# Patient Record
Sex: Female | Born: 1950 | Race: White | Hispanic: No | Smoking: Never smoker
Health system: Southern US, Community
[De-identification: ages and names within clinical notes are randomized; demographics above are authoritative.]

## PROBLEM LIST (undated history)

## (undated) DIAGNOSIS — K589 Irritable bowel syndrome without diarrhea: Secondary | ICD-10-CM

## (undated) DIAGNOSIS — G4730 Sleep apnea, unspecified: Secondary | ICD-10-CM

## (undated) DIAGNOSIS — E039 Hypothyroidism, unspecified: Secondary | ICD-10-CM

## (undated) DIAGNOSIS — R519 Headache, unspecified: Secondary | ICD-10-CM

## (undated) DIAGNOSIS — R053 Chronic cough: Secondary | ICD-10-CM

## (undated) DIAGNOSIS — F329 Major depressive disorder, single episode, unspecified: Secondary | ICD-10-CM

## (undated) DIAGNOSIS — E119 Type 2 diabetes mellitus without complications: Secondary | ICD-10-CM

## (undated) DIAGNOSIS — K219 Gastro-esophageal reflux disease without esophagitis: Secondary | ICD-10-CM

## (undated) DIAGNOSIS — R05 Cough: Secondary | ICD-10-CM

## (undated) DIAGNOSIS — F32A Depression, unspecified: Secondary | ICD-10-CM

## (undated) DIAGNOSIS — R609 Edema, unspecified: Secondary | ICD-10-CM

## (undated) DIAGNOSIS — H9190 Unspecified hearing loss, unspecified ear: Secondary | ICD-10-CM

## (undated) DIAGNOSIS — I10 Essential (primary) hypertension: Secondary | ICD-10-CM

## (undated) DIAGNOSIS — M1990 Unspecified osteoarthritis, unspecified site: Secondary | ICD-10-CM

## (undated) HISTORY — PX: TONSILLECTOMY: SUR1361

## (undated) HISTORY — PX: TUBAL LIGATION: SHX77

## (undated) HISTORY — PX: HAND SURGERY: SHX662

## (undated) HISTORY — PX: GASTRIC BYPASS: SHX52

## (undated) HISTORY — PX: EYE SURGERY: SHX253

## (undated) HISTORY — PX: SHOULDER ARTHROSCOPY: SHX128

---

## 2005-06-07 ENCOUNTER — Emergency Department: Payer: Self-pay | Admitting: Emergency Medicine

## 2009-12-04 ENCOUNTER — Observation Stay: Payer: Self-pay | Admitting: Internal Medicine

## 2014-03-05 ENCOUNTER — Ambulatory Visit (INDEPENDENT_AMBULATORY_CARE_PROVIDER_SITE_OTHER): Payer: Managed Care, Other (non HMO) | Admitting: Podiatry

## 2014-03-05 ENCOUNTER — Encounter: Payer: Self-pay | Admitting: Podiatry

## 2014-03-05 VITALS — BP 114/80 | HR 99 | Resp 16 | Ht 66.0 in | Wt 260.0 lb

## 2014-03-05 DIAGNOSIS — B351 Tinea unguium: Secondary | ICD-10-CM

## 2014-03-05 NOTE — Patient Instructions (Signed)

## 2014-03-05 NOTE — Progress Notes (Signed)
   Subjective:    Patient ID: Kaitlin Kelley, female    DOB: 11-Mar-1951, 63 y.o.   MRN: [id number]  HPI Comments: Patient presents the office today with complaints of fungal toenails on both of her big toes. She states the nails are loose. Fungus his been present for approximately 2 years it has been progressive. She has been using kerasal on the nails. She previously had the nails removed the proximal 2 years ago by a podiatrist in [place]. Denies any drainage, redness or other signs of infection. No other complaints at this time. Denies any claudication symptoms.    Review of Systems  Constitutional: Positive for fatigue.       Sweating   HENT: Positive for ear pain and sinus pressure.        Ringing in ears   Endocrine: Positive for heat intolerance.       Excessive thirst  Genitourinary: Positive for urgency and frequency.  Musculoskeletal:       Joint pain Back pain  Skin:       Open sores  Hematological:       Slow to heal  Psychiatric/Behavioral: The patient is nervous/anxious.        Objective:   Physical Exam AAO x3, NAD DP/PT pulses palpable b/l. CRT < 3 sec Protective sensation intact with the Semmes Weinstein monofilament, vibratory sensation intact. B/L hallux toenails are hypertrophic, dystrophic, elongated, yellow-brown discoloration. There is lifting of the nail plate and nailbed distally. The proximal aspect of the nail plate is adhered. There is no surrounding erythema, drainage or other clinical signs of infection. No open lesions.       Assessment & Plan:  63 year old female with onychomycosis to b/l hallux nails -Conservative versus surgical intervention was discussed with the patient in detail including alternatives, risks, complications. -At this time the patient wishes to have the loose nail debrided and no invasive procedure. -Loose nail sharply debrided to bilateral hallux without complications. Underlying skin intact. Proximal aspect of the nail  adhered. -Various treatments for nail fungus discussed with the patient in detail. Patient wishes to proceed with kerasal.  -Followup as needed or if there are any change in symptoms.

## 2014-03-12 ENCOUNTER — Encounter: Payer: Self-pay | Admitting: Podiatry

## 2014-06-23 ENCOUNTER — Ambulatory Visit: Payer: Self-pay | Admitting: Ophthalmology

## 2014-06-23 LAB — BASIC METABOLIC PANEL
ANION GAP: 9 (ref 7–16)
BUN: 29 mg/dL — ABNORMAL HIGH (ref 7–18)
CO2: 23 mmol/L (ref 21–32)
CREATININE: 1.41 mg/dL — AB (ref 0.60–1.30)
Calcium, Total: 8.9 mg/dL (ref 8.5–10.1)
Chloride: 102 mmol/L (ref 98–107)
EGFR (African American): 48 — ABNORMAL LOW
EGFR (Non-African Amer.): 40 — ABNORMAL LOW
Glucose: 237 mg/dL — ABNORMAL HIGH (ref 65–99)
Osmolality: 282 (ref 275–301)
Potassium: 4.3 mmol/L (ref 3.5–5.1)
SODIUM: 134 mmol/L — AB (ref 136–145)

## 2014-06-24 ENCOUNTER — Ambulatory Visit: Payer: Self-pay | Admitting: Ophthalmology

## 2014-08-14 ENCOUNTER — Ambulatory Visit: Payer: Self-pay | Admitting: Ophthalmology

## 2014-08-14 LAB — POTASSIUM: POTASSIUM: 4 mmol/L (ref 3.5–5.1)

## 2014-08-20 ENCOUNTER — Ambulatory Visit: Payer: Self-pay | Admitting: Ophthalmology

## 2014-09-10 ENCOUNTER — Ambulatory Visit: Payer: Self-pay | Admitting: Ophthalmology

## 2014-10-31 NOTE — Op Note (Signed)
PATIENT NAME:  Kaitlin Kelley, Kaitlin Kelley MR#:  [id number] DATE OF BIRTH:  1950/12/11  DATE OF PROCEDURE:  06/24/2014   PREPROCEDURE DIAGNOSIS: Rhegmatogenous retinal detachment, left eye.  POSTOPERATIVE DIAGNOSIS:  Rhegmatogenous retinal detachment, left eye.    PROCEDURE:  A 25-gauge pars plana vitrectomy with air-fluid exchange endo-laser and 20 626% SF6.  ANESTHESIA:  MAC with retrobulbar block.   COMPLICATIONS: None.   BLOOD LOSS: Minimal from.   PROCEDURE NOTE:  [name] was evaluated in clinic, the day prior to surgery for a rhegmatogenous retinal detachment in the left eye.  The risks, benefits, alternatives, and complications were discussed with her and her husband and she elected to proceed with pars plana vitrectomy in the left eye. On the day of surgery, the patient was greeted in the preoperative holding area. The left eye was marked. Any questions were answered. The patient was brought into the Operating Room in supine position and MAC was administered. A  retrobulbar block of lidocaine and Marcaine plain with Wydase was injected. The left eye was then prepped and draped in usual sterile fashion. Three 25-gauge trocars were placed in the usual position. The infusion cannula was checked to ensure it was in the vitreous cavity prior to starting the infusion. A core vitrectomy was performed followed by peripheral shortening of the vitreous for 360 degrees.  The detachment was very full so care was taken not to engage the  retina during this process the vitreous also removed from the retinal break of which there was one large and multiple small superior breaks. The breaks were then marked and a 360 degree scleral depressed exam was performed. There were no additional breaks noted. The soft tip cannula was then used to drain the subretinal fluid with a fluid-fluid exchange and then with an air-fluid exchange and the detachment was drained flat and Endo laser probe was then used to laser around  all of the detachments and an area previous laser  just inferiorly to the detachment.  Twenty-six percent SF6 was then infused 4 times the vitreous volume. The two superior wounds were then sutured shut with 7-0 Vicryl and the third trocar was removed with no leak of gas. The pressure was felt to be acceptable by palpation. Subconjunctival block and cefuroxime and dexamethasone were injected.  The patient was then patched and shielded with Neo-Poly-Dex ointment and taken to the recovery area in stable condition.    ____________________________ Princella PellegriniJessica D. Duke Salviaandolph, MD jdr:at D: 06/24/2014 08:44:26 ET T: 06/24/2014 14:24:00 ET JOB#: [id number]  cc: Shanda BumpsJessica D. Duke Salviaandolph, MD, <Dictator> Elisabeth Strom D  MD ELECTRONICALLY SIGNED 07/08/2014 10:10

## 2014-11-08 NOTE — Op Note (Signed)
PATIENT NAME:  Kaitlin Kelley, [name] MR#:  [id number] DATE OF BIRTH:  Mar 18, 1951  DATE OF PROCEDURE:  09/10/2014  DIAGNOSIS CODE: Is H25.11, which is a nuclear sclerotic cataract of the right eye.   PREOPERATIVE DIAGNOSIS:  Nuclear sclerotic cataract, right eye.  POSTOPERATIVE DIAGNOSIS:  Nuclear sclerotic cataract, right eye.  PROCEDURE:  Phacoemulsification with posterior chamber intraocular lens right eye, model SN60WF  SURGEON:  Lia HoppingNisha Brendolyn Stockley, MD  INDICATIONS:  This is a 64 year old female with decreased vision in the right eye.  PROCEDURE:  The risks and benefits of cataract surgery were discussed at length with the patient, including bleeding, infection, retinal detachment, re-operation, diplopia, ptosis, loss of vision, and loss of the eye. Informed consent was obtained. On the day of surgery, several sets of preoperative medication were administered to the operative eye including 0.5% tetracaine,1% cyclopentolate, 10% phenylephrine, 0.5% ketorolac, 0.5% gatifloxacin, and 2% lidocaine .  The patient was taken to the operating room and sedated via IV sedation. Topical tetracaine was placed in the eye. The operative eye was prepped using a diluted 10% Betadine solution and then covered in sterile drapes leaving only the operative eye exposed. A Lieberman lid speculum was placed to provide exposure. Using 0.12 forceps and a sideport blade, a paracentesis was created. Then a mixture of BSS, preservative free lidocaine, and epinephrine was injected into the anterior chamber. Next, a 2.4 mm keratome blade was used to create a two-step full-thickness clear corneal incision temporally. The cystitome and Utrata forceps were used to create a continuous capsulorrhexis in the anterior lens capsule. BSS on a hydrodissection cannula was used to perform gentle hydrodissection. Phacoemulsification was then performed to remove the nucleus. Irrigation and aspiration was performed to remove the remaining cortical  material. Provisc was injected to fill the capsular bag and anterior chamber. A 12.0 diopter SN60WF intraocular lens was injected into the capsular bag. The Connor wand was used to rotate it into proper position in the capsular bag. Irrigation and aspiration was performed to remove the remaining Viscoelastic material from the eye. BSS on a 30-gauge cannula was used to hydrate the wound. The main wound was found to be leaking and so, one 10-0 nylon suture was used to reapproximate the main wound. An intracameral antibiotic was administered. The wounds were checked and found to be watertight. The lid speculum and drapes were carefully removed. Several drops of Vigamox were placed in the operative eye. The eye was covered with protective eyewear. The patient was taken to the recovery area in good condition. There were no complications.   ____________________________ Lia HoppingNisha Field Staniszewski, MD nm:nt D: 09/10/2014 11:28:02 ET T: 09/10/2014 20:25:38 ET JOB#: [id number]  cc: Lia HoppingNisha Lucerito Rosinski, MD, <Dictator> Lia HoppingNISHA Henri Baumler MD ELECTRONICALLY SIGNED 09/17/2014 17:13

## 2014-11-08 NOTE — Op Note (Signed)
PATIENT NAME:  Kaitlin Kelley, Kaitlin Kelley MR#:  [id number] DATE OF BIRTH:  04/04/1951  DATE OF PROCEDURE:  08/20/2014  PREOPERATIVE DIAGNOSES:   1. Nuclear sclerotic cataract, left eye; diagnosis code 825.13. 2. Poor visualization.   POSTOPERATIVE DIAGNOSES:   1. Nuclear sclerotic cataract, left eye; diagnosis code 825.13. 2. Poor visualization.   PROCEDURE:  Phacoemulsification with posterior chamber intraocular lens left eye, model SN60WF  ANESTHESIA: Topical.   SURGEON:  Lia HoppingNisha Vennela Jutte, MD  INDICATIONS:  This is a 64 year old female with decreased vision in the left eye.  PROCEDURE:  The risks and benefits of cataract surgery were discussed at length with the patient, including bleeding, infection, retinal detachment, re-operation, diplopia, ptosis, loss of vision, and loss of the eye. Informed consent was obtained. On the day of surgery, several sets of preoperative medication were administered to the operative eye including 0.5% tetracaine,1% cyclopentolate, 10% phenylephrine, 0.5% ketorolac, 0.5% gatifloxacin, and 2% lidocaine .  The patient was taken to the operating room and sedated via IV sedation. Topical tetracaine was placed in the eye. The operative eye was prepped using a diluted 10% Betadine solution and then covered in sterile drapes leaving only the operative eye exposed. A Lieberman lid speculum was placed to provide exposure. Using 0.12 forceps and a sideport blade, a paracentesis was created. Due to poor visualization due to mature cataract with diffuse posterior subcapsular opacities, VisionBlue was then injected into the anterior chamber after a filtered air bubble was injected to ensure adequate visualization of the anterior capsule. Then a mixture of BSS, preservative free lidocaine, and epinephrine was injected into the anterior chamber. Next, a 2.4 mm keratome blade was used to create a two-step full-thickness clear corneal incision temporally. The cystitome and Utrata forceps  were used to create a continuous capsulorrhexis in the anterior lens capsule. BSS on a hydrodissection cannula was used to perform gentle hydrodissection. Phacoemulsification was then performed to remove the nucleus. Irrigation and aspiration was performed to remove the remaining cortical material. Provisc was injected to fill the capsular bag and anterior chamber. A 11.5-diopter SN60WF intraocular lens was injected into the capsular bag. The Connor wand was used to rotate it into proper position in the capsular bag. Irrigation and aspiration was performed to remove the remaining Viscoelastic material from the eye. BSS on a 30-gauge cannula was used to hydrate the wound. An intracameral antibiotic was administered. The wounds were checked and found to be watertight. The lid speculum and drapes were carefully removed. Several drops of Vigamox were placed in the operative eye. The eye was covered with protective eyewear. The patient was taken to the recovery area in good condition. There were no complications.   ____________________________ Lia HoppingNisha Genesee Nase, MD nm:mw D: 08/20/2014 12:07:46 ET T: 08/20/2014 17:04:51 ET JOB#: [id number]  cc: Lia HoppingNisha Lotus Santillo, MD, <Dictator> Lia HoppingNISHA Jered Heiny MD ELECTRONICALLY SIGNED 08/24/2014 8:55

## 2015-03-11 ENCOUNTER — Encounter: Payer: Self-pay | Admitting: *Deleted

## 2015-03-17 ENCOUNTER — Encounter
Admission: RE | Disposition: A | Discharge status: Home / Self Care | Payer: Self-pay | Source: Ambulatory Visit | Attending: Ophthalmology

## 2015-03-17 ENCOUNTER — Ambulatory Visit: Payer: Managed Care, Other (non HMO) | Admitting: Anesthesiology

## 2015-03-17 ENCOUNTER — Ambulatory Visit
Admission: RE | Admit: 2015-03-17 | Discharge: 2015-03-17 | Disposition: A | Discharge status: Home / Self Care | Payer: Managed Care, Other (non HMO) | Source: Ambulatory Visit | Attending: Ophthalmology | Admitting: Ophthalmology

## 2015-03-17 ENCOUNTER — Encounter: Payer: Self-pay | Admitting: *Deleted

## 2015-03-17 DIAGNOSIS — M1990 Unspecified osteoarthritis, unspecified site: Secondary | ICD-10-CM | POA: Diagnosis not present

## 2015-03-17 DIAGNOSIS — E039 Hypothyroidism, unspecified: Secondary | ICD-10-CM | POA: Insufficient documentation

## 2015-03-17 DIAGNOSIS — K219 Gastro-esophageal reflux disease without esophagitis: Secondary | ICD-10-CM | POA: Insufficient documentation

## 2015-03-17 DIAGNOSIS — H3322 Serous retinal detachment, left eye: Secondary | ICD-10-CM | POA: Diagnosis not present

## 2015-03-17 DIAGNOSIS — G4730 Sleep apnea, unspecified: Secondary | ICD-10-CM | POA: Insufficient documentation

## 2015-03-17 DIAGNOSIS — I10 Essential (primary) hypertension: Secondary | ICD-10-CM | POA: Diagnosis not present

## 2015-03-17 DIAGNOSIS — E119 Type 2 diabetes mellitus without complications: Secondary | ICD-10-CM | POA: Insufficient documentation

## 2015-03-17 DIAGNOSIS — F329 Major depressive disorder, single episode, unspecified: Secondary | ICD-10-CM | POA: Diagnosis not present

## 2015-03-17 DIAGNOSIS — R05 Cough: Secondary | ICD-10-CM | POA: Insufficient documentation

## 2015-03-17 DIAGNOSIS — Z794 Long term (current) use of insulin: Secondary | ICD-10-CM | POA: Insufficient documentation

## 2015-03-17 DIAGNOSIS — R609 Edema, unspecified: Secondary | ICD-10-CM | POA: Insufficient documentation

## 2015-03-17 DIAGNOSIS — K589 Irritable bowel syndrome without diarrhea: Secondary | ICD-10-CM | POA: Insufficient documentation

## 2015-03-17 HISTORY — DX: Chronic cough: R05.3

## 2015-03-17 HISTORY — DX: Type 2 diabetes mellitus without complications: E11.9

## 2015-03-17 HISTORY — DX: Unspecified hearing loss, unspecified ear: H91.90

## 2015-03-17 HISTORY — PX: GAS INSERTION: SHX5336

## 2015-03-17 HISTORY — DX: Sleep apnea, unspecified: G47.30

## 2015-03-17 HISTORY — DX: Depression, unspecified: F32.A

## 2015-03-17 HISTORY — DX: Hypothyroidism, unspecified: E03.9

## 2015-03-17 HISTORY — DX: Unspecified osteoarthritis, unspecified site: M19.90

## 2015-03-17 HISTORY — DX: Cough: R05

## 2015-03-17 HISTORY — DX: Edema, unspecified: R60.9

## 2015-03-17 HISTORY — DX: Irritable bowel syndrome, unspecified: K58.9

## 2015-03-17 HISTORY — DX: Major depressive disorder, single episode, unspecified: F32.9

## 2015-03-17 HISTORY — PX: PARS PLANA VITRECTOMY: SHX2166

## 2015-03-17 HISTORY — DX: Essential (primary) hypertension: I10

## 2015-03-17 HISTORY — DX: Gastro-esophageal reflux disease without esophagitis: K21.9

## 2015-03-17 LAB — GLUCOSE, CAPILLARY
GLUCOSE-CAPILLARY: 184 mg/dL — AB (ref 65–99)
Glucose-Capillary: 214 mg/dL — ABNORMAL HIGH (ref 65–99)

## 2015-03-17 SURGERY — PARS PLANA VITRECTOMY WITH 25 GAUGE
Anesthesia: Monitor Anesthesia Care | Site: Eye | Laterality: Left | Wound class: Clean

## 2015-03-17 MED ORDER — HYALURONIDASE HUMAN 150 UNIT/ML IJ SOLN
INTRAMUSCULAR | Status: AC
  Filled 2015-03-17: qty 1

## 2015-03-17 MED ORDER — PHENYLEPHRINE HCL 10 % OP SOLN
OPHTHALMIC | Status: AC
  Filled 2015-03-17: qty 5

## 2015-03-17 MED ORDER — CYCLOPENTOLATE HCL 2 % OP SOLN
OPHTHALMIC | Status: AC
  Filled 2015-03-17: qty 2

## 2015-03-17 MED ORDER — HYPROMELLOSE 0.3 % OP GEL
OPHTHALMIC | Status: AC
  Filled 2015-03-17: qty 3.5

## 2015-03-17 MED ORDER — MIDAZOLAM HCL 5 MG/5ML IJ SOLN
INTRAMUSCULAR | Status: DC | PRN
  Administered 2015-03-17: 1 mg via INTRAVENOUS

## 2015-03-17 MED ORDER — DEXTROSE 50 % IV SOLN
INTRAVENOUS | Status: AC
  Filled 2015-03-17: qty 50

## 2015-03-17 MED ORDER — BUPIVACAINE HCL (PF) 0.75 % IJ SOLN
INTRAMUSCULAR | Status: AC
  Filled 2015-03-17: qty 10

## 2015-03-17 MED ORDER — ALFENTANIL 500 MCG/ML IJ INJ
INJECTION | INTRAMUSCULAR | Status: DC | PRN
Start: 2015-03-17 — End: 2015-03-17
  Administered 2015-03-17: 500 ug via INTRAVENOUS

## 2015-03-17 MED ORDER — SODIUM CHLORIDE 0.9 % IV SOLN
INTRAVENOUS | Status: DC
  Administered 2015-03-17 (×2): via INTRAVENOUS

## 2015-03-17 MED ORDER — CYCLOPENTOLATE HCL 2 % OP SOLN
1.0000 [drp] | OPHTHALMIC | Status: DC | PRN
  Administered 2015-03-17: 1 [drp] via OPHTHALMIC

## 2015-03-17 MED ORDER — BSS PLUS IO SOLN: Freq: Once | INTRAOCULAR | Status: DC

## 2015-03-17 MED ORDER — NEOMYCIN-POLYMYXIN-DEXAMETH 0.1 % OP OINT
TOPICAL_OINTMENT | OPHTHALMIC | Status: DC | PRN
  Administered 2015-03-17: 1 via OPHTHALMIC

## 2015-03-17 MED ORDER — LIDOCAINE HCL (PF) 4 % IJ SOLN
INTRAMUSCULAR | Status: DC | PRN
  Administered 2015-03-17: 08:00:00 via OPHTHALMIC

## 2015-03-17 MED ORDER — FENTANYL CITRATE (PF) 100 MCG/2ML IJ SOLN: 25.0000 ug | INTRAMUSCULAR | Status: DC | PRN

## 2015-03-17 MED ORDER — CEFUROXIME OPHTHALMIC INJECTION 1 MG/0.1 ML
INJECTION | OPHTHALMIC | Status: DC | PRN
  Administered 2015-03-17: 0.1 mL via INTRACAMERAL

## 2015-03-17 MED ORDER — NEOMYCIN-POLYMYXIN-DEXAMETH 3.5-10000-0.1 OP OINT
TOPICAL_OINTMENT | OPHTHALMIC | Status: AC
  Filled 2015-03-17: qty 3.5

## 2015-03-17 MED ORDER — CEFUROXIME OPHTHALMIC INJECTION 1 MG/0.1 ML
INJECTION | OPHTHALMIC | Status: AC
  Filled 2015-03-17: qty 0.1

## 2015-03-17 MED ORDER — BSS PLUS IO SOLN
INTRAOCULAR | Status: DC | PRN
  Administered 2015-03-17: 1 via INTRAOCULAR

## 2015-03-17 MED ORDER — TETRACAINE HCL 0.5 % OP SOLN
OPHTHALMIC | Status: DC | PRN
  Administered 2015-03-17: 2 [drp] via OPHTHALMIC

## 2015-03-17 MED ORDER — TETRACAINE HCL 0.5 % OP SOLN
OPHTHALMIC | Status: AC
  Filled 2015-03-17: qty 2

## 2015-03-17 MED ORDER — HYPROMELLOSE 0.3 % OP GEL
OPHTHALMIC | Status: DC | PRN
  Administered 2015-03-17: 1 via OPHTHALMIC

## 2015-03-17 MED ORDER — TRIAMCINOLONE ACETONIDE 40 MG/ML IJ SUSP
INTRAMUSCULAR | Status: AC
  Filled 2015-03-17: qty 1

## 2015-03-17 MED ORDER — DEXAMETHASONE SODIUM PHOSPHATE 10 MG/ML IJ SOLN
INTRAMUSCULAR | Status: AC
  Filled 2015-03-17: qty 1

## 2015-03-17 MED ORDER — OXYCODONE HCL 5 MG/5ML PO SOLN
5.0000 mg | Freq: Once | ORAL | Status: DC | PRN
Start: 2015-03-17 — End: 2015-03-17

## 2015-03-17 MED ORDER — ATROPINE SULFATE 1 % OP SOLN
OPHTHALMIC | Status: DC | PRN
  Administered 2015-03-17: 2 [drp] via OPHTHALMIC

## 2015-03-17 MED ORDER — LIDOCAINE HCL (PF) 4 % IJ SOLN
INTRAMUSCULAR | Status: AC
  Filled 2015-03-17: qty 5

## 2015-03-17 MED ORDER — OXYCODONE HCL 5 MG PO TABS: 5.0000 mg | ORAL_TABLET | Freq: Once | ORAL | Status: DC | PRN

## 2015-03-17 MED ORDER — DEXAMETHASONE SODIUM PHOSPHATE 10 MG/ML IJ SOLN
INTRAMUSCULAR | Status: DC | PRN
  Administered 2015-03-17: 10 mg

## 2015-03-17 MED ORDER — ATROPINE SULFATE 1 % OP SOLN
OPHTHALMIC | Status: AC
  Filled 2015-03-17: qty 5

## 2015-03-17 MED ORDER — PHENYLEPHRINE HCL 10 % OP SOLN
1.0000 [drp] | OPHTHALMIC | Status: DC | PRN
  Administered 2015-03-17: 1 [drp] via OPHTHALMIC

## 2015-03-17 SURGICAL SUPPLY — 35 items
APPLICATOR COTTON TIP 6IN STRL (MISCELLANEOUS) ×8 IMPLANT
C3F8 GAS TANK IMPLANT
CANNULA SOFT TIP 25G (CANNULA) ×2 IMPLANT
CORD BIP STRL DISP 12FT (MISCELLANEOUS) ×2 IMPLANT
CUP MEDICINE 2OZ PLAST GRAD ST (MISCELLANEOUS) ×2 IMPLANT
ERASER HMR WETFIELD 25G (MISCELLANEOUS) ×2 IMPLANT
FILTER MILLEX .045 (MISCELLANEOUS) ×2 IMPLANT
FLTR MILLEX .045 (MISCELLANEOUS) ×4
FORCEPS GRIESH GRASP 25G (INSTRUMENTS) IMPLANT
FORCEPS GRIESH ILM PLUS 25G (INSTRUMENTS) ×2 IMPLANT
GLOVE BIO SURGEON STRL SZ8 (GLOVE) ×2 IMPLANT
GLOVE SURG LX 6.5 MICRO (GLOVE) ×1
GLOVE SURG LX STRL 6.5 MICRO (GLOVE) ×1 IMPLANT
GOWN STRL REUS W/ TWL LRG LVL3 (GOWN DISPOSABLE) ×2 IMPLANT
GOWN STRL REUS W/TWL LRG LVL3 (GOWN DISPOSABLE) ×2
IV SET STOPCOCK EXT 40 2IN (SET/KITS/TRAYS/PACK) ×2 IMPLANT
LENS BIOM OPTIC SET 200MM DISP (MISCELLANEOUS) ×2 IMPLANT
LENS VITRECTOMY FLAT DISP (MISCELLANEOUS) IMPLANT
NDL RETROBULBAR .5 NSTRL (NEEDLE) ×2 IMPLANT
NEEDLE FILTER BLUNT 18X 1/2SAF (NEEDLE) ×1
NEEDLE FILTER BLUNT 18X1 1/2 (NEEDLE) ×1 IMPLANT
NEEDLE HYPO 30X.5 LL (NEEDLE) ×2 IMPLANT
PACK EYE AFTER SURG (MISCELLANEOUS) ×2 IMPLANT
PACK VITRECTOMY (MISCELLANEOUS) ×2 IMPLANT
PACK VITRECTOMY CASSETTE 25GA (MISCELLANEOUS) ×2 IMPLANT
PROBE DIRECTIONAL LASER (MISCELLANEOUS) ×2 IMPLANT
PROBE LASER ILLUM FLEX CVD 25G (OPHTHALMIC) IMPLANT
SOL ANTI-FOG 6CC FOG-OUT (MISCELLANEOUS) ×1 IMPLANT
SOL FOG-OUT ANTI-FOG 6CC (MISCELLANEOUS) ×1
SOL PREP PVP 2OZ (MISCELLANEOUS) ×2
SOLUTION PREP PVP 2OZ (MISCELLANEOUS) ×1 IMPLANT
STRAP SAFETY BODY (MISCELLANEOUS) ×2 IMPLANT
SYR 50ML LL SCALE MARK (SYRINGE) ×2 IMPLANT
SYR TB 1ML 27GX1/2 LL (SYRINGE) ×2 IMPLANT
SYRINGE 10CC LL (SYRINGE) ×2 IMPLANT

## 2015-03-17 NOTE — Op Note (Signed)
.  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             POST OPERATIVE DIAGNOSIS: PVR Retinal detachment, left eye.                       OPERATION PERFORMED: 25-gauge pars plana vitrectomy, membrane peeling, air fluid exchange, endolaser and 14% C3F8 left eye.                     ANESTHESIA: MAC with peribulbar block.   COMPLICATIONS: None.     BLOOD LOSS: Minimal.   SPECIMEN: None.   DESCRIPTION OF PROCEDURE: Patient was evaluated in the clinic for a macula nn retinal detachment left eye.  Patient has a history of prior retinal detachment eye in the same eye repaired with pars plana vitrectomy months ago. She presented with a new break and inferior PVR membranes. Risks, benefits, alternatives and complications were discussed, and patient elected to proceed with PPV retinal detachment repair.  On the day of surgery, the patient was greeted in the preoperative holding area.  All questions were answered and the left eye was marked.  The patient was then taken into the operating room in the supine position.    Monitored anesthesia care was administered and Next, 5 ml of a retrobulbar block consisting of 4% xylocaine plain, 0.75% sensorcaine plain and hylenex 100u was injected.  The left eye was then prepped and draped in the usual sterile fashion.  Three 25-gauge trocars were placed in the usual positions inferotemporal, superotemporal and superonasal. The infusion cannula was checked to make sure it was in the vitreous cavity prior to starting the infusion.  The core gel was removed on a prior vitrectomy, so a careful removal of the peripheral vitreous skirt for 360 degrees took place. PVR membranes were noted inferiorly, with a stretch break adjacent to a membrane at the 4 o clock position. ILM forceps were then used to engage and remove the tractional membrane. During this process  other breaks were noted. All breaks were then marked using endocautery.  A fluid-fluid exchange was performed to remove the thickened subretinal fluid.  Then air infusion took place and the remaining subretinal fluid was drained through the break.  Fourteen percent C3F8 was infused 4 times the vitreous volume and the trocars were removed.  All were airtight. The pressure was acceptable by palpation.  Sub conjunctival cefuroxime and dexamethasone were injected and the eye was patched with neo-poly-dex ointment and shielded. The patient was taken to the recovery area in stable condition.

## 2015-03-17 NOTE — Anesthesia Postprocedure Evaluation (Signed)
  Anesthesia Post-op Note  Patient: Kaitlin Kelley  Procedure(s) Performed: Procedure(s) with comments: PARS PLANA VITRECTOMY WITH 25 GAUGE (Left) INSERTION OF GAS (Left) - C54f8  Anesthesia type:MAC  Patient location: PACU  Post pain: Pain level controlled  Post assessment: Post-op Vital signs reviewed, Patient's Cardiovascular Status Stable, Respiratory Function Stable, Patent Airway and No signs of Nausea or vomiting  Post vital signs: Reviewed and stable  Last Vitals:  Filed Vitals:   03/17/15 1014  BP: 155/63  Pulse: 65  Temp: 36.2 C  Resp: 14    Level of consciousness: awake, alert  and patient cooperative  Complications: No apparent anesthesia complications

## 2015-03-17 NOTE — Transfer of Care (Signed)
Immediate Anesthesia Transfer of Care Note  Patient: Kaitlin Kelley  Procedure(s) Performed: Procedure(s) with comments: PARS PLANA VITRECTOMY WITH 25 GAUGE (Left) INSERTION OF GAS (Left) - C42f8  Patient Location: PACU  Anesthesia Type:MAC  Level of Consciousness: awake, alert , oriented and patient cooperative  Airway & Oxygen Therapy: Patient Spontanous Breathing  Post-op Assessment: Report given to RN, Post -op Vital signs reviewed and stable and Patient moving all extremities X 4  Post vital signs: Reviewed and stable  Last Vitals:  Filed Vitals:   03/17/15 0850  BP: 126/51  Pulse: 59  Temp: 36.1 C  Resp: 16    Complications: No apparent anesthesia complications

## 2015-03-17 NOTE — H&P (Signed)
.  Previous H&P scanned in reviewed, patient examined, and no interval changes.  Please see scanned record for complete information.   

## 2015-03-17 NOTE — Anesthesia Preprocedure Evaluation (Signed)
Anesthesia Evaluation  Patient identified by MRN, date of birth, ID band Patient awake    Reviewed: Allergy & Precautions, H&P , NPO status , Patient's Chart, lab work & pertinent test results  History of Anesthesia Complications Negative for: history of anesthetic complications  Airway Mallampati: III  TM Distance: >3 FB Neck ROM: limited    Dental no notable dental hx. (+) Teeth Intact   Pulmonary neg shortness of breath, sleep apnea ,    Pulmonary exam normal breath sounds clear to auscultation       Cardiovascular Exercise Tolerance: Good hypertension, Normal cardiovascular exam Rhythm:regular Rate:Normal     Neuro/Psych PSYCHIATRIC DISORDERS Depression negative neurological ROS  negative psych ROS   GI/Hepatic Neg liver ROS, GERD  Controlled,  Endo/Other  diabetes, Type 2, Insulin DependentHypothyroidism   Renal/GU negative Renal ROS  negative genitourinary   Musculoskeletal  (+) Arthritis ,   Abdominal   Peds  Hematology negative hematology ROS (+)   Anesthesia Other Findings Past Medical History:   Sleep apnea                                                    Comment:CPAP   Hypertension                                                 GERD (gastroesophageal reflux disease)                       IBS (irritable bowel syndrome)                               Arthritis                                                    Diabetes mellitus without complication                       Depression                                                   Hypothyroidism                                               HOH (hard of hearing)                                        Chronic cough  Edema                                                          Comment:LEGS/FEET   Reproductive/Obstetrics negative OB ROS                             Anesthesia  Physical Anesthesia Plan  ASA: III  Anesthesia Plan: MAC   Post-op Pain Management:    Induction:   Airway Management Planned:   Additional Equipment:   Intra-op Plan:   Post-operative Plan:   Informed Consent: I have reviewed the patients History and Physical, chart, labs and discussed the procedure including the risks, benefits and alternatives for the proposed anesthesia with the patient or authorized representative who has indicated his/her understanding and acceptance.   Dental Advisory Given  Plan Discussed with: Anesthesiologist, CRNA and Surgeon  Anesthesia Plan Comments:         Anesthesia Quick Evaluation

## 2015-05-02 ENCOUNTER — Emergency Department
Admission: EM | Admit: 2015-05-02 | Discharge: 2015-05-03 | Disposition: A | Discharge status: Home / Self Care | Payer: Managed Care, Other (non HMO) | Attending: Emergency Medicine | Admitting: Emergency Medicine

## 2015-05-02 DIAGNOSIS — E119 Type 2 diabetes mellitus without complications: Secondary | ICD-10-CM | POA: Insufficient documentation

## 2015-05-02 DIAGNOSIS — I10 Essential (primary) hypertension: Secondary | ICD-10-CM | POA: Diagnosis not present

## 2015-05-02 DIAGNOSIS — R112 Nausea with vomiting, unspecified: Secondary | ICD-10-CM | POA: Diagnosis not present

## 2015-05-02 DIAGNOSIS — Z7982 Long term (current) use of aspirin: Secondary | ICD-10-CM | POA: Diagnosis not present

## 2015-05-02 DIAGNOSIS — Z794 Long term (current) use of insulin: Secondary | ICD-10-CM | POA: Insufficient documentation

## 2015-05-02 DIAGNOSIS — R1030 Lower abdominal pain, unspecified: Secondary | ICD-10-CM

## 2015-05-02 DIAGNOSIS — Z79899 Other long term (current) drug therapy: Secondary | ICD-10-CM | POA: Diagnosis not present

## 2015-05-02 DIAGNOSIS — M6281 Muscle weakness (generalized): Secondary | ICD-10-CM | POA: Diagnosis not present

## 2015-05-02 DIAGNOSIS — R55 Syncope and collapse: Secondary | ICD-10-CM | POA: Diagnosis not present

## 2015-05-02 DIAGNOSIS — Z791 Long term (current) use of non-steroidal anti-inflammatories (NSAID): Secondary | ICD-10-CM | POA: Diagnosis not present

## 2015-05-02 LAB — URINALYSIS COMPLETE WITH MICROSCOPIC (ARMC ONLY)
BILIRUBIN URINE: NEGATIVE
GLUCOSE, UA: NEGATIVE mg/dL
Hgb urine dipstick: NEGATIVE
Ketones, ur: NEGATIVE mg/dL
Nitrite: NEGATIVE
Protein, ur: NEGATIVE mg/dL
RBC / HPF: NONE SEEN RBC/hpf (ref 0–5)
Specific Gravity, Urine: 1.01 (ref 1.005–1.030)
pH: 5 (ref 5.0–8.0)

## 2015-05-02 LAB — COMPREHENSIVE METABOLIC PANEL
ALK PHOS: 99 U/L (ref 38–126)
ALT: 43 U/L (ref 14–54)
ANION GAP: 9 (ref 5–15)
AST: 54 U/L — ABNORMAL HIGH (ref 15–41)
Albumin: 3.9 g/dL (ref 3.5–5.0)
BUN: 30 mg/dL — ABNORMAL HIGH (ref 6–20)
CALCIUM: 8.7 mg/dL — AB (ref 8.9–10.3)
CO2: 23 mmol/L (ref 22–32)
CREATININE: 1.64 mg/dL — AB (ref 0.44–1.00)
Chloride: 105 mmol/L (ref 101–111)
GFR, EST AFRICAN AMERICAN: 37 mL/min — AB (ref >60)
GFR, EST NON AFRICAN AMERICAN: 32 mL/min — AB (ref >60)
Glucose, Bld: 214 mg/dL — ABNORMAL HIGH (ref 65–99)
Potassium: 4.9 mmol/L (ref 3.5–5.1)
Sodium: 137 mmol/L (ref 135–145)
Total Bilirubin: 0.9 mg/dL (ref 0.3–1.2)
Total Protein: 7.2 g/dL (ref 6.5–8.1)

## 2015-05-02 LAB — CBC WITH DIFFERENTIAL/PLATELET
Basophils Absolute: 0.1 10*3/uL (ref 0–0.1)
Basophils Relative: 1 %
EOS PCT: 2 %
Eosinophils Absolute: 0.3 10*3/uL (ref 0–0.7)
HCT: 46.4 % (ref 35.0–47.0)
Hemoglobin: 15.5 g/dL (ref 12.0–16.0)
LYMPHS ABS: 2.6 10*3/uL (ref 1.0–3.6)
LYMPHS PCT: 15 %
MCH: 31.1 pg (ref 26.0–34.0)
MCHC: 33.3 g/dL (ref 32.0–36.0)
MCV: 93.3 fL (ref 80.0–100.0)
MONOS PCT: 3 %
Monocytes Absolute: 0.5 10*3/uL (ref 0.2–0.9)
Neutro Abs: 13.8 10*3/uL — ABNORMAL HIGH (ref 1.4–6.5)
Neutrophils Relative %: 79 %
PLATELETS: 399 10*3/uL (ref 150–440)
RBC: 4.98 MIL/uL (ref 3.80–5.20)
RDW: 13.3 % (ref 11.5–14.5)
WBC: 17.3 10*3/uL — AB (ref 3.6–11.0)

## 2015-05-02 LAB — LIPASE, BLOOD: LIPASE: 33 U/L (ref 11–51)

## 2015-05-02 MED ORDER — OXYCODONE-ACETAMINOPHEN 5-325 MG PO TABS: 1.0000 | ORAL_TABLET | Freq: Four times a day (QID) | ORAL | Status: DC | PRN

## 2015-05-02 MED ORDER — ONDANSETRON HCL 4 MG/2ML IJ SOLN: 4.0000 mg | Freq: Once | INTRAMUSCULAR | Status: DC

## 2015-05-02 MED ORDER — SODIUM CHLORIDE 0.9 % IV BOLUS (SEPSIS)
1000.0000 mL | Freq: Once | INTRAVENOUS | Status: AC
  Administered 2015-05-02: 1000 mL via INTRAVENOUS

## 2015-05-02 MED ORDER — OXYCODONE-ACETAMINOPHEN 5-325 MG PO TABS
1.0000 | ORAL_TABLET | Freq: Once | ORAL | Status: AC
  Administered 2015-05-02: 1 via ORAL
  Filled 2015-05-02: qty 1

## 2015-05-02 MED ORDER — DICYCLOMINE HCL 10 MG PO CAPS
10.0000 mg | ORAL_CAPSULE | Freq: Once | ORAL | Status: AC
  Administered 2015-05-02: 10 mg via ORAL
  Filled 2015-05-02: qty 1

## 2015-05-02 MED ORDER — DICYCLOMINE HCL 20 MG PO TABS: 20.0000 mg | ORAL_TABLET | Freq: Three times a day (TID) | ORAL | Status: DC | PRN

## 2015-05-02 NOTE — ED Notes (Addendum)
Pt arrives to ED via [facility] for c/o abdominal pain, nausea, vomiting, and weakness. Per EMS, pt was ambulating to the BR after not feeling well, felt weak, and lowered herself to the floor to prevent falling. EMS reports hypotension and tachycardia upon their arrival. Pt reports constant nausea, but no vomiting. Pt reports 1 episode of diarrhea (pt reports her stools went from "soft to liquid"). Pt arrives A&O, in NAD, respirations even, regular and unlabored, with s/o at bedside. Pt has received approximately of NS en route via PIV.

## 2015-05-02 NOTE — ED Notes (Signed)
Pt given a cherry popsicle for PO challenge. Pt informed that Zofran has been ordered for nausea/vomiting, if needed (pt denies needing it/declines taking it at this time).

## 2015-05-02 NOTE — ED Notes (Signed)
30 mins post PO intake of popsicle: pt denies any s/s's or s/x's of nausea or vomiting at this time. Pt is currently up the the BR to provide urine specimen for UA.

## 2015-05-02 NOTE — ED Notes (Addendum)
500 mL bolus of NS started by EMS completed.

## 2015-05-02 NOTE — ED Provider Notes (Addendum)
[facility] Emergency Department Provider Note  ____________________________________________  Time seen: 10:10 PM  I have reviewed the triage vital signs and the nursing notes.   HISTORY  Chief Complaint Abdominal Pain    HPI Kaitlin Kelley is a 64 y.o. female who complains of suprapubic abdominal pain nausea vomiting and generalized weakness today. The abdominal pain radiates into her lower back. No aggravating or alleviating factors, achy and moderate in intensity. Waxing and waning course. She does report urinary frequency and some loss of urinary control as well. She was feeling so weak that she couldn't get off the toilet and her husband felt that she had passed out. EMS were called and they felt that she is hypotensive and tachycardic on arrival so they lowered her down to the ground. Patient did have one episode of loose bowel movements as well. During transport to the ED with IV fluids, the patient is feeling much better and vital signs appear to have normalized.  No fever chills chest pain shortness of breath headache numbness tingling or weakness. Patient does report some preceding sore throat and nasal congestion over the past few days.   Past Medical History  Diagnosis Date  . Sleep apnea     CPAP  . Hypertension   . GERD (gastroesophageal reflux disease)   . IBS (irritable bowel syndrome)   . Arthritis   . Diabetes mellitus without complication (HCC)   . Depression   . Hypothyroidism   . HOH (hard of hearing)   . Chronic cough   . Edema     LEGS/FEET     There are no active problems to display for this patient.    Past Surgical History  Procedure Laterality Date  . Eye surgery    . Tonsillectomy    . Tubal ligation    . Shoulder arthroscopy    . Pars plana vitrectomy Left 03/17/2015    Procedure: PARS PLANA VITRECTOMY WITH 25 GAUGE;  Surgeon: Marcelene Butte, MD;  Location: [facility] ORS;  Service: Ophthalmology;  Laterality: Left;  .  Gas insertion Left 03/17/2015    Procedure: INSERTION OF GAS;  Surgeon: Marcelene Butte, MD;  Location: [facility] ORS;  Service: Ophthalmology;  Laterality: Left;  [id number]     Current Outpatient Rx  Name  Route  Sig  Dispense  Refill  . ASPIRIN PO   Oral   Take by mouth.         . BuPROPion HCl (WELLBUTRIN PO)   Oral   Take 100 mg by mouth 2 (two) times daily.          . diclofenac (VOLTAREN) 50 MG EC tablet   Oral   Take 50 mg by mouth 2 (two) times daily.         . DULoxetine HCl (CYMBALTA PO)   Oral   Take 60 mg by mouth daily.          . fluticasone (FLONASE) 50 MCG/ACT nasal spray   Each Nare   Place 1 spray into both nostrils daily. AS NEEDED         . GLYBURIDE PO   Oral   Take 5 mg by mouth 2 (two) times daily.          . Insulin Glargine (LANTUS [place])   Subcutaneous   Inject into the skin at bedtime.          Marland Kitchen levothyroxine (SYNTHROID, LEVOTHROID) 88 MCG tablet   Oral   Take 88 mcg by mouth daily  before breakfast.         . Liraglutide (VICTOZA [place])   Subcutaneous   Inject into the skin every morning.          Marland Kitchen. lisinopril (PRINIVIL,ZESTRIL) 10 MG tablet   Oral   Take 10 mg by mouth daily. HS         . Multiple Vitamins-Minerals (MULTIVITAMIN PO)   Oral   Take by mouth.         Marland Kitchen. SIMVASTATIN PO   Oral   Take 40 mg by mouth at bedtime.             Allergies Review of patient's allergies indicates no known allergies.   No family history on file.  Social History Social History  Substance Use Topics  . Smoking status: Never Smoker   . Smokeless tobacco: None  . Alcohol Use: No    Review of Systems  Constitutional:   No fever or chills. No weight changes Eyes:   No blurry vision or double vision.  ENT:   No sore throat. Cardiovascular:   No chest pain. Respiratory:   No dyspnea or cough. Gastrointestinal:  Positive for abdominal pain and vomiting and loose bowel movements as above. No BRBPR or melena. Genitourinary:    Negative for dysuria, urinary retention, bloody urine, or difficulty urinating. She does have frequency and some loss of control. Denies vaginal bleeding or discharge Musculoskeletal:   Negative for back pain. No joint swelling or pain. Skin:   Negative for rash. Neurological:   Negative for headaches, focal weakness or numbness. Psychiatric:  No anxiety or depression.   Endocrine:  No hot/cold intolerance, changes in energy, or sleep difficulty.  10-point ROS otherwise negative.  ____________________________________________   PHYSICAL EXAM:  VITAL SIGNS: ED Triage Vitals  Enc Vitals Group     BP 05/02/15 2059 119/70 mmHg     Pulse Rate 05/02/15 2059 103     Resp 05/02/15 2059 14     Temp 05/02/15 2059 97.5 F (36.4 C)     Temp Source 05/02/15 2059 Oral     SpO2 05/02/15 2059 97 %     Weight 05/02/15 2059 256 lb (116.121 kg)     Height 05/02/15 2059 5\' 6"  (1.676 m)     Head Cir --      Peak Flow --      Pain Score 05/02/15 2105 2     Pain Loc --      Pain Edu? --      Excl. in GC? --      Constitutional:   Alert and oriented. Well appearing and in no distress. Eyes:   No scleral icterus. No conjunctival pallor. PERRL. EOMI ENT   Head:   Normocephalic and atraumatic.   Nose:   No congestion/rhinnorhea. No septal hematoma   Mouth/Throat:   MMM, no pharyngeal erythema. No peritonsillar mass. No uvula shift.   Neck:   No stridor. No SubQ emphysema. No meningismus. Hematological/Lymphatic/Immunilogical:   No cervical lymphadenopathy. Cardiovascular:   RRR. Normal and symmetric distal pulses are present in all extremities. No murmurs, rubs, or gallops. Respiratory:   Normal respiratory effort without tachypnea nor retractions. Breath sounds are clear and equal bilaterally. No wheezes/rales/rhonchi. Gastrointestinal:   Soft with suprapubic tenderness. No distention. There is no CVA tenderness.  No rebound, rigidity, or guarding. Genitourinary:    deferred Musculoskeletal:   Nontender with normal range of motion in all extremities. No joint effusions.  No lower extremity tenderness.  No edema. Neurologic:   Normal speech and language.  CN 2-10 normal. Motor grossly intact. No pronator drift.  Normal gait. No gross focal neurologic deficits are appreciated.  Skin:    Skin is warm, dry and intact. No rash noted.  No petechiae, purpura, or bullae. Psychiatric:   Mood and affect are normal. Speech and behavior are normal. Patient exhibits appropriate insight and judgment.  ____________________________________________    LABS (pertinent positives/negatives) (all labs ordered are listed, but only abnormal results are displayed) Labs Reviewed  CBC WITH DIFFERENTIAL/PLATELET - Abnormal; Notable for the following:    WBC 17.3 (*)    Neutro Abs 13.8 (*)    All other components within normal limits  COMPREHENSIVE METABOLIC PANEL - Abnormal; Notable for the following:    Glucose, Bld 214 (*)    BUN 30 (*)    Creatinine, Ser 1.64 (*)    Calcium 8.7 (*)    AST 54 (*)    GFR calc non Af Amer 32 (*)    GFR calc Af Amer 37 (*)    All other components within normal limits  LIPASE, BLOOD  URINALYSIS COMPLETEWITH MICROSCOPIC ([facility] ONLY)   ____________________________________________   EKG  Interpreted by me  Date: 05/02/2015  Rate: 90  Rhythm: normal sinus rhythm  QRS Axis: normal  Intervals: normal  ST/T Wave abnormalities: normal  Conduction Disutrbances: none  Narrative Interpretation: unremarkable      ____________________________________________    RADIOLOGY    ____________________________________________   PROCEDURES   ____________________________________________   INITIAL IMPRESSION / ASSESSMENT AND PLAN / ED COURSE  Pertinent labs & imaging results that were available during my care of the patient were reviewed by me and considered in my medical decision making (see chart for details).  Patient  presents with suprapubic abdominal pain and concern for cystitis and urinary tract infection. Additionally she may be having a viral syndrome and the syncope appears be related to a vagal episode. I have low suspicion for hypovolemia, shock, sepsis. Vital signs are normal in the ED on arrival except for very mild tachycardia with a heart rate of 100-105. Continue IV fluids while getting labs and urinalysis.  ----------------------------------------- 11:44 PM on 05/02/2015 -----------------------------------------  Urinalysis is resulted which is unremarkable. Vital signs are stable and normal. Tachycardia has resolved with IV fluids and pulse is now 80. Patient is tolerating oral intake and comfortable. We'll keep her on pain medications and have her follow-up. At this point I think that she is having a viral syndrome which should be a self-limited illness. We'll have her follow up with primary care in 2 days for continued monitoring of her symptoms.   ____________________________________________   FINAL CLINICAL IMPRESSION(S) / ED DIAGNOSES  Final diagnoses:  Suprapubic pain, acute, unspecified laterality  Vaso vagal episode      Sharman Cheek, MD 05/02/15 2313  Sharman Cheek, MD 05/02/15 [id number]

## 2015-05-02 NOTE — Discharge Instructions (Signed)
You were prescribed a medication that is potentially sedating. Do not drink alcohol, drive or participate in any other potentially dangerous activities while taking this medication as it may make you sleepy. Do not take this medication with any other sedating medications, either prescription or over-the-counter. If you were prescribed Percocet or Vicodin, do not take these with acetaminophen (Tylenol) as it is already contained within these medications.   Opioid pain medications (or "narcotics") can be habit forming.  Use it as little as possible to achieve adequate pain control.  Do not use or use it with extreme caution if you have a history of opiate abuse or dependence.  If you are on a pain contract with your primary care doctor or a pain specialist, be sure to let them know you were prescribed this medication today from the [facility] Emergency Department.  This medication is intended for your use only - do not give any to anyone else and keep it in a secure place where nobody else, especially children and pets, have access to it.  It will also cause or worsen constipation, so you may want to consider taking an over-the-counter stool softener while you are taking this medication.  Abdominal Pain, Adult Many things can cause abdominal pain. Usually, abdominal pain is not caused by a disease and will improve without treatment. It can often be observed and treated at home. Your health care provider will do a physical exam and possibly order blood tests and X-rays to help determine the seriousness of your pain. However, in many cases, more time must pass before a clear cause of the pain can be found. Before that point, your health care provider may not know if you need more testing or further treatment. HOME CARE INSTRUCTIONS Monitor your abdominal pain for any changes. The following actions may help to alleviate any discomfort you are experiencing:  Only take over-the-counter or prescription  medicines as directed by your health care provider.  Do not take laxatives unless directed to do so by your health care provider.  Try a clear liquid diet (broth, tea, or water) as directed by your health care provider. Slowly move to a bland diet as tolerated. SEEK MEDICAL CARE IF:  You have unexplained abdominal pain.  You have abdominal pain associated with nausea or diarrhea.  You have pain when you urinate or have a bowel movement.  You experience abdominal pain that wakes you in the night.  You have abdominal pain that is worsened or improved by eating food.  You have abdominal pain that is worsened with eating fatty foods.  You have a fever. SEEK IMMEDIATE MEDICAL CARE IF:  Your pain does not go away within 2 hours.  You keep throwing up (vomiting).  Your pain is felt only in portions of the abdomen, such as the right side or the left lower portion of the abdomen.  You pass bloody or black tarry stools. MAKE SURE YOU:  Understand these instructions.  Will watch your condition.  Will get help right away if you are not doing well or get worse.   This information is not intended to replace advice given to you by your health care provider. Make sure you discuss any questions you have with your health care provider.   Document Released: 04/05/2005 Document Revised: 03/17/2015 Document Reviewed: 03/05/2013 Elsevier Interactive Patient Education 2016 [employer].  Syncope Syncope is a medical term for fainting or passing out. This means you lose consciousness and drop to the ground.  People are generally unconscious for less than 5 minutes. You may have some muscle twitches for up to 15 seconds before waking up and returning to normal. Syncope occurs more often in older adults, but it can happen to anyone. While most causes of syncope are not dangerous, syncope can be a sign of a serious medical problem. It is important to seek medical care.  CAUSES  Syncope is caused by  a sudden drop in blood flow to the brain. The specific cause is often not determined. Factors that can bring on syncope include:  Taking medicines that lower blood pressure.  Sudden changes in posture, such as standing up quickly.  Taking more medicine than prescribed.  Standing in one place for too long.  Seizure disorders.  Dehydration and excessive exposure to heat.  Low blood sugar (hypoglycemia).  Straining to have a bowel movement.  Heart disease, irregular heartbeat, or other circulatory problems.  Fear, emotional distress, seeing blood, or severe pain. SYMPTOMS  Right before fainting, you may:  Feel dizzy or light-headed.  Feel nauseous.  See all white or all black in your field of vision.  Have cold, clammy skin. DIAGNOSIS  Your health care provider will ask about your symptoms, perform a physical exam, and perform an electrocardiogram (ECG) to record the electrical activity of your heart. Your health care provider may also perform other heart or blood tests to determine the cause of your syncope which may include:  Transthoracic echocardiogram (TTE). During echocardiography, sound waves are used to evaluate how blood flows through your heart.  Transesophageal echocardiogram (TEE).  Cardiac monitoring. This allows your health care provider to monitor your heart rate and rhythm in real time.  Holter monitor. This is a portable device that records your heartbeat and can help diagnose heart arrhythmias. It allows your health care provider to track your heart activity for several days, if needed.  Stress tests by exercise or by giving medicine that makes the heart beat faster. TREATMENT  In most cases, no treatment is needed. Depending on the cause of your syncope, your health care provider may recommend changing or stopping some of your medicines. HOME CARE INSTRUCTIONS  Have someone stay with you until you feel stable.  Do not drive, use machinery, or play  sports until your health care provider says it is okay.  Keep all follow-up appointments as directed by your health care provider.  Lie down right away if you start feeling like you might faint. Breathe deeply and steadily. Wait until all the symptoms have passed.  Drink enough fluids to keep your urine clear or pale yellow.  If you are taking blood pressure or heart medicine, get up slowly and take several minutes to sit and then stand. This can reduce dizziness. SEEK IMMEDIATE MEDICAL CARE IF:   You have a severe headache.  You have unusual pain in the chest, abdomen, or back.  You are bleeding from your mouth or rectum, or you have black or tarry stool.  You have an irregular or very fast heartbeat.  You have pain with breathing.  You have repeated fainting or seizure-like jerking during an episode.  You faint when sitting or lying down.  You have confusion.  You have trouble walking.  You have severe weakness.  You have vision problems. If you fainted, call your local emergency services (911 in [place] Do not drive yourself to the hospital.    This information is not intended to replace advice given to you by your  health care provider. Make sure you discuss any questions you have with your health care provider.   Document Released: 06/26/2005 Document Revised: 11/10/2014 Document Reviewed: 08/25/2011 Elsevier Interactive Patient Education 2016 [employer].  Syncope, commonly known as fainting, is a temporary loss of consciousness. It occurs when the blood flow to the brain is reduced. Vasovagal syncope (also called neurocardiogenic syncope) is a fainting spell in which the blood flow to the brain is reduced because of a sudden drop in heart rate and blood pressure. Vasovagal syncope occurs when the brain and the cardiovascular system (blood vessels) do not adequately communicate and respond to each other. This is the most common cause of fainting. It often occurs in  response to fear or some other type of emotional or physical stress. The body has a reaction in which the heart starts beating too slowly or the blood vessels expand, reducing blood pressure. This type of fainting spell is generally considered harmless. However, injuries can occur if a person takes a sudden fall during a fainting spell.  CAUSES  Vasovagal syncope occurs when a person's blood pressure and heart rate decrease suddenly, usually in response to a trigger. Many things and situations can trigger an episode. Some of these include:   Pain.   Fear.   The sight of blood or medical procedures, such as blood being drawn from a vein.   Common activities, such as coughing, swallowing, stretching, or going to the bathroom.   Emotional stress.   Prolonged standing, especially in a warm environment.   Lack of sleep or rest.   Prolonged lack of food.   Prolonged lack of fluids.   Recent illness.  The use of certain drugs that affect blood pressure, such as cocaine, alcohol, marijuana, inhalants, and opiates.  SYMPTOMS  Before the fainting episode, you may:   Feel dizzy or light headed.   Become pale.  Sense that you are going to faint.   Feel like the room is spinning.   Have tunnel vision, only seeing directly in front of you.   Feel sick to your stomach (nauseous).   See spots or slowly lose vision.   Hear ringing in your ears.   Have a headache.   Feel warm and sweaty.   Feel a sensation of pins and needles. During the fainting spell, you will generally be unconscious for no longer than a couple minutes before waking up and returning to normal. If you get up too quickly before your body can recover, you may faint again. Some twitching or jerky movements may occur during the fainting spell.  DIAGNOSIS  Your health care provider will ask about your symptoms, take a medical history, and perform a physical exam. Various tests may be done to rule out  other causes of fainting. These may include blood tests and tests to check the heart, such as electrocardiography, echocardiography, and possibly an electrophysiology study. When other causes have been ruled out, a test may be done to check the body's response to changes in position (tilt table test). TREATMENT  Most cases of vasovagal syncope do not require treatment. Your health care provider may recommend ways to avoid fainting triggers and may provide home strategies for preventing fainting. If you must be exposed to a possible trigger, you can drink additional fluids to help reduce your chances of having an episode of vasovagal syncope. If you have warning signs of an oncoming episode, you can respond by positioning yourself favorably (lying down). If your fainting spells  continue, you may be given medicines to prevent fainting. Some medicines may help make you more resistant to repeated episodes of vasovagal syncope. Special exercises or compression stockings may be recommended. In rare cases, the surgical placement of a pacemaker is considered. HOME CARE INSTRUCTIONS   Learn to identify the warning signs of vasovagal syncope.   Sit or lie down at the first warning sign of a fainting spell. If sitting, put your head down between your legs. If you lie down, swing your legs up in the air to increase blood flow to the brain.   Avoid hot tubs and saunas.  Avoid prolonged standing.  Drink enough fluids to keep your urine clear or pale yellow. Avoid caffeine.  Increase salt in your diet as directed by your health care provider.   If you have to stand for a long time, perform movements such as:   Crossing your legs.   Flexing and stretching your leg muscles.   Squatting.   Moving your legs.   Bending over.   Only take over-the-counter or prescription medicines as directed by your health care provider. Do not suddenly stop any medicines without asking your health care provider  first. SEEK MEDICAL CARE IF:   Your fainting spells continue or happen more frequently in spite of treatment.   You lose consciousness for more than a couple minutes.  You have fainting spells during or after exercising or after being startled.   You have new symptoms that occur with the fainting spells, such as:   Shortness of breath.  Chest pain.   Irregular heartbeat.   You have episodes of twitching or jerky movements that last longer than a few seconds.  You have episodes of twitching or jerky movements without obvious fainting. SEEK IMMEDIATE MEDICAL CARE IF:   You have injuries or bleeding after a fainting spell.   You have episodes of twitching or jerky movements that last longer than 5 minutes.   You have more than one spell of twitching or jerky movements before returning to consciousness after fainting.   This information is not intended to replace advice given to you by your health care provider. Make sure you discuss any questions you have with your health care provider.   Document Released: 06/12/2012 Document Revised: 11/10/2014 Document Reviewed: 06/12/2012 Elsevier Interactive Patient Education [employer]2016 [employer] Inc[employer]

## 2015-10-05 ENCOUNTER — Ambulatory Visit (INDEPENDENT_AMBULATORY_CARE_PROVIDER_SITE_OTHER): Payer: Managed Care, Other (non HMO) | Admitting: Podiatry

## 2015-10-05 ENCOUNTER — Encounter: Payer: Self-pay | Admitting: Podiatry

## 2015-10-05 ENCOUNTER — Ambulatory Visit (INDEPENDENT_AMBULATORY_CARE_PROVIDER_SITE_OTHER): Payer: Managed Care, Other (non HMO)

## 2015-10-05 VITALS — BP 127/75 | HR 102 | Resp 18

## 2015-10-05 DIAGNOSIS — S93402A Sprain of unspecified ligament of left ankle, initial encounter: Secondary | ICD-10-CM

## 2015-10-05 DIAGNOSIS — R52 Pain, unspecified: Secondary | ICD-10-CM | POA: Diagnosis not present

## 2015-10-05 DIAGNOSIS — M7731 Calcaneal spur, right foot: Secondary | ICD-10-CM | POA: Diagnosis not present

## 2015-10-05 DIAGNOSIS — M7730 Calcaneal spur, unspecified foot: Secondary | ICD-10-CM | POA: Insufficient documentation

## 2015-10-05 DIAGNOSIS — M79671 Pain in right foot: Secondary | ICD-10-CM

## 2015-10-05 DIAGNOSIS — B351 Tinea unguium: Secondary | ICD-10-CM

## 2015-10-05 DIAGNOSIS — S93409A Sprain of unspecified ligament of unspecified ankle, initial encounter: Secondary | ICD-10-CM | POA: Insufficient documentation

## 2015-10-05 DIAGNOSIS — M7661 Achilles tendinitis, right leg: Secondary | ICD-10-CM

## 2015-10-05 NOTE — Patient Instructions (Addendum)
Over the counter treatment for toenail fungus is called fungi-nail  -------  Acute Ankle Sprain With Phase I Rehab An acute ankle sprain is a partial or complete tear in one or more of the ligaments of the ankle due to traumatic injury. The severity of the injury depends on both the number of ligaments sprained and the grade of sprain. There are 3 grades of sprains.   A grade 1 sprain is a mild sprain. There is a slight pull without obvious tearing. There is no loss of strength, and the muscle and ligament are the correct length.  A grade 2 sprain is a moderate sprain. There is tearing of fibers within the substance of the ligament where it connects two bones or two cartilages. The length of the ligament is increased, and there is usually decreased strength.  A grade 3 sprain is a complete rupture of the ligament and is uncommon. In addition to the grade of sprain, there are three types of ankle sprains.  Lateral ankle sprains: This is a sprain of one or more of the three ligaments on the outer side (lateral) of the ankle. These are the most common sprains. Medial ankle sprains: There is one large triangular ligament of the inner side (medial) of the ankle that is susceptible to injury. Medial ankle sprains are less common. Syndesmosis, "high ankle," sprains: The syndesmosis is the ligament that connects the two bones of the lower leg. Syndesmosis sprains usually only occur with very severe ankle sprains. SYMPTOMS  Pain, tenderness, and swelling in the ankle, starting at the side of injury that may progress to the whole ankle and foot with time.  "Pop" or tearing sensation at the time of injury.  Bruising that may spread to the heel.  Impaired ability to walk soon after injury. CAUSES   Acute ankle sprains are caused by trauma placed on the ankle that temporarily forces or pries the anklebone (talus) out of its normal socket.  Stretching or tearing of the ligaments that normally hold the  joint in place (usually due to a twisting injury). RISK INCREASES WITH:  Previous ankle sprain.  Sports in which the foot may land awkwardly (i.e., basketball, volleyball, or soccer) or walking or running on uneven or rough surfaces.  Shoes with inadequate support to prevent sideways motion when stress occurs.  Poor strength and flexibility.  Poor balance skills.  Contact sports. PREVENTION   Warm up and stretch properly before activity.  Maintain physical fitness:  Ankle and leg flexibility, muscle strength, and endurance.  Cardiovascular fitness.  Balance training activities.  Use proper technique and have a coach correct improper technique.  Taping, protective strapping, bracing, or high-top tennis shoes may help prevent injury. Initially, tape is best; however, it loses most of its support function within 10 to 15 minutes.  Wear proper-fitted protective shoes (High-top shoes with taping or bracing is more effective than either alone).  Provide the ankle with support during sports and practice activities for 12 months following injury. PROGNOSIS   If treated properly, ankle sprains can be expected to recover completely; however, the length of recovery depends on the degree of injury.  A grade 1 sprain usually heals enough in 5 to 7 days to allow modified activity and requires an average of 6 weeks to heal completely.  A grade 2 sprain requires 6 to 10 weeks to heal completely.  A grade 3 sprain requires 12 to 16 weeks to heal.  A syndesmosis sprain often takes more than  3 months to heal. RELATED COMPLICATIONS   Frequent recurrence of symptoms may result in a chronic problem. Appropriately addressing the problem the first time decreases the frequency of recurrence and optimizes healing time. Severity of the initial sprain does not predict the likelihood of later instability.  Injury to other structures (bone, cartilage, or tendon).  A chronically unstable or  arthritic ankle joint is a possibility with repeated sprains. TREATMENT Treatment initially involves the use of ice, medication, and compression bandages to help reduce pain and inflammation. Ankle sprains are usually immobilized in a walking cast or boot to allow for healing. Crutches may be recommended to reduce pressure on the injury. After immobilization, strengthening and stretching exercises may be necessary to regain strength and a full range of motion. Surgery is rarely needed to treat ankle sprains. MEDICATION   Nonsteroidal anti-inflammatory medications, such as aspirin and ibuprofen (do not take for the first 3 days after injury or within 7 days before surgery), or other minor pain relievers, such as acetaminophen, are often recommended. Take these as directed by your caregiver. Contact your caregiver immediately if any bleeding, stomach upset, or signs of an allergic reaction occur from these medications.  Ointments applied to the skin may be helpful.  Pain relievers may be prescribed as necessary by your caregiver. Do not take prescription pain medication for longer than 4 to 7 days. Use only as directed and only as much as you need. HEAT AND COLD  Cold treatment (icing) is used to relieve pain and reduce inflammation for acute and chronic cases. Cold should be applied for 10 to 15 minutes every 2 to 3 hours for inflammation and pain and immediately after any activity that aggravates your symptoms. Use ice packs or an ice massage.  Heat treatment may be used before performing stretching and strengthening activities prescribed by your caregiver. Use a heat pack or a warm soak. SEEK IMMEDIATE MEDICAL CARE IF:   Pain, swelling, or bruising worsens despite treatment.  You experience pain, numbness, discoloration, or coldness in the foot or toes.  New, unexplained symptoms develop (drugs used in treatment may produce side effects.) EXERCISES  PHASE I EXERCISES RANGE OF MOTION (ROM)  AND STRETCHING EXERCISES - Ankle Sprain, Acute Phase I, Weeks 1 to 2 These exercises may help you when beginning to restore flexibility in your ankle. You will likely work on these exercises for the 1 to 2 weeks after your injury. Once your physician, physical therapist, or athletic trainer sees adequate progress, he or she will advance your exercises. While completing these exercises, remember:   Restoring tissue flexibility helps normal motion to return to the joints. This allows healthier, less painful movement and activity.  An effective stretch should be held for at least 30 seconds.  A stretch should never be painful. You should only feel a gentle lengthening or release in the stretched tissue. RANGE OF MOTION - Dorsi/Plantar Flexion  While sitting with your right / left knee straight, draw the top of your foot upwards by flexing your ankle. Then reverse the motion, pointing your toes downward.  Hold each position for __________ seconds.  After completing your first set of exercises, repeat this exercise with your knee bent. Repeat __________ times. Complete this exercise __________ times per day.  RANGE OF MOTION - Ankle Alphabet  Imagine your right / left big toe is a pen.  Keeping your hip and knee still, write out the entire alphabet with your "pen." Make the letters as large as  you can without increasing any discomfort. Repeat __________ times. Complete this exercise __________ times per day.  STRENGTHENING EXERCISES - Ankle Sprain, Acute -Phase I, Weeks 1 to 2 These exercises may help you when beginning to restore strength in your ankle. You will likely work on these exercises for 1 to 2 weeks after your injury. Once your physician, physical therapist, or athletic trainer sees adequate progress, he or she will advance your exercises. While completing these exercises, remember:   Muscles can gain both the endurance and the strength needed for everyday activities through controlled  exercises.  Complete these exercises as instructed by your physician, physical therapist, or athletic trainer. Progress the resistance and repetitions only as guided.  You may experience muscle soreness or fatigue, but the pain or discomfort you are trying to eliminate should never worsen during these exercises. If this pain does worsen, stop and make certain you are following the directions exactly. If the pain is still present after adjustments, discontinue the exercise until you can discuss the trouble with your clinician. STRENGTH - Dorsiflexors  Secure a rubber exercise band/tubing to a fixed object (i.e., table, pole) and loop the other end around your right / left foot.  Sit on the floor facing the fixed object. The band/tubing should be slightly tense when your foot is relaxed.  Slowly draw your foot back toward you using your ankle and toes.  Hold this position for __________ seconds. Slowly release the tension in the band and return your foot to the starting position. Repeat __________ times. Complete this exercise __________ times per day.  STRENGTH - Plantar-flexors   Sit with your right / left leg extended. Holding onto both ends of a rubber exercise band/tubing, loop it around the ball of your foot. Keep a slight tension in the band.  Slowly push your toes away from you, pointing them downward.  Hold this position for __________ seconds. Return slowly, controlling the tension in the band/tubing. Repeat __________ times. Complete this exercise __________ times per day.  STRENGTH - Ankle Eversion  Secure one end of a rubber exercise band/tubing to a fixed object (table, pole). Loop the other end around your foot just before your toes.  Place your fists between your knees. This will focus your strengthening at your ankle.  Drawing the band/tubing across your opposite foot, slowly, pull your little toe out and up. Make sure the band/tubing is positioned to resist the entire  motion.  Hold this position for __________ seconds. Have your muscles resist the band/tubing as it slowly pulls your foot back to the starting position.  Repeat __________ times. Complete this exercise __________ times per day.  STRENGTH - Ankle Inversion  Secure one end of a rubber exercise band/tubing to a fixed object (table, pole). Loop the other end around your foot just before your toes.  Place your fists between your knees. This will focus your strengthening at your ankle.  Slowly, pull your big toe up and in, making sure the band/tubing is positioned to resist the entire motion.  Hold this position for __________ seconds.  Have your muscles resist the band/tubing as it slowly pulls your foot back to the starting position. Repeat __________ times. Complete this exercises __________ times per day.  STRENGTH - Towel Curls  Sit in a chair positioned on a non-carpeted surface.  Place your right / left foot on a towel, keeping your heel on the floor.  Pull the towel toward your heel by only curling your toes. Keep  your heel on the floor.  If instructed by your physician, physical therapist, or athletic trainer, add weight to the end of the towel. Repeat __________ times. Complete this exercise __________ times per day.   This information is not intended to replace advice given to you by your health care provider. Make sure you discuss any questions you have with your health care provider.   Document Released: 01/25/2005 Document Revised: 07/17/2014 Document Reviewed: 10/08/2008 [employer] Interactive Patient Education 2016 [employer]   Achilles Tendinitis With Rehab Achilles tendinitis is a disorder of the Achilles tendon. The Achilles tendon connects the large calf muscles (Gastrocnemius and Soleus) to the heel bone (calcaneus). This tendon is sometimes called the heel cord. It is important for pushing-off and standing on your toes and is important for walking, running, or  jumping. Tendinitis is often caused by overuse and repetitive microtrauma. SYMPTOMS  Pain, tenderness, swelling, warmth, and redness may occur over the Achilles tendon even at rest.  Pain with pushing off, or flexing or extending the ankle.  Pain that is worsened after or during activity. CAUSES   Overuse sometimes seen with rapid increase in exercise programs or in sports requiring running and jumping.  Poor physical conditioning (strength and flexibility or endurance).  Running sports, especially training running down hills.  Inadequate warm-up before practice or play or failure to stretch before participation.  Injury to the tendon. PREVENTION   Warm up and stretch before practice or competition.  Allow time for adequate rest and recovery between practices and competition.  Keep up conditioning.  Keep up ankle and leg flexibility.  Improve or keep muscle strength and endurance.  Improve cardiovascular fitness.  Use proper technique.  Use proper equipment (shoes, skates).  To help prevent recurrence, taping, protective strapping, or an adhesive bandage may be recommended for several weeks after healing is complete. PROGNOSIS   Recovery may take weeks to several months to heal.  Longer recovery is expected if symptoms have been prolonged.  Recovery is usually quicker if the inflammation is due to a direct blow as compared with overuse or sudden strain. RELATED COMPLICATIONS   Healing time will be prolonged if the condition is not correctly treated. The injury must be given plenty of time to heal.  Symptoms can reoccur if activity is resumed too soon.  Untreated, tendinitis may increase the risk of tendon rupture requiring additional time for recovery and possibly surgery. TREATMENT   The first treatment consists of rest anti-inflammatory medication, and ice to relieve the pain.  Stretching and strengthening exercises after resolution of pain will likely help  reduce the risk of recurrence. Referral to a physical therapist or athletic trainer for further evaluation and treatment may be helpful.  A walking boot or cast may be recommended to rest the Achilles tendon. This can help break the cycle of inflammation and microtrauma.  Arch supports (orthotics) may be prescribed or recommended by your caregiver as an adjunct to therapy and rest.  Surgery to remove the inflamed tendon lining or degenerated tendon tissue is rarely necessary and has shown less than predictable results. MEDICATION   Nonsteroidal anti-inflammatory medications, such as aspirin and ibuprofen, may be used for pain and inflammation relief. Do not take within 7 days before surgery. Take these as directed by your caregiver. Contact your caregiver immediately if any bleeding, stomach upset, or signs of allergic reaction occur. Other minor pain relievers, such as acetaminophen, may also be used.  Pain relievers may be prescribed as necessary  by your caregiver. Do not take prescription pain medication for longer than 4 to 7 days. Use only as directed and only as much as you need.  Cortisone injections are rarely indicated. Cortisone injections may weaken tendons and predispose to rupture. It is better to give the condition more time to heal than to use them. HEAT AND COLD  Cold is used to relieve pain and reduce inflammation for acute and chronic Achilles tendinitis. Cold should be applied for 10 to 15 minutes every 2 to 3 hours for inflammation and pain and immediately after any activity that aggravates your symptoms. Use ice packs or an ice massage.  Heat may be used before performing stretching and strengthening activities prescribed by your caregiver. Use a heat pack or a warm soak. SEEK MEDICAL CARE IF:  Symptoms get worse or do not improve in 2 weeks despite treatment.  New, unexplained symptoms develop. Drugs used in treatment may produce side effects. EXERCISES RANGE OF MOTION  (ROM) AND STRETCHING EXERCISES - Achilles Tendinitis  These exercises may help you when beginning to rehabilitate your injury. Your symptoms may resolve with or without further involvement from your physician, physical therapist or athletic trainer. While completing these exercises, remember:   Restoring tissue flexibility helps normal motion to return to the joints. This allows healthier, less painful movement and activity.  An effective stretch should be held for at least 30 seconds.  A stretch should never be painful. You should only feel a gentle lengthening or release in the stretched tissue. STRETCH - Gastroc, Standing   Place hands on wall.  Extend right / left leg, keeping the front knee somewhat bent.  Slightly point your toes inward on your back foot.  Keeping your right / left heel on the floor and your knee straight, shift your weight toward the wall, not allowing your back to arch.  You should feel a gentle stretch in the right / left calf. Hold this position for __________ seconds. Repeat __________ times. Complete this stretch __________ times per day. STRETCH - Soleus, Standing   Place hands on wall.  Extend right / left leg, keeping the other knee somewhat bent.  Slightly point your toes inward on your back foot.  Keep your right / left heel on the floor, bend your back knee, and slightly shift your weight over the back leg so that you feel a gentle stretch deep in your back calf.  Hold this position for __________ seconds. Repeat __________ times. Complete this stretch __________ times per day. STRETCH - Gastrocsoleus, Standing  Note: This exercise can place a lot of stress on your foot and ankle. Please complete this exercise only if specifically instructed by your caregiver.   Place the ball of your right / left foot on a step, keeping your other foot firmly on the same step.  Hold on to the wall or a rail for balance.  Slowly lift your other foot, allowing  your body weight to press your heel down over the edge of the step.  You should feel a stretch in your right / left calf.  Hold this position for __________ seconds.  Repeat this exercise with a slight bend in your knee. Repeat __________ times. Complete this stretch __________ times per day.  STRENGTHENING EXERCISES - Achilles Tendinitis These exercises may help you when beginning to rehabilitate your injury. They may resolve your symptoms with or without further involvement from your physician, physical therapist or athletic trainer. While completing these exercises, remember:  Muscles can gain both the endurance and the strength needed for everyday activities through controlled exercises.  Complete these exercises as instructed by your physician, physical therapist or athletic trainer. Progress the resistance and repetitions only as guided.  You may experience muscle soreness or fatigue, but the pain or discomfort you are trying to eliminate should never worsen during these exercises. If this pain does worsen, stop and make certain you are following the directions exactly. If the pain is still present after adjustments, discontinue the exercise until you can discuss the trouble with your clinician. STRENGTH - Plantar-flexors   Sit with your right / left leg extended. Holding onto both ends of a rubber exercise band/tubing, loop it around the ball of your foot. Keep a slight tension in the band.  Slowly push your toes away from you, pointing them downward.  Hold this position for __________ seconds. Return slowly, controlling the tension in the band/tubing. Repeat __________ times. Complete this exercise __________ times per day.  STRENGTH - Plantar-flexors   Stand with your feet shoulder width apart. Steady yourself with a wall or table using as little support as needed.  Keeping your weight evenly spread over the width of your feet, rise up on your toes.*  Hold this position for  __________ seconds. Repeat __________ times. Complete this exercise __________ times per day.  *If this is too easy, shift your weight toward your right / left leg until you feel challenged. Ultimately, you may be asked to do this exercise with your right / left foot only. STRENGTH - Plantar-flexors, Eccentric  Note: This exercise can place a lot of stress on your foot and ankle. Please complete this exercise only if specifically instructed by your caregiver.   Place the balls of your feet on a step. With your hands, use only enough support from a wall or rail to keep your balance.  Keep your knees straight and rise up on your toes.  Slowly shift your weight entirely to your right / left toes and pick up your opposite foot. Gently and with controlled movement, lower your weight through your right / left foot so that your heel drops below the level of the step. You will feel a slight stretch in the back of your calf at the end position.  Use the healthy leg to help rise up onto the balls of both feet, then lower weight only on the right / left leg again. Build up to 15 repetitions. Then progress to 3 consecutive sets of 15 repetitions.*  After completing the above exercise, complete the same exercise with a slight knee bend (about 30 degrees). Again, build up to 15 repetitions. Then progress to 3 consecutive sets of 15 repetitions.* Perform this exercise __________ times per day.  *When you easily complete 3 sets of 15, your physician, physical therapist or athletic trainer may advise you to add resistance by wearing a backpack filled with additional weight. STRENGTH - Plantar Flexors, Seated   Sit on a chair that allows your feet to rest flat on the ground. If necessary, sit at the edge of the chair.  Keeping your toes firmly on the ground, lift your right / left heel as far as you can without increasing any discomfort in your ankle. Repeat __________ times. Complete this exercise __________  times a day. *If instructed by your physician, physical therapist or athletic trainer, you may add ____________________ of resistance by placing a weighted object on your right / left knee.   This  information is not intended to replace advice given to you by your health care provider. Make sure you discuss any questions you have with your health care provider.   Document Released: 01/25/2005 Document Revised: 07/17/2014 Document Reviewed: 10/08/2008 [employer] Interactive Patient Education [employer]

## 2015-10-05 NOTE — Progress Notes (Signed)
Patient ID: Kaitlin LamDonna Ammon, female   DOB: 06/08/1951, 65 y.o.   MRN: [id number]  Subjective: 65 year old female presents the office today for concerns of thickness and discoloration to both of her big toenails which is very thick causing pressure in shoe gear. Also that 1 week ago she felt as her left ankle and she describes an inversion type injury last week the pain has decreased as well as the swelling. Majority of his the outside part of the ankle. She's also had pain in her right ankle and she points to Achilles tendon for quite some time. [place] and she started to have a flareup of symptoms. Denies any recent injury or trauma. No swelling or redness. No tingling or numbness. No recent treatment. No other complaints at this time. Denies any systemic complaints such as fevers, chills, nausea, vomiting. No acute changes since last appointment, and no other complaints at this time.   She cannot take anti-inflammatories due to recent weight loss surgery.  Objective: AAO x3, NAD DP/PT pulses palpable bilaterally, CRT less than 3 seconds Protective sensation intact with Dorann OuSimms Weinstein monofilament Bilateral hallux nails are hypertrophic, dystrophic, brittle, discolored, elongated 2. There is no swelling erythema or drainage. Tenderness of bilateral hallux nails. There is tenderness along the course the ATFL on the left side. No pain on the other ankle limits. Anterior drawer test is be stable as well as tailor tilt test. There is faint edema along this area that any erythema or increase in warmth. No tenderness on the fibular other areas of ankle. Ankle joint range of motion intact. There is tenderness on the posterior aspect of the right calcaneus on the small retrocalcaneal exostosis. This is along the insertion of the Achilles tendon. There is no defect of the tendon and Thompson test is negative. No pain with lateral compression of the calcaneus. No areas of  pinpoint bony tenderness or pain with vibratory sensation. MMT 5/5, ROM WNL. No edema, erythema, increase in warmth to bilateral lower extremities.  No open lesions or pre-ulcerative lesions.  No pain with calf compression, swelling, warmth, erythema  Assessment: Left ankle sprain, right Achilles tendinitis/heel spur, symptomatic onychomycosis  Plan: -All treatment options discussed with the patient including all alternatives, risks, complications.  -X-rays were obtained and reviewed with the patient. Heel spur present on the right side. No evidence of acute fracture bilaterally. -Ankle brace the left side which showed he had at home. Start range of motion, rehabilitation exercises. Ice to the area. Compound cream for anti-inflammatory. -Night splint for the right side as well as stretching exercises as well as compound cream. Ice to the area. -Debrided bilateral hallux nails 2 without complications or bleeding -Follow-up 4 weeks. -Patient encouraged to call the office with any questions, concerns, change in symptoms.

## 2015-10-21 ENCOUNTER — Telehealth: Payer: Self-pay | Admitting: *Deleted

## 2015-10-21 NOTE — Telephone Encounter (Signed)
Pt does not want [facility] compound.

## 2015-11-02 ENCOUNTER — Ambulatory Visit: Payer: Managed Care, Other (non HMO) | Admitting: Podiatry

## 2015-11-18 ENCOUNTER — Encounter: Payer: Self-pay | Admitting: Podiatry

## 2015-11-18 ENCOUNTER — Ambulatory Visit (INDEPENDENT_AMBULATORY_CARE_PROVIDER_SITE_OTHER): Payer: Managed Care, Other (non HMO) | Admitting: Podiatry

## 2015-11-18 VITALS — BP 140/85 | HR 85 | Resp 18

## 2015-11-18 DIAGNOSIS — S93402A Sprain of unspecified ligament of left ankle, initial encounter: Secondary | ICD-10-CM

## 2015-11-18 DIAGNOSIS — M7661 Achilles tendinitis, right leg: Secondary | ICD-10-CM

## 2015-11-18 DIAGNOSIS — M7672 Peroneal tendinitis, left leg: Secondary | ICD-10-CM

## 2015-11-18 NOTE — Patient Instructions (Signed)
Peroneal Tendinitis With Rehab Tendonitis is inflammation of a tendon. Inflammation of the tendons on the back of the outer ankle (peroneal tendons) is known as peroneal tendonitis. The peroneal tendons are responsible for connecting the muscles that allow you to stand on your tiptoes to the bones of the ankle. For this reason, peroneal tendonitis often causes pain when trying to complete such motions. Peroneal tendonitis often involves a tear (strain) of the peroneal tendons. Strains are classified into three categories. Grade 1 strains cause pain, but the tendon is not lengthened. Grade 2 strains include a lengthened ligament, due to the ligament being stretched or partially ruptured. With grade 2 strains there is still function, although function may be decreased. Grade 3 strains involve a complete tear of the tendon or muscle, and function is usually impaired. SYMPTOMS   Pain, tenderness, swelling, warmth, or redness over the back of the outer side of the ankle, the outer part of the mid-foot, or the bottom of the arch.  Pain that gets worse with ankle motion (especially when pushing off or pushing down with the front of the foot), or when standing on the ball of the foot or pushing the foot outward.  Crackling sound (crepitation) when the tendon is moved or touched. CAUSES  Peroneal tendinitis occurs when injury to the peroneal tendons causes the body to respond with inflammation. Common causes of injury include:  An overuse injury, in which the groove behind the outer ankle (where the tendon is located) causes wear on the tendon.  A sudden stress placed on the tendon, such as from an increase in the intensity, frequency, or duration of training.  Direct hit (trauma) to the tendon.  Return to activity too soon after a previous ankle injury. RISK INCREASES WITH:  Sports that require sudden, repetitive pushing off of the foot, such as jumping or quick starts.  Kicking and running sports,  especially running down hills or long distances.  Poor strength and flexibility.  Previous injury to the foot, ankle, or leg. PREVENTION  Warm up and stretch properly before activity.  Allow for adequate recovery between workouts.  Maintain physical fitness:  Strength, flexibility, and endurance.  Cardiovascular fitness.  Complete rehabilitation after previous injury. PROGNOSIS  If treated properly, peroneal tendonitis usually heals within 6 weeks.  RELATED COMPLICATIONS  Longer healing time, if not properly treated or if not given enough time to heal.  Recurring symptoms if activity is resumed too soon, with overuse, or when using poor technique.  If untreated, tendinitis may result in tendon rupture, requiring surgery. TREATMENT  Treatment first involves the use of ice and medicine to reduce pain and inflammation. The use of strengthening and stretching exercises may help reduce pain with activity. These exercises may be performed at unsuccessful, surgery to remove the inflamed tendon lining (sheath) may be advised.  MEDICATION   If pain medicine is needed, nonsteroidal anti-inflammatory medicines (aspirin and ibuprofen), or other minor pain relievers (acetaminophen), are often advised.  Do not take pain medicine for 7 days before surgery.  Prescription pain relievers may be given, if your caregiver thinks they are needed. Use only as directed and only as much as you need. HEAT AND COLD  Cold treatment (icing) should be applied for 10 to 15 minutes every 2 to 3 hours for inflammation and pain, and immediately after activity that aggravates your symptoms. Use ice packs or an ice massage.  Heat treatment may be used before performing stretching and strengthening activities prescribed by your   caregiver, physical therapist, or athletic trainer. Use a heat pack or a warm water soak. SEEK MEDICAL CARE IF:  Symptoms get worse or do not improve in 2 to 4 weeks, despite  treatment.  New, unexplained symptoms develop. (Drugs used in treatment may produce side effects.) EXERCISES RANGE OF MOTION (ROM) AND STRETCHING EXERCISES - Peroneal Tendinitis These exercises may help you when beginning to rehabilitate your injury. Your symptoms may resolve with or without further involvement from your physician, physical therapist or athletic trainer. While completing these exercises, remember:   Restoring tissue flexibility helps normal motion to return to the joints. This allows healthier, less painful movement and activity.  An effective stretch should be held for at least 30 seconds.  A stretch should never be painful. You should only feel a gentle lengthening or release in the stretched tissue. RANGE OF MOTION - Ankle Eversion  Sit with your right / left ankle crossed over your opposite knee.  Grip your foot with your opposite hand, placing your thumb on the top of your foot and your fingers across the bottom of your foot.  Gently push your foot downward with a slight rotation, so your littlest toes rise slightly toward the ceiling.  You should feel a gentle stretch on the inside of your ankle. Hold the stretch for __________ seconds. Repeat __________ times. Complete this exercise __________ times per day.  RANGE OF MOTION - Ankle Inversion  Sit with your right / left ankle crossed over your opposite knee.  Grip your foot with your opposite hand, placing your thumb on the bottom of your foot and your fingers across the top of your foot.  Gently pull your foot so the smallest toe comes toward you and your thumb pushes the inside of the ball of your foot away from you.  You should feel a gentle stretch on the outside of your ankle. Hold the stretch for __________ seconds. Repeat __________ times. Complete this exercise __________ times per day.  RANGE OF MOTION - Ankle Plantar Flexion  Sit with your right / left leg crossed over your opposite knee.  Use  your opposite hand to pull the top of your foot and toes toward you.  You should feel a gentle stretch on the top of your foot and ankle. Hold this position for __________ seconds. Repeat __________ times. Complete __________ times per day.  STRETCH - Gastroc, Standing  Place your hands on a wall.  Extend your right / left leg behind you, keeping the front knee somewhat bent.  Slightly point your toes inward on your back foot.  Keeping your right / left heel on the floor and your knee straight, shift your weight toward the wall, not allowing your back to arch.  You should feel a gentle stretch in the calf. Hold this position for __________ seconds. Repeat __________ times. Complete this stretch __________ times per day. STRETCH - Soleus, Standing  Place your hands on a wall.  Extend your right / left leg behind you, keeping the other knee somewhat bent.  Slightly point your toes inward on your back foot.  Keep your heel on the floor, bend your back knee, and slightly shift your weight over the back leg so that you feel a gentle stretch deep in your back calf.  Hold this position for __________ seconds. Repeat __________ times. Complete this stretch __________ times per day. STRETCH - Gastrocsoleus, Standing Note: This exercise can place a lot of stress on your foot and ankle. Please   complete this exercise only if specifically instructed by your caregiver.   Place the ball of your right / left foot on a step, keeping your other foot firmly on the same step.  Hold on to the wall or a rail for balance.  Slowly lift your other foot, allowing your body weight to press your heel down over the edge of the step.  You should feel a stretch in your right / left calf.  Hold this position for __________ seconds.  Repeat this exercise with a slight bend in your knee. Repeat __________ times. Complete this stretch __________ times per day.  STRENGTHENING EXERCISES - Peroneal Tendinitis   These exercises may help you when beginning to rehabilitate your injury. They may resolve your symptoms with or without further involvement from your physician, physical therapist or athletic trainer. While completing these exercises, remember:   Muscles can gain both the endurance and the strength needed for everyday activities through controlled exercises.  Complete these exercises as instructed by your physician, physical therapist or athletic trainer. Increase the resistance and repetitions only as guided by your caregiver. STRENGTH - Dorsiflexors  Secure a rubber exercise band or tubing to a fixed object (table, pole) and loop the other end around your right / left foot.  Sit on the floor facing the fixed object. The band should be slightly tense when your foot is relaxed.  Slowly draw your foot back toward you, using your ankle and toes.  Hold this position for __________ seconds. Slowly release the tension in the band and return your foot to the starting position. Repeat __________ times. Complete this exercise __________ times per day.  STRENGTH - Towel Curls  Sit in a chair, on a non-carpeted surface.  Place your foot on a towel, keeping your heel on the floor.  Pull the towel toward your heel only by curling your toes. Keep your heel on the floor.  If instructed by your physician, physical therapist or athletic trainer, add weight to the end of the towel. Repeat __________ times. Complete this exercise __________ times per day. STRENGTH - Ankle Eversion   Secure one end of a rubber exercise band or tubing to a fixed object (table, pole). Loop the other end around your foot, just before your toes.  Place your fists between your knees. This will focus your strengthening at your ankle.  Drawing the band across your opposite foot, away from the pole, slowly, pull your little toe out and up. Make sure the band is positioned to resist the entire motion.  Hold this position for  __________ seconds.  Have your muscles resist the band, as it slowly pulls your foot back to the starting position. Repeat __________ times. Complete this exercise __________ times per day.    This information is not intended to replace advice given to you by your health care provider. Make sure you discuss any questions you have with your health care provider.   Document Released: 06/26/2005 Document Revised: 11/10/2014 Document Reviewed: 10/08/2008 [employer] Interactive Patient Education 2016 [employer]  Acute Ankle Sprain With Phase I Rehab An acute ankle sprain is a partial or complete tear in one or more of the ligaments of the ankle due to traumatic injury. The severity of the injury depends on both the number of ligaments sprained and the grade of sprain. There are 3 grades of sprains.   A grade 1 sprain is a mild sprain. There is a slight pull without obvious tearing. There is no loss  of strength, and the muscle and ligament are the correct length.  A grade 2 sprain is a moderate sprain. There is tearing of fibers within the substance of the ligament where it connects two bones or two cartilages. The length of the ligament is increased, and there is usually decreased strength.  A grade 3 sprain is a complete rupture of the ligament and is uncommon. In addition to the grade of sprain, there are three types of ankle sprains.  Lateral ankle sprains: This is a sprain of one or more of the three ligaments on the outer side (lateral) of the ankle. These are the most common sprains. Medial ankle sprains: There is one large triangular ligament of the inner side (medial) of the ankle that is susceptible to injury. Medial ankle sprains are less common. Syndesmosis, "high ankle," sprains: The syndesmosis is the ligament that connects the two bones of the lower leg. Syndesmosis sprains usually only occur with very severe ankle sprains. SYMPTOMS  Pain, tenderness, and swelling in the ankle,  starting at the side of injury that may progress to the whole ankle and foot with time.  "Pop" or tearing sensation at the time of injury.  Bruising that may spread to the heel.  Impaired ability to walk soon after injury. CAUSES   Acute ankle sprains are caused by trauma placed on the ankle that temporarily forces or pries the anklebone (talus) out of its normal socket.  Stretching or tearing of the ligaments that normally hold the joint in place (usually due to a twisting injury). RISK INCREASES WITH:  Previous ankle sprain.  Sports in which the foot may land awkwardly (i.e., basketball, volleyball, or soccer) or walking or running on uneven or rough surfaces.  Shoes with inadequate support to prevent sideways motion when stress occurs.  Poor strength and flexibility.  Poor balance skills.  Contact sports. PREVENTION   Warm up and stretch properly before activity.  Maintain physical fitness:  Ankle and leg flexibility, muscle strength, and endurance.  Cardiovascular fitness.  Balance training activities.  Use proper technique and have a coach correct improper technique.  Taping, protective strapping, bracing, or high-top tennis shoes may help prevent injury. Initially, tape is best; however, it loses most of its support function within 10 to 15 minutes.  Wear proper-fitted protective shoes (High-top shoes with taping or bracing is more effective than either alone).  Provide the ankle with support during sports and practice activities for 12 months following injury. PROGNOSIS   If treated properly, ankle sprains can be expected to recover completely; however, the length of recovery depends on the degree of injury.  A grade 1 sprain usually heals enough in 5 to 7 days to allow modified activity and requires an average of 6 weeks to heal completely.  A grade 2 sprain requires 6 to 10 weeks to heal completely.  A grade 3 sprain requires 12 to 16 weeks to heal.  A  syndesmosis sprain often takes more than 3 months to heal. RELATED COMPLICATIONS   Frequent recurrence of symptoms may result in a chronic problem. Appropriately addressing the problem the first time decreases the frequency of recurrence and optimizes healing time. Severity of the initial sprain does not predict the likelihood of later instability.  Injury to other structures (bone, cartilage, or tendon).  A chronically unstable or arthritic ankle joint is a possibility with repeated sprains. TREATMENT Treatment initially involves the use of ice, medication, and compression bandages to help reduce pain and inflammation. Ankle  sprains are usually immobilized in a walking cast or boot to allow for healing. Crutches may be recommended to reduce pressure on the injury. After immobilization, strengthening and stretching exercises may be necessary to regain strength and a full range of motion. Surgery is rarely needed to treat ankle sprains. MEDICATION   Nonsteroidal anti-inflammatory medications, such as aspirin and ibuprofen (do not take for the first 3 days after injury or within 7 days before surgery), or other minor pain relievers, such as acetaminophen, are often recommended. Take these as directed by your caregiver. Contact your caregiver immediately if any bleeding, stomach upset, or signs of an allergic reaction occur from these medications.  Ointments applied to the skin may be helpful.  Pain relievers may be prescribed as necessary by your caregiver. Do not take prescription pain medication for longer than 4 to 7 days. Use only as directed and only as much as you need. HEAT AND COLD  Cold treatment (icing) is used to relieve pain and reduce inflammation for acute and chronic cases. Cold should be applied for 10 to 15 minutes every 2 to 3 hours for inflammation and pain and immediately after any activity that aggravates your symptoms. Use ice packs or an ice massage.  Heat treatment may be  used before performing stretching and strengthening activities prescribed by your caregiver. Use a heat pack or a warm soak. SEEK IMMEDIATE MEDICAL CARE IF:   Pain, swelling, or bruising worsens despite treatment.  You experience pain, numbness, discoloration, or coldness in the foot or toes.  New, unexplained symptoms develop (drugs used in treatment may produce side effects.) EXERCISES  PHASE I EXERCISES RANGE OF MOTION (ROM) AND STRETCHING EXERCISES - Ankle Sprain, Acute Phase I, Weeks 1 to 2 These exercises may help you when beginning to restore flexibility in your ankle. You will likely work on these exercises for the 1 to 2 weeks after your injury. Once your physician, physical therapist, or athletic trainer sees adequate progress, he or she will advance your exercises. While completing these exercises, remember:   Restoring tissue flexibility helps normal motion to return to the joints. This allows healthier, less painful movement and activity.  An effective stretch should be held for at least 30 seconds.  A stretch should never be painful. You should only feel a gentle lengthening or release in the stretched tissue. RANGE OF MOTION - Dorsi/Plantar Flexion  While sitting with your right / left knee straight, draw the top of your foot upwards by flexing your ankle. Then reverse the motion, pointing your toes downward.  Hold each position for __________ seconds.  After completing your first set of exercises, repeat this exercise with your knee bent. Repeat __________ times. Complete this exercise __________ times per day.  RANGE OF MOTION - Ankle Alphabet  Imagine your right / left big toe is a pen.  Keeping your hip and knee still, write out the entire alphabet with your "pen." Make the letters as large as you can without increasing any discomfort. Repeat __________ times. Complete this exercise __________ times per day.  STRENGTHENING EXERCISES - Ankle Sprain, Acute -Phase I,  Weeks 1 to 2 These exercises may help you when beginning to restore strength in your ankle. You will likely work on these exercises for 1 to 2 weeks after your injury. Once your physician, physical therapist, or athletic trainer sees adequate progress, he or she will advance your exercises. While completing these exercises, remember:   Muscles can gain both the endurance and  the strength needed for everyday activities through controlled exercises.  Complete these exercises as instructed by your physician, physical therapist, or athletic trainer. Progress the resistance and repetitions only as guided.  You may experience muscle soreness or fatigue, but the pain or discomfort you are trying to eliminate should never worsen during these exercises. If this pain does worsen, stop and make certain you are following the directions exactly. If the pain is still present after adjustments, discontinue the exercise until you can discuss the trouble with your clinician. STRENGTH - Dorsiflexors  Secure a rubber exercise band/tubing to a fixed object (i.e., table, pole) and loop the other end around your right / left foot.  Sit on the floor facing the fixed object. The band/tubing should be slightly tense when your foot is relaxed.  Slowly draw your foot back toward you using your ankle and toes.  Hold this position for __________ seconds. Slowly release the tension in the band and return your foot to the starting position. Repeat __________ times. Complete this exercise __________ times per day.  STRENGTH - Plantar-flexors   Sit with your right / left leg extended. Holding onto both ends of a rubber exercise band/tubing, loop it around the ball of your foot. Keep a slight tension in the band.  Slowly push your toes away from you, pointing them downward.  Hold this position for __________ seconds. Return slowly, controlling the tension in the band/tubing. Repeat __________ times. Complete this exercise  __________ times per day.  STRENGTH - Ankle Eversion  Secure one end of a rubber exercise band/tubing to a fixed object (table, pole). Loop the other end around your foot just before your toes.  Place your fists between your knees. This will focus your strengthening at your ankle.  Drawing the band/tubing across your opposite foot, slowly, pull your little toe out and up. Make sure the band/tubing is positioned to resist the entire motion.  Hold this position for __________ seconds. Have your muscles resist the band/tubing as it slowly pulls your foot back to the starting position.  Repeat __________ times. Complete this exercise __________ times per day.  STRENGTH - Ankle Inversion  Secure one end of a rubber exercise band/tubing to a fixed object (table, pole). Loop the other end around your foot just before your toes.  Place your fists between your knees. This will focus your strengthening at your ankle.  Slowly, pull your big toe up and in, making sure the band/tubing is positioned to resist the entire motion.  Hold this position for __________ seconds.  Have your muscles resist the band/tubing as it slowly pulls your foot back to the starting position. Repeat __________ times. Complete this exercises __________ times per day.  STRENGTH - Towel Curls  Sit in a chair positioned on a non-carpeted surface.  Place your right / left foot on a towel, keeping your heel on the floor.  Pull the towel toward your heel by only curling your toes. Keep your heel on the floor.  If instructed by your physician, physical therapist, or athletic trainer, add weight to the end of the towel. Repeat __________ times. Complete this exercise __________ times per day.   This information is not intended to replace advice given to you by your health care provider. Make sure you discuss any questions you have with your health care provider.   Document Released: 01/25/2005 Document Revised: 07/17/2014  Document Reviewed: 10/08/2008 [employer] Interactive Patient Education [employer]

## 2015-11-19 DIAGNOSIS — M7670 Peroneal tendinitis, unspecified leg: Secondary | ICD-10-CM | POA: Insufficient documentation

## 2015-11-19 NOTE — Progress Notes (Signed)
Patient ID: Riley LamDonna Lenart, female   DOB: [birth year], 65 y.o.   MRN: [id number]  Subjective: 65 year old female presents the office today for follow-up evaluation of left ankle pain as well as right posterior heel pain. She states the pain is about the same to last appointment. She has not been wearing the ankle brace left side and she left the office last appointment apparently without pick up the night splint. She has been using Voltaren gel that she had at home. She has been stretching intermittently. No acute changes. Denies any systemic complaints such as fevers, chills, nausea, vomiting. No acute changes since last appointment, and no other complaints at this time.   Objective: AAO x3, NAD DP/PT pulses palpable bilaterally, CRT less than 3 seconds Protective sensation intact with Simms Weinstein monofilament There is continued tenderness on the lateral aspect left ankle. The pain mostly is overlying the course of the peroneal tendons posterior to lateral malleolus. There is some slight discomfort on the course the ATFL. There is trace edema overlying the area without any erythema or increase in warmth. No area pinpoint bony tenderness or pain the vibratory sensation. Ankle and subtalar and midtarsal joint range of motion is intact. On the right side there is tenderness on the insertion of the Achilles tendon onto calcaneus and there is a retrocalcaneal exostosis palpable. There is no overlying edema, erythema. Thompson test negative. Achilles tendon intact. No areas of pinpoint bony tenderness or pain with vibratory sensation. MMT 5/5, ROM WNL. No edema, erythema, increase in warmth to bilateral lower extremities.  No open lesions or pre-ulcerative lesions.  No pain with calf compression, swelling, warmth, erythema  Assessment: 65 year old female right posterior calcaneal pain/Achilles tendinitis; left tendinitis/ankle sprain  Plan: -All treatment options discussed with the patient including  all alternatives, risks, complications.  -She has not been wearing the ankle brace and the left side. She has his home she does not want to get a new one today. Recommended to wear this. Also continue full tear and chills with icing and rehabilitation/stretching exercises. -I went ahead and dispensed the night splint today to the right side. Also heel lifted needed. Continue Voltaren gel. Stretching, icing. Also discussed shoe gear modifications and orthotics. -Follow-up as scheduled -Patient encouraged to call the office with any questions, concerns, change in symptoms.   Ovid CurdMatthew Wagoner, DPM

## 2015-12-16 ENCOUNTER — Ambulatory Visit (INDEPENDENT_AMBULATORY_CARE_PROVIDER_SITE_OTHER): Payer: Managed Care, Other (non HMO) | Admitting: Podiatry

## 2015-12-16 ENCOUNTER — Encounter: Payer: Self-pay | Admitting: Podiatry

## 2015-12-16 VITALS — BP 161/90 | HR 74 | Resp 18

## 2015-12-16 DIAGNOSIS — M7672 Peroneal tendinitis, left leg: Secondary | ICD-10-CM | POA: Diagnosis not present

## 2015-12-16 DIAGNOSIS — M7661 Achilles tendinitis, right leg: Secondary | ICD-10-CM

## 2015-12-16 DIAGNOSIS — S93402A Sprain of unspecified ligament of left ankle, initial encounter: Secondary | ICD-10-CM | POA: Diagnosis not present

## 2015-12-19 NOTE — Progress Notes (Signed)
Patient ID: Kaitlin Kelley, female   DOB: 1950-09-25, 65 y.o.   MRN: [id number]  Subjective: 65 year old female presents the office today for follow-up evaluation of left ankle pain as well as right posterior heel pain. She states that overall she is doing about the same and she does not have much relief of symptoms. She also brought in multiple devices that she has purchased stripping given over the years for her foot pain. No recent injury. No acute changes. Denies any systemic complaints such as fevers, chills, nausea, vomiting. No acute changes since last appointment, and no other complaints at this time.   Objective: AAO x3, NAD DP/PT pulses palpable bilaterally, CRT less than 3 seconds Protective sensation intact with Simms Weinstein monofilament Overall exam appears to be mostly unchanged. There is tenderness on the lateral aspect left ankle was on the course of the peroneal tendons just posterior to the lateral malleolus and along the course the ATFL. There is slight edema overlying this area without any erythema or increase in warmth. On the right side there is tenderness on the insertion Achilles tendon was a retrocalcaneal exostosis present. There is no other areas of pinpoint bony tenderness there is no pain the vibratory sensation. Thompson test negative. Achilles tendon intact. No areas of pinpoint bony tenderness or pain with vibratory sensation. MMT 5/5, ROM WNL. No edema, erythema, increase in warmth to bilateral lower extremities.  No open lesions or pre-ulcerative lesions.  No pain with calf compression, swelling, warmth, erythema  Assessment: 65 year old female right posterior calcaneal pain/Achilles tendinitis; left tendinitis/ankle sprain  Plan: -All treatment options discussed with the patient including all alternatives, risks, complications.  -She brought and a CAM boot, night splint as well as multiple braces with inserts that she has had over the years. This appears to be  more of a chronic issue at this time. I discussed with her other treatment options. At this point we'll start physical therapy and a prescription was provided today. I discussed custom bracing however will start with physical therapy to see how this does. Follow up in 4 weeks or sooner if any issues are to arise or any worsening.   Ovid CurdMatthew Ayeisha Lindenberger, DPM

## 2016-01-18 ENCOUNTER — Ambulatory Visit: Payer: Managed Care, Other (non HMO) | Admitting: Podiatry

## 2016-01-25 ENCOUNTER — Ambulatory Visit (INDEPENDENT_AMBULATORY_CARE_PROVIDER_SITE_OTHER): Payer: Managed Care, Other (non HMO) | Admitting: Podiatry

## 2016-01-25 ENCOUNTER — Encounter: Payer: Self-pay | Admitting: Podiatry

## 2016-01-25 DIAGNOSIS — S93402A Sprain of unspecified ligament of left ankle, initial encounter: Secondary | ICD-10-CM

## 2016-01-25 DIAGNOSIS — M7672 Peroneal tendinitis, left leg: Secondary | ICD-10-CM | POA: Diagnosis not present

## 2016-01-25 DIAGNOSIS — M7661 Achilles tendinitis, right leg: Secondary | ICD-10-CM

## 2016-02-03 NOTE — Progress Notes (Signed)
Patient ID: Kaitlin Kelley, female   DOB: 1950-09-17, 65 y.o.   MRN: [id number]  Subjective: 65 year old female presents the office today for follow-up evaluation of left ankle pain as well as right posterior heel pain. She feels that overall her pain is about the same but may be somewhat better. She has not yet started physical therapy. No recent injury. No acute changes. Denies any systemic complaints such as fevers, chills, nausea, vomiting. No acute changes since last appointment, and no other complaints at this time.   Objective: AAO x3, NAD DP/PT pulses palpable bilaterally, CRT less than 3 seconds Protective sensation intact with Simms Weinstein monofilament Overall exam appears to be mostly unchanged. There is tenderness on the lateral aspect left ankle was on the course of the peroneal tendons just posterior to the lateral malleolus and along the course the ATFL. There is slight edema overlying this area without any erythema or increase in warmth. On the right side there is tenderness on the insertion Achilles tendon was a retrocalcaneal exostosis present. There is no other areas of pinpoint bony tenderness there is no pain the vibratory sensation. Thompson test negative. Achilles tendon intact. No areas of pinpoint bony tenderness or pain with vibratory sensation. MMT 5/5, ROM WNL. No edema, erythema, increase in warmth to bilateral lower extremities.  No open lesions or pre-ulcerative lesions.  No pain with calf compression, swelling, warmth, erythema  Assessment: 65 year old female right posterior calcaneal pain/Achilles tendinitis; left tendinitis/ankle sprain  Plan: -All treatment options discussed with the patient including all alternatives, risks, complications.  -she has not yet started PT and I have recommed to start with this. Discussed possible surgical intervention but she wishes to hold off. Discussed steroid injection.  -Follow-up after PT. Call with any questions or  concerns.   Ovid Curd, DPM

## 2016-03-07 ENCOUNTER — Ambulatory Visit (INDEPENDENT_AMBULATORY_CARE_PROVIDER_SITE_OTHER): Payer: Medicare HMO | Admitting: Podiatry

## 2016-03-07 ENCOUNTER — Encounter: Payer: Self-pay | Admitting: Podiatry

## 2016-03-07 DIAGNOSIS — M7661 Achilles tendinitis, right leg: Secondary | ICD-10-CM

## 2016-03-07 DIAGNOSIS — S93402D Sprain of unspecified ligament of left ankle, subsequent encounter: Secondary | ICD-10-CM | POA: Diagnosis not present

## 2016-03-20 NOTE — Progress Notes (Signed)
Patient ID: Kaitlin Kelley, female   DOB: 19-Dec-1950, 65 y.o.   MRN: [id number]  Subjective: 65 year old female presents the office today for follow-up evaluation of left ankle pain as well as right posterior heel pain. She states that she is doing much better since last appointment she did. New shoes. She states of the back of the right heel is still somewhat uncomfortable but she describes it annoying as opposed to discomfort or painful. She is also to continue with stretching and icing exercises. Denies any systemic complaints such as fevers, chills, nausea, vomiting. No acute changes since last appointment, and no other complaints at this time.   Objective: AAO x3, NAD DP/PT pulses palpable bilaterally, CRT less than 3 seconds Protective sensation intact with Simms Weinstein monofilament There is no significant or tenderness on the lateral aspect left ankle was on the course of the peroneal tendons just posterior to the lateral malleolus and along the course the ATFL. There is trace edema overlying this area without any erythema or increase in warmth. On the right side there is decreased tenderness on the insertion Achilles tendon was a retrocalcaneal exostosis present. There is no other areas of pinpoint bony tenderness there is no pain the vibratory sensation. Thompson test negative. Achilles tendon intact. No areas of pinpoint bony tenderness or pain with vibratory sensation. MMT 5/5, ROM WNL. No edema, erythema, increase in warmth to bilateral lower extremities.  No open lesions or pre-ulcerative lesions.  No pain with calf compression, swelling, warmth, erythema  Assessment: 65 year old female right posterior calcaneal pain/Achilles tendinitis; left tendinitis/ankle sprain both of which are improved.  Plan: -All treatment options discussed with the patient including all alternatives, risks, complications.  -Continue with stretching, rehabilitation exercises which I discussed with her as  well as other treatment. Continue with supportive shoe gear and discuss orthotics. She did not go to physical therapy but discussed this with her. -Follow-up as scheduled or sooner if needed. Call any questions concerns meantime.  Kaitlin Kelley, DPM

## 2017-03-25 ENCOUNTER — Encounter: Payer: Self-pay | Admitting: Emergency Medicine

## 2017-03-25 ENCOUNTER — Emergency Department: Payer: Medicare HMO

## 2017-03-25 ENCOUNTER — Emergency Department
Admission: EM | Admit: 2017-03-25 | Discharge: 2017-03-25 | Disposition: A | Discharge status: Home / Self Care | Payer: Medicare HMO | Attending: Emergency Medicine | Admitting: Emergency Medicine

## 2017-03-25 DIAGNOSIS — R109 Unspecified abdominal pain: Secondary | ICD-10-CM | POA: Diagnosis present

## 2017-03-25 DIAGNOSIS — R1030 Lower abdominal pain, unspecified: Secondary | ICD-10-CM

## 2017-03-25 DIAGNOSIS — Z79899 Other long term (current) drug therapy: Secondary | ICD-10-CM | POA: Insufficient documentation

## 2017-03-25 DIAGNOSIS — Z794 Long term (current) use of insulin: Secondary | ICD-10-CM | POA: Diagnosis not present

## 2017-03-25 DIAGNOSIS — Z9884 Bariatric surgery status: Secondary | ICD-10-CM | POA: Diagnosis not present

## 2017-03-25 DIAGNOSIS — Z7982 Long term (current) use of aspirin: Secondary | ICD-10-CM | POA: Diagnosis not present

## 2017-03-25 DIAGNOSIS — I10 Essential (primary) hypertension: Secondary | ICD-10-CM | POA: Diagnosis not present

## 2017-03-25 DIAGNOSIS — E119 Type 2 diabetes mellitus without complications: Secondary | ICD-10-CM | POA: Diagnosis not present

## 2017-03-25 DIAGNOSIS — K5900 Constipation, unspecified: Secondary | ICD-10-CM

## 2017-03-25 DIAGNOSIS — E039 Hypothyroidism, unspecified: Secondary | ICD-10-CM | POA: Insufficient documentation

## 2017-03-25 LAB — CBC
HCT: 44.9 % (ref 35.0–47.0)
HEMOGLOBIN: 15.4 g/dL (ref 12.0–16.0)
MCH: 32.4 pg (ref 26.0–34.0)
MCHC: 34.3 g/dL (ref 32.0–36.0)
MCV: 94.4 fL (ref 80.0–100.0)
Platelets: 366 10*3/uL (ref 150–440)
RBC: 4.76 MIL/uL (ref 3.80–5.20)
RDW: 12.5 % (ref 11.5–14.5)
WBC: 12 10*3/uL — AB (ref 3.6–11.0)

## 2017-03-25 LAB — URINALYSIS, COMPLETE (UACMP) WITH MICROSCOPIC
BILIRUBIN URINE: NEGATIVE
Bacteria, UA: NONE SEEN
Glucose, UA: NEGATIVE mg/dL
HGB URINE DIPSTICK: NEGATIVE
KETONES UR: NEGATIVE mg/dL
LEUKOCYTES UA: NEGATIVE
Nitrite: NEGATIVE
PH: 5 (ref 5.0–8.0)
Protein, ur: NEGATIVE mg/dL
Specific Gravity, Urine: 1.015 (ref 1.005–1.030)

## 2017-03-25 LAB — COMPREHENSIVE METABOLIC PANEL
ALT: 26 U/L (ref 14–54)
ANION GAP: 15 (ref 5–15)
AST: 32 U/L (ref 15–41)
Albumin: 4.3 g/dL (ref 3.5–5.0)
Alkaline Phosphatase: 79 U/L (ref 38–126)
BUN: 46 mg/dL — ABNORMAL HIGH (ref 6–20)
CHLORIDE: 102 mmol/L (ref 101–111)
CO2: 21 mmol/L — AB (ref 22–32)
Calcium: 10.1 mg/dL (ref 8.9–10.3)
Creatinine, Ser: 1.35 mg/dL — ABNORMAL HIGH (ref 0.44–1.00)
GFR calc Af Amer: 46 mL/min — ABNORMAL LOW (ref >60)
GFR calc non Af Amer: 39 mL/min — ABNORMAL LOW (ref >60)
Glucose, Bld: 196 mg/dL — ABNORMAL HIGH (ref 65–99)
POTASSIUM: 4.3 mmol/L (ref 3.5–5.1)
Sodium: 138 mmol/L (ref 135–145)
Total Bilirubin: 0.8 mg/dL (ref 0.3–1.2)
Total Protein: 7.7 g/dL (ref 6.5–8.1)

## 2017-03-25 LAB — LIPASE, BLOOD: Lipase: 37 U/L (ref 11–51)

## 2017-03-25 MED ORDER — ONDANSETRON HCL 4 MG/2ML IJ SOLN
4.0000 mg | Freq: Once | INTRAMUSCULAR | Status: AC
  Administered 2017-03-25: 4 mg via INTRAVENOUS
  Filled 2017-03-25: qty 2

## 2017-03-25 MED ORDER — IOPAMIDOL (ISOVUE-300) INJECTION 61%
80.0000 mL | Freq: Once | INTRAVENOUS | Status: AC | PRN
  Administered 2017-03-25: 80 mL via INTRAVENOUS

## 2017-03-25 MED ORDER — FENTANYL CITRATE (PF) 100 MCG/2ML IJ SOLN
12.5000 ug | Freq: Once | INTRAMUSCULAR | Status: AC
Start: 2017-03-25 — End: 2017-03-25
  Administered 2017-03-25: 12.5 ug via INTRAVENOUS
  Filled 2017-03-25: qty 2

## 2017-03-25 MED ORDER — IOPAMIDOL (ISOVUE-300) INJECTION 61%
30.0000 mL | Freq: Once | INTRAVENOUS | Status: AC | PRN
  Administered 2017-03-25: 30 mL via ORAL

## 2017-03-25 NOTE — ED Provider Notes (Signed)
-----------------------------------------   3:07 PM on 03/25/2017 -----------------------------------------   Blood pressure 139/82, pulse 66, temperature 98.7 F (37.1 C), temperature source Oral, resp. rate (!) 25, height  (1.676 m), weight 98.9 kg (218 lb), SpO2 99 %.  Assuming care from Dr. Cyril Loosen of [place] is a 66 y.o. female with a chief complaint of Abdominal Pain .    Please refer to H&P by previous MD for further details.  The current plan of care is to f/u CT a/p and reassess.  _________________________ 5:32 PM on 03/25/2017 -----------------------------------------  CT Abdomen Pelvis W Contrast (Final result)  Result time 03/25/17 17:13:08  Final result by Janice Coffin, MD (03/25/17 17:13:08)           Narrative:   CLINICAL DATA: Abdominal pain, diverticulitis suspected. Denies nausea and vomiting.  EXAM: CT ABDOMEN AND PELVIS WITH CONTRAST  TECHNIQUE: Multidetector CT imaging of the abdomen and pelvis was performed using the standard protocol following bolus administration of intravenous contrast.  CONTRAST: 80mL ISOVUE-300 IOPAMIDOL (ISOVUE-300) INJECTION 61%  COMPARISON: 12/06/2009 abdomen ultrasound  FINDINGS: Lower chest: Normal size heart minimal coronary arteriosclerosis along the LAD and circumflex. Aortic atherosclerosis at the root. Bibasilar dependent atelectasis.  Hepatobiliary: Dependent calculi are identified within the gallbladder which is moderately distended but without wall thickening or pericholecystic fluid. This could be due to a fasting state. The liver is free of enhancing mass lesions. No significant biliary dilatation.  Pancreas: Normal  Spleen: Normal  Adrenals/Urinary Tract: Normal bilateral adrenal glands and kidneys without obstructive uropathy or nephrolithiasis. Physiologic distention of the bladder without focal mural thickening or calculi.  Stomach/Bowel: Small hiatal hernia. Status post  bariatric surgery without reflux of enteric contrast into the biliary limb and excluded stomach. The excluded stomach is decompressed in appearance. No extravasation of contrast. No bowel obstruction is seen. A moderate amount of fecal residue is seen within the right colon and rectosigmoid.  Vascular/Lymphatic: Aortic atherosclerosis. No enlarged abdominal or pelvic lymph nodes.  Reproductive: Normal uterus and adnexa.  Other: No free air. Trace physiologic free fluid in the pelvis.  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 No acute osseous abnormality.  IMPRESSION: 1. Coronary arteriosclerosis with aortic atherosclerosis. No aneurysm. 2. Uncomplicated cholelithiasis. 3. 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 No bowel inflammation. Moderate colonic stool burden. 4. Lower lumbar spondylosis.           Ct showing moderate stool burden with no other acute findings. Patient reports that the pain is crampy in nature, intermittent located mostly in the lower quadrants. No pain at this time. Abdominal exam shows soft abdomen with no tenderness. Discussed close follow-up for reevaluation in 12-24 hours if patient remains with abdominal pain. Discussed reasons to return to the emergency room such as constant pain, worsening pain, vomiting, fever. Otherwise recommend patient take MiraLAX for the next 3-5 days to help with her moderate stool burden which could be a possible etiology of her pain.      Nita Sickle, MD 03/25/17 [phone]

## 2017-03-25 NOTE — ED Notes (Signed)
Urine sample recollected

## 2017-03-25 NOTE — ED Notes (Signed)
Patient transported to CT 

## 2017-03-25 NOTE — ED Triage Notes (Signed)
Pt presents to ED via [facility] with c/o sudden onset lower abdominal pain while she was on the way to [employer]. Per EMS pt tried to use the bathroom [place] [employer] but was unsuccessful. EMS reports lower abdominal pain, pt is noted to be holding her lower abdomen at this time. Pt reports hx of colitis and gastric bypass approx 1.5 yrs ago. EMS also reports that patient was diaphoretic on arrival. Pt is noted to be alert and oriented, but noted to be moaning and groaning while holding her stomach. Pt states the last time she had this pain she had colitis.

## 2017-03-25 NOTE — ED Notes (Signed)
Pt discharged to home.  Family member driving.  Discharge instructions reviewed.  Verbalized understanding.  No questions or concerns at this time.  Teach back verified.  Pt in NAD.  No items left in ED.   

## 2017-03-25 NOTE — ED Notes (Signed)
Urine sample was not enough to test - will have pt attempt a recollect the next time she has to void

## 2017-03-25 NOTE — ED Provider Notes (Signed)
[facility] Emergency Department Provider Note   ____________________________________________    I have reviewed the triage vital signs and the nursing notes.   HISTORY  Chief Complaint Abdominal Pain     HPI Kaitlin Kelley is a 66 y.o. female who presents with abdominal pain. Patient reports she developed severe lower abdominal cramping sensation approximately one hour prior to arrival. She describes a history of gastric bypass 1.5 years ago, notes she is doing a "reset "with her doctor which entails protein shakes only and fiber for the last 5 days. She reports she has had similar cramping sensations in her lower abdomen the past but not this severe. No vomiting.   Past Medical History:  Diagnosis Date  . Arthritis   . Chronic cough   . Depression   . Diabetes mellitus without complication (HCC)   . Edema    LEGS/FEET  . GERD (gastroesophageal reflux disease)   . HOH (hard of hearing)   . Hypertension   . Hypothyroidism   . IBS (irritable bowel syndrome)   . Sleep apnea    CPAP    Patient Active Problem List   Diagnosis Date Noted  . Peroneal tendonitis 11/19/2015  . Heel spur 10/05/2015  . Tendonitis, Achilles, right 10/05/2015  . Ankle sprain 10/05/2015    Past Surgical History:  Procedure Laterality Date  . EYE SURGERY    . GAS INSERTION Left 03/17/2015   Procedure: INSERTION OF GAS;  Surgeon: Marcelene Butte, MD;  Location: [facility] ORS;  Service: Ophthalmology;  Laterality: Left;  [id number]  . GASTRIC BYPASS    . PARS PLANA VITRECTOMY Left 03/17/2015   Procedure: PARS PLANA VITRECTOMY WITH 25 GAUGE;  Surgeon: Marcelene Butte, MD;  Location: [facility] ORS;  Service: Ophthalmology;  Laterality: Left;  . SHOULDER ARTHROSCOPY    . TONSILLECTOMY    . TUBAL LIGATION      Prior to Admission medications   Medication Sig Start Date End Date Taking? Authorizing Provider  ASPIRIN PO Take by mouth.    [provider]  BuPROPion HCl  (WELLBUTRIN PO) Take 100 mg by mouth 2 (two) times daily.     [provider]  diclofenac (VOLTAREN) 50 MG EC tablet Take 50 mg by mouth 2 (two) times daily.    [provider]  dicyclomine (BENTYL) 20 MG tablet Take 1 tablet (20 mg total) by mouth 3 (three) times daily as needed for spasms. 05/02/15   Sharman Cheek, MD  DULoxetine HCl (CYMBALTA PO) Take 60 mg by mouth daily.     [provider]  fluticasone (FLONASE) 50 MCG/ACT nasal spray Place 1 spray into both nostrils daily. AS NEEDED    [provider]  GLYBURIDE PO Take 5 mg by mouth 2 (two) times daily.     [provider]  Insulin Glargine (LANTUS [place]) Inject into the skin at bedtime.     [provider]  levothyroxine (SYNTHROID, LEVOTHROID) 88 MCG tablet Take 88 mcg by mouth daily before breakfast.    [provider]  Liraglutide (VICTOZA [place]) Inject into the skin every morning.     [provider]  lisinopril (PRINIVIL,ZESTRIL) 10 MG tablet Take 10 mg by mouth daily. HS    [provider]  Multiple Vitamins-Minerals (MULTIVITAMIN PO) Take by mouth.    [provider]  oxyCODONE-acetaminophen (ROXICET) 5-325 MG tablet Take 1 tablet by mouth every 6 (six) hours as needed for severe pain. 05/02/15   Sharman Cheek, MD  SIMVASTATIN PO Take 40 mg by mouth at bedtime.     [provider]     Allergies Patient has no known allergies.  History reviewed. No pertinent family history.  Social History Social History  Substance Use Topics  . Smoking status: Never Smoker  . Smokeless tobacco: Never Used  . Alcohol use No    Review of Systems  Constitutional: No fever/chills Eyes: No visual changes.  ENT: No sore throat. Cardiovascular: Denies chest pain. Respiratory: Denies shortness of breath. Gastrointestinal: As above Genitourinary: Negative for dysuria. Musculoskeletal: Negative for back pain. Skin: Negative for rash.  Diaphoresis reported by husband Neurological: Negative for headaches    ____________________________________________   PHYSICAL EXAM:  VITAL SIGNS: ED Triage Vitals  Enc Vitals Group     BP 03/25/17 1310 100/64     Pulse Rate 03/25/17 1310 (!) 59     Resp 03/25/17 1310 20     Temp 03/25/17 1310 98.7 F (37.1 C)     Temp Source 03/25/17 1310 Oral     SpO2 03/25/17 1304 95 %     Weight 03/25/17 1305 98.9 kg (218 lb)     Height 03/25/17 1305 1.676 m ( )     Head Circumference --      Peak Flow --      Pain Score 03/25/17 1305 10     Pain Loc --      Pain Edu? --      Excl. in GC? --     Constitutional: Alert and oriented. No acute distress. Pleasant and interactive Eyes: Conjunctivae are normal.   Nose: No congestion/rhinnorhea. Mouth/Throat: Mucous membranes are moist.    Cardiovascular: Normal rate, regular rhythm. Grossly normal heart sounds.  Good peripheral circulation. Respiratory: Normal respiratory effort.  No retractions. Lungs CTAB. Gastrointestinal: Minimal tenderness to palpation in the lower abdomen. No distention.  No CVA tenderness. Genitourinary: deferred Musculoskeletal:   Warm and well perfused Neurologic:  Normal speech and language. No gross focal neurologic deficits are appreciated.  Skin:  Skin is warm, dry and intact. No rash noted. Psychiatric: Mood and affect are normal. Speech and behavior are normal.  ____________________________________________   LABS (all labs ordered are listed, but only abnormal results are displayed)  Labs Reviewed  COMPREHENSIVE METABOLIC PANEL - Abnormal; Notable for the following:       Result Value   CO2 21 (*)    Glucose, Bld 196 (*)    BUN 46 (*)    Creatinine, Ser 1.35 (*)    GFR calc non Af Amer 39 (*)    GFR calc Af Amer 46 (*)    All other components within normal limits  CBC - Abnormal; Notable for the following:    WBC 12.0 (*)    All other components within normal limits  LIPASE, BLOOD    URINALYSIS, COMPLETE (UACMP) WITH MICROSCOPIC   ____________________________________________  EKG  None ____________________________________________  RADIOLOGY  CT abdomen pelvis pending ____________________________________________   PROCEDURES  Procedure(s) performed: No    Critical Care performed: No ____________________________________________   INITIAL IMPRESSION / ASSESSMENT AND PLAN / ED COURSE  Pertinent labs & imaging results that were available during my care of the patient were reviewed by me and considered in my medical decision making (see chart for details).  Patient well-appearing and in no acute distress. She had a bowel movement in the ED which apparently relieved her pain significantly. We will check labs and monitor the patient. Differential includes diverticulitis/colitis/gas pains related  to fiber and protein intake  Lab work is unremarkable.  ----------------------------------------- 3:08 PM on 03/25/2017 -----------------------------------------  Patient reports pain is returning. We will give IV analgesics, Zofran and obtain CT imaging. I have asked Dr. Don Perking to f/u on the CT     ____________________________________________   FINAL CLINICAL IMPRESSION(S) / ED DIAGNOSES  Final diagnoses:  Lower abdominal pain      NEW MEDICATIONS STARTED DURING THIS VISIT:  New Prescriptions   No medications on file     Note:  This document was prepared using Dragon voice recognition software and may include unintentional dictation errors.    Jene Every, MD 03/25/17 [phone]

## 2017-03-25 NOTE — ED Notes (Signed)
Pt assisted to toilet to void and have BM - pt had a large BM and stated some relief from abd pain

## 2017-03-25 NOTE — Discharge Instructions (Signed)
You have been seen in the Emergency Department (ED) for abdominal pain.  Your evaluation did not identify a clear cause of your symptoms but was generally reassuring.  Abdominal pain has many possible causes. Some aren't serious and get better on their own in a few days. Others need more testing and treatment. If your pain continues or gets worse, you need to be rechecked and may need more tests to find out what is wrong. You may need surgery to correct the problem.   Constipation: Take 1 cap full of Miralax in the morning and one in the evening up to 5 days. Drink plenty of fluids.   Follow up with your doctor in 12-24 hours if you are still having abdominal pain. Otherwise follow up in 1-3 days for a re-check  Don't ignore new symptoms, such as fever, nausea and vomiting, new or worsening abdominal pain, urination problems, bloody diarrhea or bloody stools, black tarry stools, uncontrollable nausea and vomiting, and dizziness. These may be signs of a more serious problem. If you develop any of these you should be seen by your doctor immediately or return to the ED.   How can you care for yourself at home?  Rest until you feel better.  To prevent dehydration, drink plenty of fluids, enough so that your urine is light yellow or clear like water. Choose water and other caffeine-free clear liquids until you feel better. If you have kidney, heart, or liver disease and have to limit fluids, talk with your doctor before you increase the amount of fluids you drink.  If your stomach is upset, eat mild foods, such as rice, dry toast or crackers, bananas, and applesauce. Try eating several small meals instead of two or three large ones.  Wait until 48 hours after all symptoms have gone away before you have spicy foods, alcohol, and drinks that contain caffeine.  Do not eat foods that are high in fat.  Avoid anti-inflammatory medicines such as aspirin, ibuprofen (Advil, Motrin), and naproxen (Aleve). These  can cause stomach upset. Talk to your doctor if you take daily aspirin for another health problem.  When should you call for help?  Call 911 anytime you think you may need emergency care. For example, call if:  You passed out (lost consciousness).  You pass maroon or very bloody stools.  You vomit blood or what looks like coffee grounds.  You have new, severe belly pain.  Call your doctor now or seek immediate medical care if:  Your pain gets worse, especially if it becomes focused in one area of your belly.  You have a new or higher fever.  Your stools are black and look like tar, or they have streaks of blood.  You have unexpected vaginal bleeding.  You have symptoms of a urinary tract infection. These may include:  Pain when you urinate.  Urinating more often than usual.  Blood in your urine. You are dizzy or lightheaded, or you feel like you may faint. Watch closely for changes in your health, and be sure to contact your doctor if:  You are not getting better after 1 day (24 hours).

## 2018-02-13 ENCOUNTER — Other Ambulatory Visit (INDEPENDENT_AMBULATORY_CARE_PROVIDER_SITE_OTHER): Payer: Self-pay

## 2018-02-13 MED ORDER — MELOXICAM 7.5 MG PO TABS: 7.5000 mg | ORAL_TABLET | Freq: Every day | ORAL | 1 refills | Status: DC

## 2018-02-24 ENCOUNTER — Encounter: Payer: Self-pay | Admitting: *Deleted

## 2018-02-24 ENCOUNTER — Other Ambulatory Visit: Payer: Self-pay

## 2018-02-24 ENCOUNTER — Emergency Department: Payer: Medicare HMO

## 2018-02-24 ENCOUNTER — Inpatient Hospital Stay
Admission: EM | Admit: 2018-02-24 | Discharge: 2018-02-26 | DRG: 872 | Disposition: A | Discharge status: Home / Self Care | Payer: Medicare HMO | Attending: Internal Medicine | Admitting: Internal Medicine

## 2018-02-24 DIAGNOSIS — F329 Major depressive disorder, single episode, unspecified: Secondary | ICD-10-CM | POA: Diagnosis present

## 2018-02-24 DIAGNOSIS — Z79899 Other long term (current) drug therapy: Secondary | ICD-10-CM

## 2018-02-24 DIAGNOSIS — N179 Acute kidney failure, unspecified: Secondary | ICD-10-CM | POA: Diagnosis present

## 2018-02-24 DIAGNOSIS — E1165 Type 2 diabetes mellitus with hyperglycemia: Secondary | ICD-10-CM | POA: Diagnosis present

## 2018-02-24 DIAGNOSIS — Z794 Long term (current) use of insulin: Secondary | ICD-10-CM

## 2018-02-24 DIAGNOSIS — Z9989 Dependence on other enabling machines and devices: Secondary | ICD-10-CM

## 2018-02-24 DIAGNOSIS — A419 Sepsis, unspecified organism: Principal | ICD-10-CM | POA: Diagnosis present

## 2018-02-24 DIAGNOSIS — Z791 Long term (current) use of non-steroidal anti-inflammatories (NSAID): Secondary | ICD-10-CM

## 2018-02-24 DIAGNOSIS — E860 Dehydration: Secondary | ICD-10-CM | POA: Diagnosis present

## 2018-02-24 DIAGNOSIS — M1990 Unspecified osteoarthritis, unspecified site: Secondary | ICD-10-CM | POA: Diagnosis present

## 2018-02-24 DIAGNOSIS — K219 Gastro-esophageal reflux disease without esophagitis: Secondary | ICD-10-CM | POA: Diagnosis present

## 2018-02-24 DIAGNOSIS — I10 Essential (primary) hypertension: Secondary | ICD-10-CM | POA: Diagnosis present

## 2018-02-24 DIAGNOSIS — Z9884 Bariatric surgery status: Secondary | ICD-10-CM

## 2018-02-24 DIAGNOSIS — E039 Hypothyroidism, unspecified: Secondary | ICD-10-CM | POA: Diagnosis present

## 2018-02-24 DIAGNOSIS — K529 Noninfective gastroenteritis and colitis, unspecified: Secondary | ICD-10-CM | POA: Diagnosis present

## 2018-02-24 DIAGNOSIS — G4733 Obstructive sleep apnea (adult) (pediatric): Secondary | ICD-10-CM | POA: Diagnosis present

## 2018-02-24 DIAGNOSIS — H9190 Unspecified hearing loss, unspecified ear: Secondary | ICD-10-CM | POA: Diagnosis present

## 2018-02-24 DIAGNOSIS — Z7982 Long term (current) use of aspirin: Secondary | ICD-10-CM | POA: Diagnosis not present

## 2018-02-24 LAB — URINALYSIS, COMPLETE (UACMP) WITH MICROSCOPIC
BACTERIA UA: NONE SEEN
GLUCOSE, UA: 500 mg/dL — AB
Leukocytes, UA: NEGATIVE
Nitrite: NEGATIVE
PROTEIN: 100 mg/dL — AB
Specific Gravity, Urine: 1.02 (ref 1.005–1.030)
pH: 5 (ref 5.0–8.0)

## 2018-02-24 LAB — GASTROINTESTINAL PANEL BY PCR, STOOL (REPLACES STOOL CULTURE)
Adenovirus F40/41: NOT DETECTED
Astrovirus: NOT DETECTED
CRYPTOSPORIDIUM: NOT DETECTED
Campylobacter species: NOT DETECTED
Cyclospora cayetanensis: NOT DETECTED
Entamoeba histolytica: NOT DETECTED
Enteroaggregative E coli (EAEC): NOT DETECTED
Enteropathogenic E coli (EPEC): NOT DETECTED
Enterotoxigenic E coli (ETEC): NOT DETECTED
Giardia lamblia: NOT DETECTED
NOROVIRUS GI/GII: NOT DETECTED
PLESIMONAS SHIGELLOIDES: NOT DETECTED
ROTAVIRUS A: NOT DETECTED
SAPOVIRUS (I, II, IV, AND V): NOT DETECTED
SHIGA LIKE TOXIN PRODUCING E COLI (STEC): NOT DETECTED
SHIGELLA/ENTEROINVASIVE E COLI (EIEC): NOT DETECTED
Salmonella species: NOT DETECTED
Vibrio cholerae: NOT DETECTED
Vibrio species: NOT DETECTED
YERSINIA ENTEROCOLITICA: NOT DETECTED

## 2018-02-24 LAB — COMPREHENSIVE METABOLIC PANEL
ALK PHOS: 108 U/L (ref 38–126)
ALT: 26 U/L (ref 0–44)
AST: 35 U/L (ref 15–41)
Albumin: 4.2 g/dL (ref 3.5–5.0)
Anion gap: 13 (ref 5–15)
BILIRUBIN TOTAL: 0.8 mg/dL (ref 0.3–1.2)
BUN: 28 mg/dL — AB (ref 8–23)
CALCIUM: 9.4 mg/dL (ref 8.9–10.3)
CO2: 16 mmol/L — ABNORMAL LOW (ref 22–32)
CREATININE: 1.67 mg/dL — AB (ref 0.44–1.00)
Chloride: 107 mmol/L (ref 98–111)
GFR, EST AFRICAN AMERICAN: 35 mL/min — AB (ref >60)
GFR, EST NON AFRICAN AMERICAN: 30 mL/min — AB (ref >60)
Glucose, Bld: 457 mg/dL — ABNORMAL HIGH (ref 70–99)
Potassium: 4.2 mmol/L (ref 3.5–5.1)
Sodium: 136 mmol/L (ref 135–145)
Total Protein: 7.7 g/dL (ref 6.5–8.1)

## 2018-02-24 LAB — GLUCOSE, CAPILLARY: Glucose-Capillary: 377 mg/dL — ABNORMAL HIGH (ref 70–99)

## 2018-02-24 LAB — CBC WITH DIFFERENTIAL/PLATELET
Basophils Absolute: 0.1 10*3/uL (ref 0–0.1)
Basophils Relative: 1 %
EOS ABS: 0.1 10*3/uL (ref 0–0.7)
EOS PCT: 1 %
HCT: 48.7 % — ABNORMAL HIGH (ref 35.0–47.0)
HEMOGLOBIN: 16.8 g/dL — AB (ref 12.0–16.0)
LYMPHS ABS: 3.3 10*3/uL (ref 1.0–3.6)
LYMPHS PCT: 38 %
MCH: 32.9 pg (ref 26.0–34.0)
MCHC: 34.6 g/dL (ref 32.0–36.0)
MCV: 95.3 fL (ref 80.0–100.0)
MONOS PCT: 3 %
Monocytes Absolute: 0.3 10*3/uL (ref 0.2–0.9)
Neutro Abs: 5.1 10*3/uL (ref 1.4–6.5)
Neutrophils Relative %: 57 %
PLATELETS: 453 10*3/uL — AB (ref 150–440)
RBC: 5.11 MIL/uL (ref 3.80–5.20)
RDW: 13.4 % (ref 11.5–14.5)
WBC: 8.8 10*3/uL (ref 3.6–11.0)

## 2018-02-24 LAB — C DIFFICILE QUICK SCREEN W PCR REFLEX
C DIFFICILE (CDIFF) INTERP: NOT DETECTED
C DIFFICILE (CDIFF) TOXIN: NEGATIVE
C DIFFICLE (CDIFF) ANTIGEN: NEGATIVE

## 2018-02-24 LAB — LIPASE, BLOOD: LIPASE: 52 U/L — AB (ref 11–51)

## 2018-02-24 LAB — TROPONIN I

## 2018-02-24 LAB — LACTIC ACID, PLASMA
Lactic Acid, Venous: 3.6 mmol/L (ref 0.5–1.9)
Lactic Acid, Venous: 2 mmol/L (ref 0.5–1.9)

## 2018-02-24 MED ORDER — MORPHINE SULFATE (PF) 4 MG/ML IV SOLN
4.0000 mg | Freq: Once | INTRAVENOUS | Status: AC
  Administered 2018-02-24: 4 mg via INTRAVENOUS
  Filled 2018-02-24: qty 1

## 2018-02-24 MED ORDER — ACETAMINOPHEN 325 MG PO TABS: 650.0000 mg | ORAL_TABLET | Freq: Four times a day (QID) | ORAL | Status: DC | PRN

## 2018-02-24 MED ORDER — ENOXAPARIN SODIUM 30 MG/0.3ML ~~LOC~~ SOLN
30.0000 mg | SUBCUTANEOUS | Status: DC
  Administered 2018-02-25: 30 mg via SUBCUTANEOUS
  Filled 2018-02-24: qty 0.3

## 2018-02-24 MED ORDER — ONDANSETRON HCL 4 MG/2ML IJ SOLN
4.0000 mg | Freq: Once | INTRAMUSCULAR | Status: AC
  Administered 2018-02-24: 4 mg via INTRAVENOUS
  Filled 2018-02-24: qty 2

## 2018-02-24 MED ORDER — INSULIN ASPART 100 UNIT/ML ~~LOC~~ SOLN
0.0000 [IU] | Freq: Every day | SUBCUTANEOUS | Status: DC
  Administered 2018-02-25: 2 [IU] via SUBCUTANEOUS
  Administered 2018-02-25: 4 [IU] via SUBCUTANEOUS
  Filled 2018-02-24 (×2): qty 1

## 2018-02-24 MED ORDER — CIPROFLOXACIN IN D5W 400 MG/200ML IV SOLN
400.0000 mg | Freq: Two times a day (BID) | INTRAVENOUS | Status: DC
  Administered 2018-02-25 (×2): 400 mg via INTRAVENOUS
  Filled 2018-02-24 (×4): qty 200

## 2018-02-24 MED ORDER — INSULIN ASPART 100 UNIT/ML ~~LOC~~ SOLN
0.0000 [IU] | Freq: Three times a day (TID) | SUBCUTANEOUS | Status: DC
  Administered 2018-02-25 (×2): 2 [IU] via SUBCUTANEOUS
  Administered 2018-02-25: 3 [IU] via SUBCUTANEOUS
  Filled 2018-02-24 (×3): qty 1

## 2018-02-24 MED ORDER — IOPAMIDOL (ISOVUE-300) INJECTION 61%
30.0000 mL | Freq: Once | INTRAVENOUS | Status: AC | PRN
  Administered 2018-02-24: 30 mL via ORAL

## 2018-02-24 MED ORDER — METRONIDAZOLE IN NACL 5-0.79 MG/ML-% IV SOLN
500.0000 mg | Freq: Three times a day (TID) | INTRAVENOUS | Status: DC
  Administered 2018-02-25 – 2018-02-26 (×4): 500 mg via INTRAVENOUS
  Filled 2018-02-24 (×6): qty 100

## 2018-02-24 MED ORDER — CIPROFLOXACIN IN D5W 400 MG/200ML IV SOLN
400.0000 mg | Freq: Once | INTRAVENOUS | Status: AC
  Administered 2018-02-24: 400 mg via INTRAVENOUS
  Filled 2018-02-24: qty 200

## 2018-02-24 MED ORDER — LEVOTHYROXINE SODIUM 50 MCG PO TABS
75.0000 ug | ORAL_TABLET | ORAL | Status: DC
  Administered 2018-02-25 – 2018-02-26 (×2): 75 ug via ORAL
  Filled 2018-02-24 (×2): qty 2

## 2018-02-24 MED ORDER — SODIUM CHLORIDE 0.9 % IV BOLUS
1000.0000 mL | Freq: Once | INTRAVENOUS | Status: AC
  Administered 2018-02-24: 1000 mL via INTRAVENOUS

## 2018-02-24 MED ORDER — SODIUM CHLORIDE 0.9 % IV SOLN
INTRAVENOUS | Status: DC
  Administered 2018-02-24 – 2018-02-25 (×2): via INTRAVENOUS

## 2018-02-24 MED ORDER — ONDANSETRON HCL 4 MG PO TABS: 4.0000 mg | ORAL_TABLET | Freq: Four times a day (QID) | ORAL | Status: DC | PRN

## 2018-02-24 MED ORDER — DULOXETINE HCL 30 MG PO CPEP
60.0000 mg | ORAL_CAPSULE | Freq: Two times a day (BID) | ORAL | Status: DC
  Administered 2018-02-25 – 2018-02-26 (×4): 60 mg via ORAL
  Filled 2018-02-24 (×4): qty 2

## 2018-02-24 MED ORDER — IOHEXOL 300 MG/ML  SOLN
75.0000 mL | Freq: Once | INTRAMUSCULAR | Status: AC | PRN
  Administered 2018-02-24: 75 mL via INTRAVENOUS

## 2018-02-24 MED ORDER — METRONIDAZOLE IN NACL 5-0.79 MG/ML-% IV SOLN
500.0000 mg | Freq: Once | INTRAVENOUS | Status: AC
  Administered 2018-02-24: 500 mg via INTRAVENOUS
  Filled 2018-02-24: qty 100

## 2018-02-24 MED ORDER — ACETAMINOPHEN 650 MG RE SUPP: 650.0000 mg | Freq: Four times a day (QID) | RECTAL | Status: DC | PRN

## 2018-02-24 MED ORDER — ONDANSETRON HCL 4 MG/2ML IJ SOLN: 4.0000 mg | Freq: Four times a day (QID) | INTRAMUSCULAR | Status: DC | PRN

## 2018-02-24 MED ORDER — GLIPIZIDE ER 5 MG PO TB24: 5.0000 mg | ORAL_TABLET | Freq: Two times a day (BID) | ORAL | Status: DC

## 2018-02-24 MED ORDER — GABAPENTIN 300 MG PO CAPS
600.0000 mg | ORAL_CAPSULE | Freq: Every day | ORAL | Status: DC
  Administered 2018-02-25 (×2): 600 mg via ORAL
  Filled 2018-02-24 (×2): qty 2

## 2018-02-24 MED ORDER — INSULIN DETEMIR 100 UNIT/ML ~~LOC~~ SOLN
30.0000 [IU] | Freq: Every day | SUBCUTANEOUS | Status: DC
  Administered 2018-02-25 (×2): 30 [IU] via SUBCUTANEOUS
  Filled 2018-02-24 (×3): qty 0.3

## 2018-02-24 MED ORDER — TRAMADOL HCL 50 MG PO TABS
50.0000 mg | ORAL_TABLET | Freq: Four times a day (QID) | ORAL | Status: DC | PRN
  Administered 2018-02-25: 50 mg via ORAL
  Filled 2018-02-24: qty 1

## 2018-02-24 NOTE — ED Provider Notes (Signed)
[facility] Emergency Department Provider Note   ____________________________________________   First MD Initiated Contact with Patient 02/24/18 1427     (approximate)  I have reviewed the triage vital signs and the nursing notes.   HISTORY  Chief Complaint Abdominal Pain   HPI Kaitlin Kelley is a 67 y.o. female who comes from home.  EMS found her sitting on the toilet she was altered at the time.  She was hypotensive blood pressure in the 80s dropped down to the 60s on the way here but now it is going up again currently 120.  Patient is now responding to questions.  She says she started having abdominal pain which feels gassy today.  Is severe.  She had diarrhea here in the emergency room.  She had bowel surgery many years ago.  Otherwise she has IBS.  Past Medical History:  Diagnosis Date  . Arthritis   . Chronic cough   . Depression   . Diabetes mellitus without complication (HCC)   . Edema    LEGS/FEET  . GERD (gastroesophageal reflux disease)   . HOH (hard of hearing)   . Hypertension   . Hypothyroidism   . IBS (irritable bowel syndrome)   . Sleep apnea    CPAP    Patient Active Problem List   Diagnosis Date Noted  . Peroneal tendonitis 11/19/2015  . Heel spur 10/05/2015  . Tendonitis, Achilles, right 10/05/2015  . Ankle sprain 10/05/2015    Past Surgical History:  Procedure Laterality Date  . EYE SURGERY    . GAS INSERTION Left 03/17/2015   Procedure: INSERTION OF GAS;  Surgeon: Marcelene ButteJessica [place], MD;  Location: [facility] ORS;  Service: Ophthalmology;  Laterality: Left;  [id number]  . GASTRIC BYPASS    . PARS PLANA VITRECTOMY Left 03/17/2015   Procedure: PARS PLANA VITRECTOMY WITH 25 GAUGE;  Surgeon: Marcelene ButteJessica [address], MD;  Location: [facility] ORS;  Service: Ophthalmology;  Laterality: Left;  . SHOULDER ARTHROSCOPY    . TONSILLECTOMY    . TUBAL LIGATION      Prior to Admission medications   Medication Sig Start Date End Date Taking? Authorizing  Provider  ASPIRIN PO Take by mouth.    [provider]  BuPROPion HCl (WELLBUTRIN PO) Take 100 mg by mouth 2 (two) times daily.     [provider]  diclofenac (VOLTAREN) 50 MG EC tablet Take 50 mg by mouth 2 (two) times daily.    [provider]  DULoxetine HCl (CYMBALTA PO) Take 60 mg by mouth daily.     [provider]  fluticasone (FLONASE) 50 MCG/ACT nasal spray Place 1 spray into both nostrils daily. AS NEEDED    [provider]  GLYBURIDE PO Take 5 mg by mouth 2 (two) times daily.     [provider]  Insulin Glargine (LANTUS [place]) Inject into the skin at bedtime.     [provider]  levothyroxine (SYNTHROID, LEVOTHROID) 88 MCG tablet Take 88 mcg by mouth daily before breakfast.    [provider]  Liraglutide (VICTOZA [place]) Inject into the skin every morning.     [provider]  lisinopril (PRINIVIL,ZESTRIL) 10 MG tablet Take 10 mg by mouth daily. HS    [provider]  meloxicam (MOBIC) 7.5 MG tablet Take 1 tablet (7.5 mg total) by mouth daily. 02/13/18   Kirtland Bouchardlark, Gilbert W, PA-C  Multiple Vitamins-Minerals (MULTIVITAMIN PO) Take by mouth.    [provider]  SIMVASTATIN PO Take 40 mg  by mouth at bedtime.     [provider]    Allergies Patient has no known allergies.  No family history on file.  Social History Social History   Tobacco Use  . Smoking status: Never Smoker  . Smokeless tobacco: Never Used  Substance Use Topics  . Alcohol use: No  . Drug use: No    Review of Systems  Constitutional: No fever/chills Eyes: No visual changes. ENT: No sore throat. Cardiovascular: Denies chest pain. Respiratory: Denies shortness of breath. Gastrointestinal:  abdominal pain.  No nausea, no vomiting.   diarrhea.  No constipation. Genitourinary: Negative for dysuria. Musculoskeletal: Negative for back pain. Skin: Negative for rash. Neurological: Negative for headaches, focal  weakness ____________________________________________   PHYSICAL EXAM:  VITAL SIGNS: ED Triage Vitals  Enc Vitals Group     BP 02/24/18 1423 114/90     Pulse Rate 02/24/18 1423 97     Resp 02/24/18 1423 (!) 22     Temp 02/24/18 1423 (!) 96.6 F (35.9 C)     Temp Source 02/24/18 1423 Axillary     SpO2 02/24/18 1423 95 %     Weight 02/24/18 1426 240 lb (108.9 kg)     Height 02/24/18 1426 5\' 6"  (1.676 m)     Head Circumference --      Peak Flow --      Pain Score 02/24/18 1426 10     Pain Loc --      Pain Edu? --      Excl. in GC? --     Constitutional: Alert and oriented.  Moaning in pain and complaining of abdominal pain Eyes: Conjunctivae are normal.  Head: Atraumatic. Nose: No congestion/rhinnorhea. Mouth/Throat: Mucous membranes are moist.  Oropharynx non-erythematous. Neck: No stridor.  Cardiovascular: Normal rate, regular rhythm. Grossly normal heart sounds.  Good peripheral circulation. Respiratory: Normal respiratory effort.  No retractions. Lungs CTAB. Gastrointestinal: Soft diffusely tender no distention. No abdominal bruits. No CVA tenderness. Musculoskeletal: No lower extremity tenderness nor edema.  No joint effusions. Neurologic: Patient initially unresponsive now has normal speech but still moaning in pain. Skin:  Skin is warm, dry and intact.  Psychiatric: Mood and affect are normal. Speech and behavior are normal.  ____________________________________________   LABS (all labs ordered are listed, but only abnormal results are displayed)  Labs Reviewed  COMPREHENSIVE METABOLIC PANEL - Abnormal; Notable for the following components:      Result Value   CO2 16 (*)    Glucose, Bld 457 (*)    BUN 28 (*)    Creatinine, Ser 1.67 (*)    GFR calc non Af Amer 30 (*)    GFR calc Af Amer 35 (*)    All other components within normal limits  LIPASE, BLOOD - Abnormal; Notable for the following components:   Lipase 52 (*)    All other components within normal  limits  LACTIC ACID, PLASMA - Abnormal; Notable for the following components:   Lactic Acid, Venous 3.6 (*)    All other components within normal limits  LACTIC ACID, PLASMA - Abnormal; Notable for the following components:   Lactic Acid, Venous 2.0 (*)    All other components within normal limits  CBC WITH DIFFERENTIAL/PLATELET - Abnormal; Notable for the following components:   Hemoglobin 16.8 (*)    HCT 48.7 (*)    Platelets 453 (*)    All other components within normal limits  URINALYSIS, COMPLETE (UACMP) WITH MICROSCOPIC - Abnormal; Notable for the following  components:   APPearance CLOUDY (*)    Glucose, UA 500 (*)    Hgb urine dipstick TRACE (*)    Bilirubin Urine SMALL (*)    Ketones, ur TRACE (*)    Protein, ur 100 (*)    All other components within normal limits  GASTROINTESTINAL PANEL BY PCR, STOOL (REPLACES STOOL CULTURE)  C DIFFICILE QUICK SCREEN W PCR REFLEX  TROPONIN I   ____________________________________________  EKG   ____________________________________________  RADIOLOGY  ED MD interpretation:    Official radiology report(s): Ct Abdomen Pelvis W Contrast  Result Date: 02/24/2018 CLINICAL DATA:  67 year old female with acute abdominal pain and diarrhea. EXAM: CT ABDOMEN AND PELVIS WITH CONTRAST TECHNIQUE: Multidetector CT imaging of the abdomen and pelvis was performed using the standard protocol following bolus administration of intravenous contrast. CONTRAST:  75mL OMNIPAQUE IOHEXOL 300 MG/ML  SOLN COMPARISON:  03/25/2017 CT FINDINGS: Lower chest: No acute abnormality. Hepatobiliary: The liver is unremarkable. Cholelithiasis identified without CT evidence of acute cholecystitis. No biliary dilatation. Pancreas: Unremarkable Spleen: Unremarkable Adrenals/Urinary Tract: The kidneys, adrenal glands and bladder are unremarkable. Stomach/Bowel: Apparent mild circumferential wall thickening of the descending and proximal sigmoid colon noted and may represent  colitis. Fluid within the remainder of the colon is noted likely representing a diarrheal state. There is no evidence of small bowel dilatation. The appendix is normal. Gastric bypass changes again identified. Vascular/Lymphatic: Aortic atherosclerosis. No enlarged abdominal or pelvic lymph nodes. Reproductive: Uterus and bilateral adnexa are unremarkable. Other: No ascites, abscess or pneumoperitoneum. Musculoskeletal: No acute or suspicious abnormality. Mild degenerative changes in the lumbar spine noted. IMPRESSION: 1. Apparent mild wall thickening of the descending and proximal sigmoid colon-question colitis. No bowel obstruction or pneumoperitoneum. Fluid within the remainder of the colon compatible with diarrheal state. 2. Cholelithiasis 3. 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  Aortic Atherosclerosis (ICD10-I70.0). Electronically Signed   By: Harmon Pier M.D.   On: 02/24/2018 19:03    ____________________________________________   PROCEDURES  Procedure(s) performed: Right ultrasound done on arrival I do not see any free fluid.  Procedures  Critical Care performed:   ____________________________________________   INITIAL IMPRESSION / ASSESSMENT AND PLAN / ED COURSE  CT is consistent with colitis.  Patient still having frequent episodes of watery diarrhea.  Patient was hypotensive had a high lactic acid.  I will treat her with antibiotics and admit her.  I am although her pressure is better and her mental status is back to normal and very worried that she will deteriorate again if I discharge her now.         ____________________________________________   FINAL CLINICAL IMPRESSION(S) / ED DIAGNOSES  Final diagnoses:  Colitis     ED Discharge Orders    None       Note:  This document was prepared using Dragon voice recognition software and may include unintentional dictation errors.    Arnaldo Natal, MD 02/24/18 [phone]

## 2018-02-24 NOTE — Progress Notes (Signed)
Family Meeting Note  Advance Directive:yes  Today a meeting took place with the Patient, spouse at bedside    The following clinical team members were present during this meeting:MD  The following were discussed:Patient's diagnosis: Acute abdominal pain secondary to acute colitis, acute kidney injury, diarrhea, insulin requiring diabetes metas with hyperglycemia, hypertension, other comorbidities as documented below, treatment plan of care discussed in detail with the patient and her spouse at bedside.  They both verbalized understanding of the plan.     Arthritis    . Chronic cough   . Depression   . Diabetes mellitus without complication (HCC)   . Edema    LEGS/FEET  . GERD (gastroesophageal reflux disease)   . HOH (hard of hearing)   . Hypertension   . Hypothyroidism   . IBS (irritable bowel syndrome)   . Sleep apnea    CPAP     Patient's progosis: > 12 months and Goals for treatment: Full Code, husband is a healthcare POA  Additional follow-up to be provided: Hospitalist  Time spent during discussion:17 min  Ramonita LabAruna Sharna Gabrys, MD

## 2018-02-24 NOTE — ED Notes (Signed)
Devan RN, aware of bed assigned  

## 2018-02-24 NOTE — H&P (Signed)
[place] [facility] [place] NAME: Kaitlin Kelley    MR#:  [id number]  DATE OF BIRTH:  February 16, 1951  DATE OF ADMISSION:  02/24/2018  PRIMARY CARE PHYSICIAN: Elmer Bales, MD   REQUESTING/REFERRING PHYSICIAN: Nena Polio, MD  CHIEF COMPLAINT:  Abdominal pain  HISTORY OF PRESENT ILLNESS:  Kaitlin Kelley  is a 67 y.o. female with a known history of colitis in the past, insulin requiring diabetes mellitus, hypertension, GERD, irritable bowel syndrome and  sleep apnea using CPAP is presenting to the ED with a chief complaint of acute abdominal pain in the lower abdominal area Patient feels bloated and was hyperglycemic with blood sugar around 460 Vomited 1 time in a.m. after that she is not vomiting.  CT abdomen has revealed colitis and patient is started on IV Cipro Floxin and Flagyl and hospitalist team is called to admit the patient.  Patient is resting comfortably during my examination.  Initially patient was hypotensive but after fluid boluses blood pressure improved husband at bedside  PAST MEDICAL HISTORY:   Past Medical History:  Diagnosis Date  . Arthritis   . Chronic cough   . Depression   . Diabetes mellitus without complication ([place])   . Edema    LEGS/FEET  . GERD (gastroesophageal reflux disease)   . HOH (hard of hearing)   . Hypertension   . Hypothyroidism   . IBS (irritable bowel syndrome)   . Sleep apnea    CPAP    PAST SURGICAL HISTOIRY:   Past Surgical History:  Procedure Laterality Date  . EYE SURGERY    . GAS INSERTION Left 03/17/2015   Procedure: INSERTION OF GAS;  Surgeon: Milus Height, MD;  Location: [facility] ORS;  Service: Ophthalmology;  Laterality: Left;  [id number]  . GASTRIC BYPASS    . PARS PLANA VITRECTOMY Left 03/17/2015   Procedure: PARS PLANA VITRECTOMY WITH 25 GAUGE;  Surgeon: JMilus Height MD;  Location: [facility] ORS;  Service: Ophthalmology;  Laterality: Left;  . SHOULDER ARTHROSCOPY    .  TONSILLECTOMY    . TUBAL LIGATION      SOCIAL HISTORY:   Social History   Tobacco Use  . Smoking status: Never Smoker  . Smokeless tobacco: Never Used  Substance Use Topics  . Alcohol use: No    FAMILY HISTORY:  No family history on file.  DRUG ALLERGIES:  No Known Allergies  REVIEW OF SYSTEMS:  CONSTITUTIONAL: No fever, fatigue or weakness.  EYES: No blurred or double vision.  EARS, NOSE, AND THROAT: No tinnitus or ear pain.  RESPIRATORY: No cough, shortness of breath, wheezing or hemoptysis.  CARDIOVASCULAR: No chest pain, orthopnea, edema.  GASTROINTESTINAL: No nausea, vomiting, reporting lower abdominal pain and profuse watery diarrhea with no blood  GENITOURINARY: No dysuria, hematuria.  ENDOCRINE: No polyuria, nocturia,  HEMATOLOGY: No anemia, easy bruising or bleeding SKIN: No rash or lesion. MUSCULOSKELETAL: No joint pain or arthritis.   NEUROLOGIC: No tingling, numbness, weakness.  PSYCHIATRY: No anxiety or depression.   MEDICATIONS AT HOME:   Prior to Admission medications   Medication Sig Start Date End Date Taking? Authorizing Provider  DULoxetine (CYMBALTA) 60 MG capsule Take 60 mg by mouth 2 (two) times daily.    Yes [provider]  gabapentin (NEURONTIN) 300 MG capsule Take 600 mg by mouth at bedtime.    Yes [provider]  glipiZIDE (GLUCOTROL XL) 5 MG 24 hr tablet Take 5 mg by mouth 2 (two) times daily.  Yes [provider]  insulin detemir (LEVEMIR) 100 UNIT/ML injection Inject 30 Units into the skin at bedtime.    Yes [provider]  levothyroxine (SYNTHROID, LEVOTHROID) 75 MCG tablet Take 75MCG by mouth daily Monday through Saturday   Yes [provider]  lisinopril (PRINIVIL,ZESTRIL) 10 MG tablet Take 10 mg by mouth daily.    Yes [provider]  simvastatin (ZOCOR) 40 MG tablet Take 40 mg by mouth at bedtime.    Yes [provider]  meloxicam (MOBIC) 7.5 MG tablet Take 1 tablet  (7.5 mg total) by mouth daily. Patient not taking: Reported on 02/24/2018 02/13/18   Pete Pelt, PA-C      VITAL SIGNS:  Blood pressure (!) 149/62, pulse 80, temperature (!) 96.6 F (35.9 C), temperature source Axillary, resp. rate 10, height '5\' 6"'  (1.676 m), weight 108.9 kg, SpO2 94 %.  PHYSICAL EXAMINATION:  GENERAL:  67 y.o.-year-old patient lying in the bed with no acute distress.  EYES: Pupils equal, round, reactive to light and accommodation. No scleral icterus. Extraocular muscles intact.  HEENT: Head atraumatic, normocephalic. Oropharynx and nasopharynx clear.  NECK:  Supple, no jugular venous distention. No thyroid enlargement, no tenderness.  LUNGS: Normal breath sounds bilaterally, no wheezing, rales,rhonchi or crepitation. No use of accessory muscles of respiration.  CARDIOVASCULAR: S1, S2 normal. No murmurs, rubs, or gallops.  ABDOMEN: Soft, lower abdomen is tender no rebound tenderness to, distended  Bowel sounds present.  EXTREMITIES: No pedal edema, cyanosis, or clubbing.  NEUROLOGIC: Cranial nerves II through XII are intact. Sensation intact. Gait not checked.  PSYCHIATRIC: The patient is alert and oriented x 3.  SKIN: No obvious rash, lesion, or ulcer.   LABORATORY PANEL:   CBC Recent Labs  Lab 02/24/18 1429  WBC 8.8  HGB 16.8*  HCT 48.7*  PLT 453*   ------------------------------------------------------------------------------------------------------------------  Chemistries  Recent Labs  Lab 02/24/18 1429  NA 136  K 4.2  CL 107  CO2 16*  GLUCOSE 457*  BUN 28*  CREATININE 1.67*  CALCIUM 9.4  AST 35  ALT 26  ALKPHOS 108  BILITOT 0.8   ------------------------------------------------------------------------------------------------------------------  Cardiac Enzymes Recent Labs  Lab 02/24/18 1429  TROPONINI <0.03    ------------------------------------------------------------------------------------------------------------------  RADIOLOGY:  Ct Abdomen Pelvis W Contrast  Result Date: 02/24/2018 CLINICAL DATA:  67 year old female with acute abdominal pain and diarrhea. EXAM: CT ABDOMEN AND PELVIS WITH CONTRAST TECHNIQUE: Multidetector CT imaging of the abdomen and pelvis was performed using the standard protocol following bolus administration of intravenous contrast. CONTRAST:  26m OMNIPAQUE IOHEXOL 300 MG/ML  SOLN COMPARISON:  03/25/2017 CT FINDINGS: Lower chest: No acute abnormality. Hepatobiliary: The liver is unremarkable. Cholelithiasis identified without CT evidence of acute cholecystitis. No biliary dilatation. Pancreas: Unremarkable Spleen: Unremarkable Adrenals/Urinary Tract: The kidneys, adrenal glands and bladder are unremarkable. Stomach/Bowel: Apparent mild circumferential wall thickening of the descending and proximal sigmoid colon noted and may represent colitis. Fluid within the remainder of the colon is noted likely representing a diarrheal state. There is no evidence of small bowel dilatation. The appendix is normal. Gastric bypass changes again identified. Vascular/Lymphatic: Aortic atherosclerosis. No enlarged abdominal or pelvic lymph nodes. Reproductive: Uterus and bilateral adnexa are unremarkable. Other: No ascites, abscess or pneumoperitoneum. Musculoskeletal: No acute or suspicious abnormality. Mild degenerative changes in the lumbar spine noted. IMPRESSION: 1. Apparent mild wall thickening of the descending and proximal sigmoid colon-question colitis. No bowel obstruction or pneumoperitoneum. Fluid within the remainder of the colon compatible with diarrheal state. 2.  Cholelithiasis 3. 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  Aortic Atherosclerosis (ICD10-I70.0). Electronically Signed   By: Margarette Canada M.D.   On: 02/24/2018 19:03    EKG:   Orders placed or  performed during the hospital encounter of 02/24/18  . ED EKG  . ED EKG  . EKG 12-Lead  . EKG 12-Lead    IMPRESSION AND PLAN:  Kaitlin Kelley  is a 67 y.o. female with a known history of colitis in the past, insulin requiring diabetes mellitus, hypertension, GERD, irritable bowel syndrome and  sleep apnea using CPAP is presenting to the ED with a chief complaint of acute abdominal pain in the lower abdominal area Patient feels bloated and was hyperglycemic with blood sugar around 460 Vomited 1 time in a.m. after that she is not vomiting.  CT abdomen has revealed colitis and patient is started on IV Cipro Floxin and Flagyl and hospitalist team is called to admit the patient.    #Sepsis secondary to acute colitis Patient met criteria at the time of admission with hypotension and elevated lactic acid Hydrate with IV fluids and provide IV antibiotics ciprofloxacin and Flagyl and monitor lactic acid levels  #Acute abdominal pain secondary to acute colitis Admit to MedSurg unit Pain management as needed and symptomatic treatment IV ciprofloxacin and Flagyl IV fluids Repeat a.m. Labs Stool for C. difficile toxin and GI panel are negative GI consult if no clinical improvement  #Acute kidney injury secondary to dehydration from profuse diarrhea Hydrate with IV fluids, monitor renal function and avoid nephrotoxins Renally adjust all medications  #Hypothyroidism continue Synthyroid  #Obstructive sleep apnea CPAP nightly  # diabetes mellitus insulin requiring with hyperglycemia Resume patient's Levemir 30 units nightly, titrate as needed Hydrate with IV fluids Sliding scale insulin Check hemoglobin A1c   #Hypertension  Initially patient was hypotensive.  Continue close monitoring and resume blood pressure medications blood pressure is stable  All the records are reviewed and case discussed with ED provider. Management plans discussed with the patient, family and they are in  agreement.  CODE STATUS: fc   TOTAL TIME TAKING CARE OF THIS PATIENT: 43 minutes.   Note: This dictation was prepared with Dragon dictation along with smaller phrase technology. Any transcriptional errors that result from this process are unintentional.  Nicholes Mango M.D on 02/24/2018 at 8:23 PM  Between 7am to 6pm - Pager - [phone]  After 6pm go to [email or web address] - password [email or web address] [facility]  Office  [phone]  CC: Primary care physician; Elmer Bales, MD

## 2018-02-24 NOTE — ED Notes (Signed)
Blood pressure normal x 2 on arrival, now 86/56 mmHg. MD made aware.

## 2018-02-24 NOTE — ED Triage Notes (Signed)
Pt brought in by [facility] for IBS sx. Pt was altered on EMS arrival. Pt was noted to be hypotensive by EMS (87/64). Pt normotensive when she arrived to ER. Pt diaphoretic and moaning.

## 2018-02-24 NOTE — ED Notes (Signed)
Large loose stool changed.

## 2018-02-24 NOTE — ED Notes (Signed)
Rectal tube placed with verbal MD order.

## 2018-02-24 NOTE — ED Notes (Signed)
Pt is A&Ox4 at this time. States she started having large loose stools this morning. Since she has been in ER, she has had 4 large watery stools. Stool is brown in color. No blood noted. Pt c/o severe abdominal pain and cramps.

## 2018-02-25 LAB — CBC
HCT: 39.9 % (ref 35.0–47.0)
Hemoglobin: 13.5 g/dL (ref 12.0–16.0)
MCH: 32.2 pg (ref 26.0–34.0)
MCHC: 33.8 g/dL (ref 32.0–36.0)
MCV: 95.2 fL (ref 80.0–100.0)
PLATELETS: 314 10*3/uL (ref 150–440)
RBC: 4.19 MIL/uL (ref 3.80–5.20)
RDW: 13.4 % (ref 11.5–14.5)
WBC: 13.3 10*3/uL — ABNORMAL HIGH (ref 3.6–11.0)

## 2018-02-25 LAB — COMPREHENSIVE METABOLIC PANEL
ALK PHOS: 92 U/L (ref 38–126)
ALT: 23 U/L (ref 0–44)
AST: 30 U/L (ref 15–41)
Albumin: 3.2 g/dL — ABNORMAL LOW (ref 3.5–5.0)
Anion gap: 8 (ref 5–15)
BUN: 31 mg/dL — AB (ref 8–23)
CALCIUM: 8.1 mg/dL — AB (ref 8.9–10.3)
CHLORIDE: 107 mmol/L (ref 98–111)
CO2: 20 mmol/L — AB (ref 22–32)
Creatinine, Ser: 1.54 mg/dL — ABNORMAL HIGH (ref 0.44–1.00)
GFR calc non Af Amer: 33 mL/min — ABNORMAL LOW (ref >60)
GFR, EST AFRICAN AMERICAN: 39 mL/min — AB (ref >60)
Glucose, Bld: 301 mg/dL — ABNORMAL HIGH (ref 70–99)
Potassium: 4.8 mmol/L (ref 3.5–5.1)
SODIUM: 135 mmol/L (ref 135–145)
Total Bilirubin: 0.6 mg/dL (ref 0.3–1.2)
Total Protein: 6 g/dL — ABNORMAL LOW (ref 6.5–8.1)

## 2018-02-25 LAB — HEMOGLOBIN A1C
Hgb A1c MFr Bld: 9.2 % — ABNORMAL HIGH (ref 4.8–5.6)
MEAN PLASMA GLUCOSE: 217.34 mg/dL

## 2018-02-25 LAB — GLUCOSE, CAPILLARY
GLUCOSE-CAPILLARY: 201 mg/dL — AB (ref 70–99)
GLUCOSE-CAPILLARY: 242 mg/dL — AB (ref 70–99)
Glucose-Capillary: 198 mg/dL — ABNORMAL HIGH (ref 70–99)
Glucose-Capillary: 187 mg/dL — ABNORMAL HIGH (ref 70–99)

## 2018-02-25 MED ORDER — ENOXAPARIN SODIUM 40 MG/0.4ML ~~LOC~~ SOLN
40.0000 mg | SUBCUTANEOUS | Status: DC
  Administered 2018-02-26: 40 mg via SUBCUTANEOUS
  Filled 2018-02-25 (×2): qty 0.4

## 2018-02-25 NOTE — Progress Notes (Signed)
[place] [facility] [place] NAME: Kaitlin Kelley    MR#:  [id number]  DATE OF BIRTH:  31-Dec-1950  SUBJECTIVE:   Patient came in with abdominal pain nausea and vomiting. Was found to have colitis.  Family in the room. Patient states she feels a lot better today. Tolerating full liquid diet. REVIEW OF SYSTEMS:   Review of Systems  Constitutional: Negative for chills, fever and weight loss.  HENT: Negative for ear discharge, ear pain and nosebleeds.   Eyes: Negative for blurred vision, pain and discharge.  Respiratory: Negative for sputum production, shortness of breath, wheezing and stridor.   Cardiovascular: Negative for chest pain, palpitations, orthopnea and PND.  Gastrointestinal: Positive for abdominal pain. Negative for diarrhea, nausea and vomiting.  Genitourinary: Negative for frequency and urgency.  Musculoskeletal: Negative for back pain and joint pain.  Neurological: Negative for sensory change, speech change, focal weakness and weakness.  Psychiatric/Behavioral: Negative for depression and hallucinations. The patient is not nervous/anxious.    Tolerating Diet:yes Tolerating PT: not needed  DRUG ALLERGIES:  No Known Allergies  VITALS:  Blood pressure (!) 119/53, pulse 80, temperature 97.9 F (36.6 C), temperature source Oral, resp. rate 18, height _0  (1.676 m), weight 109.3 kg, SpO2 99 %.  PHYSICAL EXAMINATION:   Physical Exam  GENERAL:  67 y.o.-year-old patient lying in the bed with no acute distress. obese EYES: Pupils equal, round, reactive to light and accommodation. No scleral icterus. Extraocular muscles intact.  HEENT: Head atraumatic, normocephalic. Oropharynx and nasopharynx clear.  NECK:  Supple, no jugular venous distention. No thyroid enlargement, no tenderness.  LUNGS: Normal breath sounds bilaterally, no wheezing, rales, rhonchi. No use of accessory muscles of respiration.  CARDIOVASCULAR: S1, S2 normal.  No murmurs, rubs, or gallops.  ABDOMEN: Soft, diffuse tenderness, nondistended. Bowel sounds present. No organomegaly or mass.  EXTREMITIES: No cyanosis, clubbing or edema b/l.    NEUROLOGIC: Cranial nerves II through XII are intact. No focal Motor or sensory deficits b/l.   PSYCHIATRIC:  patient is alert and oriented x 3.  SKIN: No obvious rash, lesion, or ulcer.   LABORATORY PANEL:  CBC Recent Labs  Lab 02/25/18 0522  WBC 13.3*  HGB 13.5  HCT 39.9  PLT 314    Chemistries  Recent Labs  Lab 02/25/18 0522  NA 135  K 4.8  CL 107  CO2 20*  GLUCOSE 301*  BUN 31*  CREATININE 1.54*  CALCIUM 8.1*  AST 30  ALT 23  ALKPHOS 92  BILITOT 0.6   Cardiac Enzymes Recent Labs  Lab 02/24/18 1429  TROPONINI <0.03   RADIOLOGY:  Ct Abdomen Pelvis W Contrast  Result Date: 02/24/2018 CLINICAL DATA:  67 year old female with acute abdominal pain and diarrhea. EXAM: CT ABDOMEN AND PELVIS WITH CONTRAST TECHNIQUE: Multidetector CT imaging of the abdomen and pelvis was performed using the standard protocol following bolus administration of intravenous contrast. CONTRAST:  20m OMNIPAQUE IOHEXOL 300 MG/ML  SOLN COMPARISON:  03/25/2017 CT FINDINGS: Lower chest: No acute abnormality. Hepatobiliary: The liver is unremarkable. Cholelithiasis identified without CT evidence of acute cholecystitis. No biliary dilatation. Pancreas: Unremarkable Spleen: Unremarkable Adrenals/Urinary Tract: The kidneys, adrenal glands and bladder are unremarkable. Stomach/Bowel: Apparent mild circumferential wall thickening of the descending and proximal sigmoid colon noted and may represent colitis. Fluid within the remainder of the colon is noted likely representing a diarrheal state. There is no evidence of small bowel dilatation. The appendix is normal. Gastric bypass changes again identified.  Vascular/Lymphatic: Aortic atherosclerosis. No enlarged abdominal or pelvic lymph nodes. Reproductive: Uterus and bilateral adnexa  are unremarkable. Other: No ascites, abscess or pneumoperitoneum. Musculoskeletal: No acute or suspicious abnormality. Mild degenerative changes in the lumbar spine noted. IMPRESSION: 1. Apparent mild wall thickening of the descending and proximal sigmoid colon-question colitis. No bowel obstruction or pneumoperitoneum. Fluid within the remainder of the colon compatible with diarrheal state. 2. Cholelithiasis 3. 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  Aortic Atherosclerosis (ICD10-I70.0). Electronically Signed   By: Margarette Canada M.D.   On: 02/24/2018 19:03   ASSESSMENT AND PLAN:  Kaitlin Kelley  is a 67 y.o. female with a known history of colitis in the past, insulin requiring diabetes mellitus, hypertension, GERD, irritable bowel syndrome and  sleep apnea using CPAP is presenting to the ED with a chief complaint of acute abdominal pain in the lower abdominal area.  #Sepsis secondary to acute colitis Patient met criteria at the time of admission with hypotension and elevated lactic acid (2.0--3.6) Hydrate with IV fluids and provide IV antibiotics ciprofloxacin and Flagyl   #Acute abdominal pain secondary to acute colitis Pain management as needed and symptomatic treatment IV ciprofloxacin and Flagyl IV fluids Stool for C. difficile toxin and GI panel are negative Pt feels better  #Acute kidney injury secondary to dehydration from profuse diarrhea Hydrate with IV fluids, monitor renal function and avoid nephrotoxins Renally adjust all medications Creat 1.67--1.54  #Hypothyroidism continue Synthyroid  #Obstructive sleep apnea CPAP nightly  # diabetes mellitus insulin requiring with hyperglycemia Resume patient's Levemir 30 units nightly, titrate as needed Hydrate with IV fluids Sliding scale insulin hemoglobin A1c 9.2 sugars much better  #Hypertension  BP soft today Case discussed with Care Management/Social Worker. Management plans discussed with the  patient, family and they are in agreement.  CODE STATUS: full  DVT Prophylaxis: lovenox  TOTAL TIME TAKING CARE OF THIS PATIENT: *30* minutes.  >50% time spent on counselling and coordination of care  POSSIBLE D/C IN *1-2* DAYS, DEPENDING ON CLINICAL CONDITION.  Note: This dictation was prepared with Dragon dictation along with smaller phrase technology. Any transcriptional errors that result from this process are unintentional.  Fritzi Mandes M.D on 02/25/2018 at 3:50 PM  Between 7am to 6pm - Pager - [phone]  After 6pm go to [email or web address] - password [facility]  Office  [phone]  CC: Primary care physician; Elmer Bales, MDPatient ID: Carmin Richmond, female   DOB: 1951/06/14, 68 y.o.   MRN: [id number]

## 2018-02-25 NOTE — Progress Notes (Signed)
PHARMACIST - PHYSICIAN COMMUNICATION  CONCERNING:  Enoxaparin (Lovenox) for DVT Prophylaxis    RECOMMENDATION: Patient was prescribed enoxaprin 30mg  q24 hours for VTE prophylaxis.   Filed Weights   02/24/18 1426 02/24/18 2305  Weight: 240 lb (108.9 kg) 240 lb 14.4 oz (109.3 kg)    Body mass index is 38.88 kg/m.  Estimated Creatinine Clearance: 43.2 mL/min (A) (by C-G formula based on SCr of 1.54 mg/dL (H)).   [facility] policy patient is candidate for enoxaparin 40mg  every 24 hours dosing due to CrCl > 30 ml/min   DESCRIPTION: Pharmacy has [facility]arin [facility], approved through P & T committee.  Patient is now receiving enoxaparin 40mg  every 24 hours.   Burnis Medinodney Atzin Buchta, PhamD Clinical Pharmacist  02/25/2018 9:51 AM

## 2018-02-25 NOTE — Progress Notes (Signed)
Order acknowledged for cpap. Patient seen. Patient states she has not worn her cpap in a couple of weeks. Patient has decided she does not want to wear cpap at this time. RN at bedside. Told patient to have RN to call RT if she changes her mind.

## 2018-02-26 LAB — GLUCOSE, CAPILLARY: GLUCOSE-CAPILLARY: 81 mg/dL (ref 70–99)

## 2018-02-26 LAB — HIV ANTIBODY (ROUTINE TESTING W REFLEX): HIV Screen 4th Generation wRfx: NONREACTIVE

## 2018-02-26 MED ORDER — METRONIDAZOLE 500 MG PO TABS
500.0000 mg | ORAL_TABLET | Freq: Three times a day (TID) | ORAL | Status: DC
  Administered 2018-02-26: 500 mg via ORAL
  Filled 2018-02-26 (×3): qty 1

## 2018-02-26 MED ORDER — TRAMADOL HCL 50 MG PO TABS: 50.0000 mg | ORAL_TABLET | Freq: Four times a day (QID) | ORAL | 0 refills | Status: DC | PRN

## 2018-02-26 MED ORDER — METRONIDAZOLE 500 MG PO TABS: 500.0000 mg | ORAL_TABLET | Freq: Three times a day (TID) | ORAL | 0 refills | Status: DC

## 2018-02-26 MED ORDER — CIPROFLOXACIN HCL 500 MG PO TABS
500.0000 mg | ORAL_TABLET | Freq: Two times a day (BID) | ORAL | Status: DC
  Administered 2018-02-26: 500 mg via ORAL
  Filled 2018-02-26: qty 1

## 2018-02-26 MED ORDER — CIPROFLOXACIN HCL 500 MG PO TABS: 500.0000 mg | ORAL_TABLET | Freq: Two times a day (BID) | ORAL | 0 refills | Status: DC

## 2018-02-26 NOTE — Progress Notes (Signed)
Pt given D/C instructions and was alerted to make follow up appt with her PCP. Her vitals were stable and her prescription sent to [facility] and her IV removed without incident. She was released to her husband.

## 2018-02-26 NOTE — Discharge Summary (Addendum)
[place] [facility] [place] NAME: Kaitlin Kelley    MR#:  [id number]  DATE OF BIRTH:  [birth year]  DATE OF ADMISSION:  02/24/2018 ADMITTING PHYSICIAN: Nicholes Mango, MD  DATE OF DISCHARGE: 02/26/2018  PRIMARY CARE PHYSICIAN: Elmer Bales, MD    ADMISSION DIAGNOSIS:  Colitis [K52.9]  DISCHARGE DIAGNOSIS:   Acute Colitis  SECONDARY DIAGNOSIS:   Past Medical History:  Diagnosis Date  . Arthritis   . Chronic cough   . Depression   . Diabetes mellitus without complication ([place])   . Edema    LEGS/FEET  . GERD (gastroesophageal reflux disease)   . HOH (hard of hearing)   . Hypertension   . Hypothyroidism   . IBS (irritable bowel syndrome)   . Sleep apnea    CPAP    HOSPITAL COURSE:   Kaitlin Kelley a83 y.o.femalewith a known history of colitis in the past, insulin requiring diabetes mellitus, hypertension, GERD, irritable bowel syndrome and sleep apnea using CPAP is presenting to the ED with a chief complaint of acute abdominal pain in the lower abdominal area.  #Sepsis secondary to acute colitis--now resolved Patient met criteria at the time of admission with hypotension, elevated wbc and elevated lactic acid (2.0--3.6) Hydrate with IV fluids and provide IV antibiotics ciprofloxacin and Flagyl --change to po abxs  #Acute abdominal pain secondary to acute colitis Pain management as needed and symptomatic treatment IV ciprofloxacin and Flagyl--change to oral abxs Stool for C. difficile toxin and GI panel are negative Pt feels better--soft diet  #Acute kidney injury secondary to dehydration from profuse diarrhea Hydrate with IV fluids, monitor renal function and avoid nephrotoxins Renally adjust all medications Creat 1.67--1.54  #Hypothyroidism continue Synthyroid  #Obstructive sleep apnea CPAP nightly  # diabetes mellitus insulin requiring with hyperglycemia Resume patient's Levemir 30 units  nightly,titrate as needed Sliding scale insulin hemoglobin A1c 9.2 sugars much better  #Hypertension  BP ok--resume home meds  Overall better D/c home Diet instructions given CONSULTS OBTAINED:    DRUG ALLERGIES:  No Known Allergies  DISCHARGE MEDICATIONS:   Allergies as of 02/26/2018   No Known Allergies     Medication List    STOP taking these medications   meloxicam 7.5 MG tablet Commonly known as:  MOBIC     TAKE these medications   ciprofloxacin 500 MG tablet Commonly known as:  CIPRO Take 1 tablet (500 mg total) by mouth 2 (two) times daily.   CYMBALTA 60 MG capsule Generic drug:  DULoxetine Take 60 mg by mouth 2 (two) times daily.   gabapentin 300 MG capsule Commonly known as:  NEURONTIN Take 600 mg by mouth at bedtime.   glipiZIDE 5 MG 24 hr tablet Commonly known as:  GLUCOTROL XL Take 5 mg by mouth 2 (two) times daily.   insulin detemir 100 UNIT/ML injection Commonly known as:  LEVEMIR Inject 30 Units into the skin at bedtime.   levothyroxine 75 MCG tablet Commonly known as:  SYNTHROID, LEVOTHROID Take 75MCG by mouth daily Monday through Saturday   lisinopril 10 MG tablet Commonly known as:  PRINIVIL,ZESTRIL Take 10 mg by mouth daily.   metroNIDAZOLE 500 MG tablet Commonly known as:  FLAGYL Take 1 tablet (500 mg total) by mouth 3 (three) times daily.   simvastatin 40 MG tablet Commonly known as:  ZOCOR Take 40 mg by mouth at bedtime.       If you experience worsening of your admission symptoms, develop shortness of breath, life  threatening emergency, suicidal or homicidal thoughts you must seek medical attention immediately by calling 911 or calling your MD immediately  if symptoms less severe.  You Must read complete instructions/literature along with all the possible adverse reactions/side effects for all the Medicines you take and that have been prescribed to you. Take any new Medicines after you have completely understood and accept  all the possible adverse reactions/side effects.   Please note  You were cared for by a hospitalist during your hospital stay. If you have any questions about your discharge medications or the care you received while you were in the hospital after you are discharged, you can call the unit and asked to speak with the hospitalist on call if the hospitalist that took care of you is not available. Once you are discharged, your primary care physician will handle any further medical issues. Please note that NO REFILLS for any discharge medications will be authorized once you are discharged, as it is imperative that you return to your primary care physician (or establish a relationship with a primary care physician if you do not have one) for your aftercare needs so that they can reassess your need for medications and monitor your lab values. Today   SUBJECTIVE   Doing well. Some gassy feeling in the belly  VITAL SIGNS:  Blood pressure (!) 131/59, pulse 70, temperature 98.4 F (36.9 C), temperature source Oral, resp. rate 20, height '5\' 6"'  (1.676 m), weight 109.3 kg, SpO2 95 %.  I/O:    Intake/Output Summary (Last 24 hours) at 02/26/2018 1218 Last data filed at 02/26/2018 0439 Gross per 24 hour  Intake 360 ml  Output 1800 ml  Net -1440 ml    PHYSICAL EXAMINATION:  GENERAL:  67 y.o.-year-old patient lying in the bed with no acute distress.  EYES: Pupils equal, round, reactive to light and accommodation. No scleral icterus. Extraocular muscles intact.  HEENT: Head atraumatic, normocephalic. Oropharynx and nasopharynx clear.  NECK:  Supple, no jugular venous distention. No thyroid enlargement, no tenderness.  LUNGS: Normal breath sounds bilaterally, no wheezing, rales,rhonchi or crepitation. No use of accessory muscles of respiration.  CARDIOVASCULAR: S1, S2 normal. No murmurs, rubs, or gallops.  ABDOMEN: Soft, non-tender, non-distended. Bowel sounds present. No organomegaly or mass.   EXTREMITIES: No pedal edema, cyanosis, or clubbing.  NEUROLOGIC: Cranial nerves II through XII are intact. Muscle strength 5/5 in all extremities. Sensation intact. Gait not checked.  PSYCHIATRIC: The patient is alert and oriented x 3.  SKIN: No obvious rash, lesion, or ulcer.   DATA REVIEW:   CBC  Recent Labs  Lab 02/25/18 0522  WBC 13.3*  HGB 13.5  HCT 39.9  PLT 314    Chemistries  Recent Labs  Lab 02/25/18 0522  NA 135  K 4.8  CL 107  CO2 20*  GLUCOSE 301*  BUN 31*  CREATININE 1.54*  CALCIUM 8.1*  AST 30  ALT 23  ALKPHOS 92  BILITOT 0.6    Microbiology Results   Recent Results (from the past 240 hour(s))  Gastrointestinal Panel by PCR , Stool     Status: None   Collection Time: 02/24/18  2:35 PM  Result Value Ref Range Status   Campylobacter species NOT DETECTED NOT DETECTED Final   Plesimonas shigelloides NOT DETECTED NOT DETECTED Final   Salmonella species NOT DETECTED NOT DETECTED Final   Yersinia enterocolitica NOT DETECTED NOT DETECTED Final   Vibrio species NOT DETECTED NOT DETECTED Final   Vibrio cholerae NOT DETECTED  NOT DETECTED Final   Enteroaggregative E coli (EAEC) NOT DETECTED NOT DETECTED Final   Enteropathogenic E coli (EPEC) NOT DETECTED NOT DETECTED Final   Enterotoxigenic E coli (ETEC) NOT DETECTED NOT DETECTED Final   Shiga like toxin producing E coli (STEC) NOT DETECTED NOT DETECTED Final   Shigella/Enteroinvasive E coli (EIEC) NOT DETECTED NOT DETECTED Final   Cryptosporidium NOT DETECTED NOT DETECTED Final   Cyclospora cayetanensis NOT DETECTED NOT DETECTED Final   Entamoeba histolytica NOT DETECTED NOT DETECTED Final   Giardia lamblia NOT DETECTED NOT DETECTED Final   Adenovirus F40/41 NOT DETECTED NOT DETECTED Final   Astrovirus NOT DETECTED NOT DETECTED Final   Norovirus GI/GII NOT DETECTED NOT DETECTED Final   Rotavirus A NOT DETECTED NOT DETECTED Final   Sapovirus (I, II, IV, and V) NOT DETECTED NOT DETECTED Final     Comment: Performed at [facility], [address] [place], [place] [address]  C difficile quick scan w PCR reflex     Status: None   Collection Time: 02/24/18  2:35 PM  Result Value Ref Range Status   C Diff antigen NEGATIVE NEGATIVE Final   C Diff toxin NEGATIVE NEGATIVE Final   C Diff interpretation No C. difficile detected.  Final    Comment: Performed at [facility], [address] [place], [place] [address]    RADIOLOGY:  Ct Abdomen Pelvis W Contrast  Result Date: 02/24/2018 CLINICAL DATA:  67 year old female with acute abdominal pain and diarrhea. EXAM: CT ABDOMEN AND PELVIS WITH CONTRAST TECHNIQUE: Multidetector CT imaging of the abdomen and pelvis was performed using the standard protocol following bolus administration of intravenous contrast. CONTRAST:  92m OMNIPAQUE IOHEXOL 300 MG/ML  SOLN COMPARISON:  03/25/2017 CT FINDINGS: Lower chest: No acute abnormality. Hepatobiliary: The liver is unremarkable. Cholelithiasis identified without CT evidence of acute cholecystitis. No biliary dilatation. Pancreas: Unremarkable Spleen: Unremarkable Adrenals/Urinary Tract: The kidneys, adrenal glands and bladder are unremarkable. Stomach/Bowel: Apparent mild circumferential wall thickening of the descending and proximal sigmoid colon noted and may represent colitis. Fluid within the remainder of the colon is noted likely representing a diarrheal state. There is no evidence of small bowel dilatation. The appendix is normal. Gastric bypass changes again identified. Vascular/Lymphatic: Aortic atherosclerosis. No enlarged abdominal or pelvic lymph nodes. Reproductive: Uterus and bilateral adnexa are unremarkable. Other: No ascites, abscess or pneumoperitoneum. Musculoskeletal: No acute or suspicious abnormality. Mild degenerative changes in the lumbar spine noted. IMPRESSION: 1. Apparent mild wall thickening of the descending and proximal sigmoid colon-question colitis. No bowel  obstruction or pneumoperitoneum. Fluid within the remainder of the colon compatible with diarrheal state. 2. Cholelithiasis 3. 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  Aortic Atherosclerosis (ICD10-I70.0). Electronically Signed   By: JMargarette CanadaM.D.   On: 02/24/2018 19:03     Management plans discussed with the patient, family and they are in agreement.  CODE STATUS:     Code Status Orders  (From admission, onward)         Start     Ordered   02/24/18 2255  Full code  Continuous     02/24/18 2254        Code Status History    This patient has a current code status but no historical code status.    Advance Directive Documentation     Most Recent Value  Type of Advance Directive  Healthcare Power of Attorney, Living will  Pre-existing out of facility DNR order (yellow form or pink  MOST form)  -  "MOST" Form in Place?  -      TOTAL TIME TAKING CARE OF THIS PATIENT: *40* minutes.    Fritzi Mandes M.D on 02/26/2018 at 12:18 PM  Between 7am to 6pm - Pager - [phone] After 6pm go to [email or web address] - password [email or web address]  Office  [phone]  CC: Primary care physician; Elmer Bales, MD

## 2018-03-11 ENCOUNTER — Telehealth: Payer: Self-pay | Admitting: Licensed Clinical Social Worker

## 2018-03-11 NOTE — Telephone Encounter (Signed)
CSW contacted patient this afternoon in response to an answer she gave on an EMMI call. She had expressed some feelings of sadness. CSW contacted patient and she stated she was doing much better and that she has a good support system in place. She has no concerns or worries at this time. York Spaniel MsW,LCSW [phone]

## 2019-08-22 ENCOUNTER — Ambulatory Visit: Payer: Medicare Other | Attending: Internal Medicine

## 2019-08-22 DIAGNOSIS — Z23 Encounter for immunization: Secondary | ICD-10-CM | POA: Insufficient documentation

## 2019-08-22 NOTE — Progress Notes (Signed)
   Covid-19 Vaccination Clinic  Name:  Kaitlin Kelley    MRN: [id number] DOB: 12-19-1950  08/22/2019  [name] was observed post Covid-19 immunization for 15 minutes without incidence. She was provided with Vaccine Information Sheet and instruction to access the V-Safe system.   [name] was instructed to call 911 with any severe reactions post vaccine: Marland Kitchen Difficulty breathing  . Swelling of your face and throat  . A fast heartbeat  . A bad rash all over your body  . Dizziness and weakness    Immunizations Administered    Name Date Dose VIS Date Route   Pfizer COVID-19 Vaccine 08/22/2019  9:53 AM 0.3 mL 06/20/2019 Intramuscular   Manufacturer: [employer], [employer]   Lot: [id number]   NDC: [address]-6

## 2019-09-13 ENCOUNTER — Ambulatory Visit: Payer: Medicare Other | Attending: Internal Medicine

## 2019-09-13 ENCOUNTER — Other Ambulatory Visit: Payer: Self-pay

## 2019-09-13 ENCOUNTER — Ambulatory Visit: Payer: Medicare Other

## 2019-09-13 DIAGNOSIS — Z23 Encounter for immunization: Secondary | ICD-10-CM

## 2019-09-13 NOTE — Progress Notes (Signed)
   Covid-19 Vaccination Clinic  Name:  Jasmarie Coppock    MRN: [id number] DOB: 09/08/1950  09/13/2019  Ms. Faso was observed post Covid-19 immunization for 15 minutes without incident. She was provided with Vaccine Information Sheet and instruction to access the V-Safe system.   Ms. Frederic was instructed to call 911 with any severe reactions post vaccine: Marland Kitchen Difficulty breathing  . Swelling of face and throat  . A fast heartbeat  . A bad rash all over body  . Dizziness and weakness   Immunizations Administered    Name Date Dose VIS Date Route   Pfizer COVID-19 Vaccine 09/13/2019  8:27 AM 0.3 mL 06/20/2019 Intramuscular   Manufacturer: [employer], [employer]   Lot: [id number]   NDC: [address]-5

## 2020-01-01 ENCOUNTER — Emergency Department: Payer: Medicare Other

## 2020-01-01 ENCOUNTER — Emergency Department
Admission: EM | Admit: 2020-01-01 | Discharge: 2020-01-02 | Disposition: A | Discharge status: Home / Self Care | Payer: Medicare Other | Attending: Student in an Organized Health Care Education/Training Program | Admitting: Student in an Organized Health Care Education/Training Program

## 2020-01-01 ENCOUNTER — Encounter: Payer: Self-pay | Admitting: Emergency Medicine

## 2020-01-01 ENCOUNTER — Other Ambulatory Visit: Payer: Self-pay

## 2020-01-01 DIAGNOSIS — E1122 Type 2 diabetes mellitus with diabetic chronic kidney disease: Secondary | ICD-10-CM | POA: Insufficient documentation

## 2020-01-01 DIAGNOSIS — N189 Chronic kidney disease, unspecified: Secondary | ICD-10-CM | POA: Diagnosis not present

## 2020-01-01 DIAGNOSIS — R202 Paresthesia of skin: Secondary | ICD-10-CM | POA: Diagnosis not present

## 2020-01-01 DIAGNOSIS — Z79899 Other long term (current) drug therapy: Secondary | ICD-10-CM | POA: Diagnosis not present

## 2020-01-01 DIAGNOSIS — R0789 Other chest pain: Secondary | ICD-10-CM

## 2020-01-01 DIAGNOSIS — E039 Hypothyroidism, unspecified: Secondary | ICD-10-CM | POA: Insufficient documentation

## 2020-01-01 DIAGNOSIS — I129 Hypertensive chronic kidney disease with stage 1 through stage 4 chronic kidney disease, or unspecified chronic kidney disease: Secondary | ICD-10-CM | POA: Diagnosis not present

## 2020-01-01 DIAGNOSIS — Z794 Long term (current) use of insulin: Secondary | ICD-10-CM | POA: Diagnosis not present

## 2020-01-01 LAB — CBC
HCT: 40.3 % (ref 36.0–46.0)
Hemoglobin: 13.8 g/dL (ref 12.0–15.0)
MCH: 33.2 pg (ref 26.0–34.0)
MCHC: 34.2 g/dL (ref 30.0–36.0)
MCV: 96.9 fL (ref 80.0–100.0)
Platelets: 363 10*3/uL (ref 150–400)
RBC: 4.16 MIL/uL (ref 3.87–5.11)
RDW: 13.1 % (ref 11.5–15.5)
WBC: 8.4 10*3/uL (ref 4.0–10.5)
nRBC: 0 % (ref 0.0–0.2)

## 2020-01-01 LAB — TROPONIN I (HIGH SENSITIVITY)
Troponin I (High Sensitivity): 24 ng/L — ABNORMAL HIGH (ref <18)
Troponin I (High Sensitivity): 24 ng/L — ABNORMAL HIGH (ref <18)
Troponin I (High Sensitivity): 26 ng/L — ABNORMAL HIGH (ref <18)
Troponin I (High Sensitivity): 27 ng/L — ABNORMAL HIGH (ref <18)

## 2020-01-01 LAB — BASIC METABOLIC PANEL
Anion gap: 12 (ref 5–15)
BUN: 24 mg/dL — ABNORMAL HIGH (ref 8–23)
CO2: 22 mmol/L (ref 22–32)
Calcium: 9.5 mg/dL (ref 8.9–10.3)
Chloride: 101 mmol/L (ref 98–111)
Creatinine, Ser: 1.16 mg/dL — ABNORMAL HIGH (ref 0.44–1.00)
GFR calc Af Amer: 55 mL/min — ABNORMAL LOW (ref >60)
GFR calc non Af Amer: 47 mL/min — ABNORMAL LOW (ref >60)
Glucose, Bld: 198 mg/dL — ABNORMAL HIGH (ref 70–99)
Potassium: 4 mmol/L (ref 3.5–5.1)
Sodium: 135 mmol/L (ref 135–145)

## 2020-01-01 MED ORDER — SODIUM CHLORIDE 0.9% FLUSH: 3.0000 mL | Freq: Once | INTRAVENOUS | Status: DC

## 2020-01-01 MED ORDER — IOHEXOL 350 MG/ML SOLN
75.0000 mL | Freq: Once | INTRAVENOUS | Status: AC | PRN
  Administered 2020-01-01: 75 mL via INTRAVENOUS

## 2020-01-01 NOTE — ED Notes (Signed)
Pt transported to MRI 

## 2020-01-01 NOTE — Discharge Instructions (Addendum)
Please seek medical attention for any high fevers, chest pain, shortness of breath, change in behavior, persistent vomiting, bloody stool or any other new or concerning symptoms.  

## 2020-01-01 NOTE — ED Notes (Signed)
Pt stating she does not want MRI performed since she is already discussing it with another doctor outside of [facility]. MD made aware.

## 2020-01-01 NOTE — ED Triage Notes (Signed)
First Nurse note:  Patient is complaining of left hand numbness/tingling and left rib numbness x 1 week.  Patient states, "I feel a knot on my ribs".

## 2020-01-01 NOTE — ED Triage Notes (Signed)
Pt presents to ED via POV with c/o knot to L ribs under her breast, states L arm numbness/tingling x 1 week, and chest tightness to the L side. Pt states numbness/tingling started approx 1 week ago to L middle finger and has slowly progressed.

## 2020-01-01 NOTE — ED Provider Notes (Signed)
MRI without acute abnormality. Cervical spine does show canal and foraminal stenosis. Discussed findings with patient. Offered to give neurosurgery follow up however she states she will have her outpatient doctor refer her. Patient cannot take steroids because of concern for diabetes. Offered gabapentin which she declined.    Phineas Semen, MD 01/01/20 [phone]

## 2020-01-01 NOTE — ED Provider Notes (Signed)
[facility] Emergency Department Provider Note    First MD Initiated Contact with Patient 01/01/20 1633     (approximate)  I have reviewed the triage vital signs and the nursing notes.   HISTORY  Chief Complaint Numbness and Chest Pain    HPI Kaitlin Kelley is a 69 y.o. female presents to the ER for evaluation of left chest wall pain just under her left breast with feeling bulging sensation there this morning.   States she has been having the same pain for roughly 1 week.  Denies any pressure in her chest no shortness of breath.  Is never noted this kind of swelling before.  Has a history of cardiac illness.  Does have known CKD denies any fevers or rashes.  Denies any pain radiating to her back or jaw.  States she has had over a month of some tingling in her fingers for which she is got an outpatient MRI scheduled.  Does feel like the tingling is worsening.  She denies any diaphoresis.  Denies any injury.  Is never had pain like this before.    Past Medical History:  Diagnosis Date  . Arthritis   . Chronic cough   . Depression   . Diabetes mellitus without complication (HCC)   . Edema    LEGS/FEET  . GERD (gastroesophageal reflux disease)   . HOH (hard of hearing)   . Hypertension   . Hypothyroidism   . IBS (irritable bowel syndrome)   . Sleep apnea    CPAP   No family history on file. Past Surgical History:  Procedure Laterality Date  . EYE SURGERY    . GAS INSERTION Left 03/17/2015   Procedure: INSERTION OF GAS;  Surgeon: Marcelene Butte, MD;  Location: [facility] ORS;  Service: Ophthalmology;  Laterality: Left;  [id number]  . GASTRIC BYPASS    . PARS PLANA VITRECTOMY Left 03/17/2015   Procedure: PARS PLANA VITRECTOMY WITH 25 GAUGE;  Surgeon: Marcelene Butte, MD;  Location: [facility] ORS;  Service: Ophthalmology;  Laterality: Left;  . SHOULDER ARTHROSCOPY    . TONSILLECTOMY    . TUBAL LIGATION     Patient Active Problem List   Diagnosis Date Noted  . AKI  (acute kidney injury) (HCC) 02/24/2018  . Peroneal tendonitis 11/19/2015  . Heel spur 10/05/2015  . Tendonitis, Achilles, right 10/05/2015  . Ankle sprain 10/05/2015      Prior to Admission medications   Medication Sig Start Date End Date Taking? Authorizing Provider  ciprofloxacin (CIPRO) 500 MG tablet Take 1 tablet (500 mg total) by mouth 2 (two) times daily. 02/26/18   Enedina Finner, MD  DULoxetine (CYMBALTA) 60 MG capsule Take 60 mg by mouth 2 (two) times daily.     [provider]  gabapentin (NEURONTIN) 300 MG capsule Take 600 mg by mouth at bedtime.     [provider]  glipiZIDE (GLUCOTROL XL) 5 MG 24 hr tablet Take 5 mg by mouth 2 (two) times daily.     [provider]  insulin detemir (LEVEMIR) 100 UNIT/ML injection Inject 30 Units into the skin at bedtime.     [provider]  levothyroxine (SYNTHROID, LEVOTHROID) 75 MCG tablet Take by mouth daily Monday through Saturday    [provider]  lisinopril (PRINIVIL,ZESTRIL) 10 MG tablet Take 10 mg by mouth daily.     [provider]  metroNIDAZOLE (FLAGYL) 500 MG tablet Take 1 tablet (500 mg total) by mouth 3 (three) times daily. 02/26/18  Enedina Finner, MD  simvastatin (ZOCOR) 40 MG tablet Take 40 mg by mouth at bedtime.     [provider]    Allergies Patient has no known allergies.    Social History Social History   Tobacco Use  . Smoking status: Never Smoker  . Smokeless tobacco: Never Used  Vaping Use  . Vaping Use: Never used  Substance Use Topics  . Alcohol use: No  . Drug use: No    Review of Systems Patient denies headaches, rhinorrhea, blurry vision, numbness, shortness of breath, chest pain, edema, cough, abdominal pain, nausea, vomiting, diarrhea, dysuria, fevers, rashes or hallucinations unless otherwise stated above in HPI. ____________________________________________   PHYSICAL EXAM:  VITAL SIGNS: Vitals:   01/01/20 1531 01/01/20  1800  BP: (!) 152/94 (!) 147/84  Pulse: 85 73  Resp: 17   Temp: 98.1 F (36.7 C)   SpO2: 98% 97%    Constitutional: Alert and oriented.  Eyes: Conjunctivae are normal.  Head: Atraumatic. Nose: No congestion/rhinnorhea. Mouth/Throat: Mucous membranes are moist.   Neck: No stridor. Painless ROM.  Cardiovascular: Normal rate, regular rhythm. Grossly normal heart sounds.  Good peripheral circulation.  There is tenderness palpation chest under the left breast.  Is reproducible.  No overlying lesions no cellulitis. Respiratory: Normal respiratory effort.  No retractions. Lungs CTAB. Gastrointestinal: Soft and nontender in all 4 quadrants. No distention. No abdominal bruits. No CVA tenderness. Genitourinary:  Musculoskeletal: No lower extremity tenderness nor edema.  No joint effusions. Neurologic:  Normal speech and language. No gross focal neurologic deficits are appreciated. No facial droop.  Does have subjective sensation to light touch decreased in her ulnar aspect of her left hand going up to the elbow.  Has good motor strength.  Skin:  Skin is warm, dry and intact. No rash noted. Psychiatric: Mood and affect are normal. Speech and behavior are normal.  ____________________________________________   LABS (all labs ordered are listed, but only abnormal results are displayed)  Results for orders placed or performed during the hospital encounter of 01/01/20 (from the past 24 hour(s))  Basic metabolic panel     Status: Abnormal   Collection Time: 01/01/20 12:17 PM  Result Value Ref Range   Sodium 135 135 - 145 mmol/L   Potassium 4.0 3.5 - 5.1 mmol/L   Chloride 101 98 - 111 mmol/L   CO2 22 22 - 32 mmol/L   Glucose, Bld 198 (H) 70 - 99 mg/dL   BUN 24 (H) 8 - 23 mg/dL   Creatinine, Ser 2.29 (H) 0.44 - 1.00 mg/dL   Calcium 9.5 8.9 - 79.8 mg/dL   GFR calc non Af Amer 47 (L) >60 mL/min   GFR calc Af Amer 55 (L) >60 mL/min   Anion gap 12 5 - 15  CBC     Status: None   Collection  Time: 01/01/20 12:17 PM  Result Value Ref Range   WBC 8.4 4.0 - 10.5 K/uL   RBC 4.16 3.87 - 5.11 MIL/uL   Hemoglobin 13.8 12.0 - 15.0 g/dL   HCT 92.1 36 - 46 %   MCV 96.9 80.0 - 100.0 fL   MCH 33.2 26.0 - 34.0 pg   MCHC 34.2 30.0 - 36.0 g/dL   RDW 19.4 17.4 - 08.1 %   Platelets 363 150 - 400 K/uL   nRBC 0.0 0.0 - 0.2 %  Troponin I (High Sensitivity)     Status: Abnormal   Collection Time: 01/01/20 12:17 PM  Result Value Ref Range  Troponin I (High Sensitivity) 24 (H) <18 ng/L  Troponin I (High Sensitivity)     Status: Abnormal   Collection Time: 01/01/20  3:31 PM  Result Value Ref Range   Troponin I (High Sensitivity) 24 (H) <18 ng/L  Troponin I (High Sensitivity)     Status: Abnormal   Collection Time: 01/01/20  7:21 PM  Result Value Ref Range   Troponin I (High Sensitivity) 26 (H) <18 ng/L   ____________________________________________  EKG My review and personal interpretation at Time: 12:17   Indication: chest pain  Rate: 95  Rhythm: sinus Axis: normal Other: normal intervals, no stemi or depression ____________________________________________  RADIOLOGY  I personally reviewed all radiographic images ordered to evaluate for the above acute complaints and reviewed radiology reports and findings.  These findings were personally discussed with the patient.  Please see medical record for radiology report.  ____________________________________________   PROCEDURES  Procedure(s) performed:  Procedures    Critical Care performed: no ____________________________________________   INITIAL IMPRESSION / ASSESSMENT AND PLAN / ED COURSE  Pertinent labs & imaging results that were available during my care of the patient were reviewed by me and considered in my medical decision making (see chart for details).   DDX: msk strain, abscess, mass, shingles, radiculopathy, cva, ACS, pericarditis,dissection, pna, bronchitis, costochondritis   Ailen Strauch is a 69 y.o. who  presents to the ED with symptoms as described above.  Pain seems to be very reproducible with palpation of the left anterior chest wall.  No appreciate any abdominal pain on exam.  Does have some pain with inspiration given location will order CT angiogram to evaluate for any evidence of PE mass or thoracic abnormality.  Her EKG is nonischemic her troponins are mildly elevated but in the setting of her CKD I suspect her chronic and stable as her pain has been ongoing to 1 week without typical features and a normal EKG.  Seems atypical and not consistent with ACS.  Heart score is 3 versus 4 based on subjectivity, however given her age and risk factors will order serial enzymes and observe.  The patient will be placed on continuous pulse oximetry and telemetry for monitoring.  Laboratory evaluation will be sent to evaluate for the above complaints.     Clinical Course as of Jan 01 2024  Thu Jan 01, 2020  1850 Patient now complaining of worsening tingling in her left hand and weakness in left upper extremity that she feels is her primary concern states that this is been progressing.  Discussed results of her CTA.  Her abdominal exam is soft and benign.  Do not feel this is an acute intra-abdominal process.  Will repeat troponin.  Will order MRI to evaluate for radiculopathy or acute cord impingement.  Patient was signed out to oncoming physician pending results of MRI to evaluate for acute CNS lesion if that is stable will be appropriate for outpatient follow-up.   [PR]    Clinical Course User Index [PR] Willy Eddy, MD    The patient was evaluated in Emergency Department today for the symptoms described in the history of present illness. He/she was evaluated in the context of the global COVID-19 pandemic, which necessitated consideration that the patient might be at risk for infection with the SARS-CoV-2 virus that causes COVID-19. Institutional protocols and algorithms that pertain to the evaluation  of patients at risk for COVID-19 are in a state of rapid change based on information released by regulatory bodies including the CDC and  federal and state organizations. These policies and algorithms were followed during the patient's care in the ED.  As part of my medical decision making, I reviewed the following data within the [place] notes reviewed and incorporated, Labs reviewed, notes from prior ED visits and [place] Controlled Substance Database   ____________________________________________   FINAL CLINICAL IMPRESSION(S) / ED DIAGNOSES  Final diagnoses:  Chest wall pain  Paresthesia of left arm      NEW MEDICATIONS STARTED DURING THIS VISIT:  New Prescriptions   No medications on file     Note:  This document was prepared using Dragon voice recognition software and may include unintentional dictation errors.    Merlyn Lot, MD 01/01/20 2025

## 2020-01-02 NOTE — ED Notes (Signed)
Pt comfortable in bed at this time, IV removed per request. Pt given ginger ale, no further needs at this time. Will continue to monitor.

## 2020-01-19 ENCOUNTER — Other Ambulatory Visit: Payer: Self-pay

## 2020-01-19 ENCOUNTER — Inpatient Hospital Stay: Payer: Medicare Other | Attending: Internal Medicine | Admitting: Internal Medicine

## 2020-01-19 ENCOUNTER — Encounter: Payer: Self-pay | Admitting: Internal Medicine

## 2020-01-19 ENCOUNTER — Inpatient Hospital Stay: Payer: Medicare Other

## 2020-01-19 VITALS — BP 146/73 | HR 89 | Temp 97.1°F | Resp 20 | Ht 66.0 in | Wt 218.0 lb

## 2020-01-19 DIAGNOSIS — K589 Irritable bowel syndrome without diarrhea: Secondary | ICD-10-CM | POA: Diagnosis not present

## 2020-01-19 DIAGNOSIS — K8020 Calculus of gallbladder without cholecystitis without obstruction: Secondary | ICD-10-CM | POA: Diagnosis not present

## 2020-01-19 DIAGNOSIS — E039 Hypothyroidism, unspecified: Secondary | ICD-10-CM | POA: Insufficient documentation

## 2020-01-19 DIAGNOSIS — M47812 Spondylosis without myelopathy or radiculopathy, cervical region: Secondary | ICD-10-CM | POA: Insufficient documentation

## 2020-01-19 DIAGNOSIS — G4730 Sleep apnea, unspecified: Secondary | ICD-10-CM | POA: Insufficient documentation

## 2020-01-19 DIAGNOSIS — E119 Type 2 diabetes mellitus without complications: Secondary | ICD-10-CM | POA: Diagnosis not present

## 2020-01-19 DIAGNOSIS — Z79899 Other long term (current) drug therapy: Secondary | ICD-10-CM | POA: Insufficient documentation

## 2020-01-19 DIAGNOSIS — Z794 Long term (current) use of insulin: Secondary | ICD-10-CM | POA: Diagnosis not present

## 2020-01-19 DIAGNOSIS — K219 Gastro-esophageal reflux disease without esophagitis: Secondary | ICD-10-CM | POA: Diagnosis not present

## 2020-01-19 DIAGNOSIS — I10 Essential (primary) hypertension: Secondary | ICD-10-CM | POA: Diagnosis not present

## 2020-01-19 DIAGNOSIS — M1990 Unspecified osteoarthritis, unspecified site: Secondary | ICD-10-CM | POA: Diagnosis not present

## 2020-01-19 DIAGNOSIS — R1012 Left upper quadrant pain: Secondary | ICD-10-CM

## 2020-01-19 DIAGNOSIS — F329 Major depressive disorder, single episode, unspecified: Secondary | ICD-10-CM | POA: Insufficient documentation

## 2020-01-19 DIAGNOSIS — R590 Localized enlarged lymph nodes: Secondary | ICD-10-CM

## 2020-01-19 DIAGNOSIS — I700 Atherosclerosis of aorta: Secondary | ICD-10-CM | POA: Insufficient documentation

## 2020-01-19 DIAGNOSIS — M2578 Osteophyte, vertebrae: Secondary | ICD-10-CM | POA: Insufficient documentation

## 2020-01-19 LAB — COMPREHENSIVE METABOLIC PANEL
ALT: 51 U/L — ABNORMAL HIGH (ref 0–44)
AST: 42 U/L — ABNORMAL HIGH (ref 15–41)
Albumin: 4.1 g/dL (ref 3.5–5.0)
Alkaline Phosphatase: 90 U/L (ref 38–126)
Anion gap: 12 (ref 5–15)
BUN: 16 mg/dL (ref 8–23)
CO2: 22 mmol/L (ref 22–32)
Calcium: 8.8 mg/dL — ABNORMAL LOW (ref 8.9–10.3)
Chloride: 102 mmol/L (ref 98–111)
Creatinine, Ser: 1.1 mg/dL — ABNORMAL HIGH (ref 0.44–1.00)
GFR calc Af Amer: 58 mL/min — ABNORMAL LOW (ref >60)
GFR calc non Af Amer: 50 mL/min — ABNORMAL LOW (ref >60)
Glucose, Bld: 194 mg/dL — ABNORMAL HIGH (ref 70–99)
Potassium: 3.9 mmol/L (ref 3.5–5.1)
Sodium: 136 mmol/L (ref 135–145)
Total Bilirubin: 0.5 mg/dL (ref 0.3–1.2)
Total Protein: 6.6 g/dL (ref 6.5–8.1)

## 2020-01-19 LAB — CBC WITH DIFFERENTIAL/PLATELET
Abs Immature Granulocytes: 0.02 10*3/uL (ref 0.00–0.07)
Basophils Absolute: 0.1 10*3/uL (ref 0.0–0.1)
Basophils Relative: 1 %
Eosinophils Absolute: 0.1 10*3/uL (ref 0.0–0.5)
Eosinophils Relative: 2 %
HCT: 40.2 % (ref 36.0–46.0)
Hemoglobin: 14.4 g/dL (ref 12.0–15.0)
Immature Granulocytes: 0 %
Lymphocytes Relative: 25 %
Lymphs Abs: 1.9 10*3/uL (ref 0.7–4.0)
MCH: 33.5 pg (ref 26.0–34.0)
MCHC: 35.8 g/dL (ref 30.0–36.0)
MCV: 93.5 fL (ref 80.0–100.0)
Monocytes Absolute: 0.6 10*3/uL (ref 0.1–1.0)
Monocytes Relative: 8 %
Neutro Abs: 4.9 10*3/uL (ref 1.7–7.7)
Neutrophils Relative %: 64 %
Platelets: 383 10*3/uL (ref 150–400)
RBC: 4.3 MIL/uL (ref 3.87–5.11)
RDW: 12.6 % (ref 11.5–15.5)
WBC: 7.7 10*3/uL (ref 4.0–10.5)
nRBC: 0 % (ref 0.0–0.2)

## 2020-01-19 LAB — LACTATE DEHYDROGENASE: LDH: 161 U/L (ref 98–192)

## 2020-01-19 LAB — C-REACTIVE PROTEIN: CRP: 0.6 mg/dL (ref <1.0)

## 2020-01-19 NOTE — Progress Notes (Signed)
[facility] CONSULT NOTE  Patient Care Team: Elissa Hefty, MD as PCP - General (Internal Medicine)  CHIEF COMPLAINTS/PURPOSE OF CONSULTATION: Mediastinal/hilar adenopathy  #  Oncology History   No history exists.     HISTORY OF PRESENTING ILLNESS:  Kaitlin Kelley 69 y.o.  female patient referred to [place] for further evaluation regarding incidental diagnosis of mediastinal/right hilar adenopathy noted on CT scan of the chest while in the emergency room.  Patient had episode of substernal chest pain which was further evaluated emergency room with a CT scan that showed-subcarinal 2.5 cm lymph nodes; paratracheal and right hilar without any obvious etiology in the CT chest.  Patient complains of a knot in the left upper quadrant for the last 2 months.  Feels left upper quadrant discomfort.  No nausea no vomiting.  Also complains of tingling and numbness of the upper extremities left more than right.  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  Review of Systems  Constitutional: Positive for malaise/fatigue. Negative for chills, diaphoresis, fever and weight loss.  HENT: Negative for nosebleeds and sore throat.   Eyes: Negative for double vision.  Respiratory: Negative for cough, hemoptysis, sputum production, shortness of breath and wheezing.   Cardiovascular: Positive for chest pain. Negative for palpitations, orthopnea and leg swelling.  Gastrointestinal: Positive for abdominal pain. Negative for blood in stool, constipation, diarrhea, heartburn, melena, nausea and vomiting.  Genitourinary: Negative for dysuria, frequency and urgency.  Musculoskeletal: Positive for back pain and joint pain.  Skin: Negative.  Negative for itching and rash.  Neurological: Positive for tingling. Negative for dizziness, focal weakness, weakness and headaches.  Endo/Heme/Allergies: Does not bruise/bleed easily.  Psychiatric/Behavioral: Negative for depression. The patient is  not nervous/anxious and does not have insomnia.      MEDICAL HISTORY:  Past Medical History:  Diagnosis Date  . Arthritis   . Chronic cough   . Depression   . Diabetes mellitus without complication (HCC)   . Edema    LEGS/FEET  . GERD (gastroesophageal reflux disease)   . HOH (hard of hearing)   . Hypertension   . Hypothyroidism   . IBS (irritable bowel syndrome)   . Sleep apnea    CPAP    SURGICAL HISTORY: Past Surgical History:  Procedure Laterality Date  . EYE SURGERY    . GAS INSERTION Left 03/17/2015   Procedure: INSERTION OF GAS;  Surgeon: Marcelene Butte, MD;  Location: [facility] ORS;  Service: Ophthalmology;  Laterality: Left;  [id number]  . GASTRIC BYPASS    . PARS PLANA VITRECTOMY Left 03/17/2015   Procedure: PARS PLANA VITRECTOMY WITH 25 GAUGE;  Surgeon: Marcelene Butte, MD;  Location: [facility] ORS;  Service: Ophthalmology;  Laterality: Left;  . SHOULDER ARTHROSCOPY    . TONSILLECTOMY    . TUBAL LIGATION      SOCIAL HISTORY: Social History   Socioeconomic History  . Marital status: Married    Spouse name: Not on file  . Number of children: Not on file  . Years of education: Not on file  . Highest education level: Not on file  Occupational History  . Not on file  Tobacco Use  . Smoking status: Never Smoker  . Smokeless tobacco: Never Used  Vaping Use  . Vaping Use: Never used  Substance and Sexual Activity  . Alcohol use: No  . Drug use: No  . Sexual activity: Not on file  Other Topics Concern  . Not on file  Social History Narrative  Lives in [place] with husband; 2 sons; never smoked; rare alcohol; customer service; retd. Used to live in [place].    Social Determinants of Health   Financial Resource Strain:   . Difficulty of Paying Living Expenses:   Food Insecurity:   . Worried About Programme researcher, broadcasting/film/video in the Last Year:   . Barista in the Last Year:   Transportation Needs:   . Freight forwarder (Medical):   Marland Kitchen Lack of  Transportation (Non-Medical):   Physical Activity:   . Days of Exercise per Week:   . Minutes of Exercise per Session:   Stress:   . Feeling of Stress :   Social Connections:   . Frequency of Communication with Friends and Family:   . Frequency of Social Gatherings with Friends and Family:   . Attends Religious Services:   . Active Member of Clubs or Organizations:   . Attends Banker Meetings:   Marland Kitchen Marital Status:   Intimate Partner Violence:   . Fear of Current or Ex-Partner:   . Emotionally Abused:   Marland Kitchen Physically Abused:   . Sexually Abused:     FAMILY HISTORY: Family History  Problem Relation Age of Onset  . Leukemia Brother        died at 78 years  . Prostate cancer Brother   . Breast cancer Paternal Aunt   . Brain cancer Cousin     ALLERGIES:  has No Known Allergies.  MEDICATIONS:  Current Outpatient Medications  Medication Sig Dispense Refill  . amLODipine (NORVASC) 5 MG tablet Take 1 tablet by mouth daily.    Marland Kitchen azelastine (ASTELIN) 0.1 % nasal spray Place 1 spray into both nostrils as needed for allergies.    . Bacillus Coagulans-Inulin (ALIGN PREBIOTIC-PROBIOTIC PO) Take 1 capsule by mouth daily.    . Cholecalciferol 50 MCG (2000 UT) CAPS Take 1 capsule by mouth daily.    . cyanocobalamin 1000 MCG tablet Take 500 mcg by mouth daily.    Marland Kitchen dicyclomine (BENTYL) 10 MG capsule Take 1 capsule by mouth 4 (four) times daily as needed for spasms.    . dorzolamide (TRUSOPT) 2 % ophthalmic solution Place 1 drop into the left eye in the morning and at bedtime.    . Dulaglutide (TRULICITY) 1.5 MG/0.5ML SOPN Inject 0.5 mLs into the skin once a week.    . DULoxetine (CYMBALTA) 60 MG capsule Take 60 mg by mouth 2 (two) times daily.     Marland Kitchen glipiZIDE (GLUCOTROL) 10 MG tablet Take 1 tablet by mouth in the morning and at bedtime.    . insulin detemir (LEVEMIR) 100 UNIT/ML injection Inject 30 Units into the skin at bedtime.     Marland Kitchen levothyroxine (SYNTHROID, LEVOTHROID) 75  MCG tablet Take by mouth daily Monday through Saturday    . mirtazapine (REMERON) 15 MG tablet Take 15 mg by mouth at bedtime.    . rosuvastatin (CRESTOR) 40 MG tablet Take 1 tablet by mouth daily.    . Turmeric 500 MG CAPS Take 1 capsule by mouth daily.     No current facility-administered medications for this visit.    PHYSICAL EXAMINATION: ECOG PERFORMANCE STATUS: 0 - Asymptomatic  Vitals:   01/19/20 1115  BP: (!) 146/73  Pulse: 89  Resp: 20  Temp: (!) 97.1 F (36.2 C)   Filed Weights   01/19/20 1115  Weight: 218 lb (98.9 kg)    Physical Exam HENT:     Head: Normocephalic and  atraumatic.     Mouth/Throat:     Pharynx: No oropharyngeal exudate.  Eyes:     Pupils: Pupils are equal, round, and reactive to light.  Cardiovascular:     Rate and Rhythm: Normal rate and regular rhythm.  Pulmonary:     Effort: No respiratory distress.     Breath sounds: No wheezing.  Abdominal:     General: Bowel sounds are normal. There is no distension.     Palpations: Abdomen is soft. There is no mass.     Tenderness: There is no abdominal tenderness. There is no guarding or rebound.  Musculoskeletal:        General: No tenderness. Normal range of motion.     Cervical back: Normal range of motion and neck supple.  Skin:    General: Skin is warm.  Neurological:     Mental Status: She is alert and oriented to person, place, and time.  Psychiatric:        Mood and Affect: Affect normal.      LABORATORY DATA:  I have reviewed the data as listed Lab Results  Component Value Date   WBC 7.7 01/19/2020   HGB 14.4 01/19/2020   HCT 40.2 01/19/2020   MCV 93.5 01/19/2020   PLT 383 01/19/2020   Recent Labs    01/01/20 1217 01/19/20 1215  NA 135 136  K 4.0 3.9  CL 101 102  CO2 22 22  GLUCOSE 198* 194*  BUN 24* 16  CREATININE 1.16* 1.10*  CALCIUM 9.5 8.8*  GFRNONAA 47* 50*  GFRAA 55* 58*  PROT  --  6.6  ALBUMIN  --  4.1  AST  --  42*  ALT  --  51*  ALKPHOS  --  90   BILITOT  --  0.5    RADIOGRAPHIC STUDIES: I have personally reviewed the radiological images as listed and agreed with the findings in the report. DG Chest 2 View  Result Date: 01/01/2020 CLINICAL DATA:  Chest tightness.  Left arm numbness and tingling. EXAM: CHEST - 2 VIEW COMPARISON:  12/04/2009 and CT chest 12/05/2009. FINDINGS: Trachea is midline. Heart size within normal limits. Thoracic aorta is calcified. Lungs are clear. No pleural fluid. Flowing anterior osteophytosis in the thoracic spine. IMPRESSION: 1. No acute findings. 2.  Aortic atherosclerosis (ICD10-I70.0). Electronically Signed   By: Leanna Battles M.D.   On: 01/01/2020 13:13   CT Angio Chest PE W and/or Wo Contrast  Result Date: 01/01/2020 CLINICAL DATA:  Pain under the left breast EXAM: CT ANGIOGRAPHY CHEST WITH CONTRAST TECHNIQUE: Multidetector CT imaging of the chest was performed using the standard protocol during bolus administration of intravenous contrast. Multiplanar CT image reconstructions and MIPs were obtained to evaluate the vascular anatomy. CONTRAST:  75mL OMNIPAQUE IOHEXOL 350 MG/ML SOLN COMPARISON:  Chest x-ray 01/01/2020, CT chest 12/05/2009 FINDINGS: Cardiovascular: Satisfactory opacification of the pulmonary arteries to the segmental level. No evidence of pulmonary embolism. Nonaneurysmal aorta. Mild aortic atherosclerosis. Borderline cardiac size. No pericardial effusion Mediastinum/Nodes: Midline trachea. No thyroid mass. Prominent prevascular lymph nodes. Enlarged right paratracheal nodes measuring up to 11 mm. Enlarged precarinal lymph node measuring 18 mm. Enlarged subcarinal node measuring 23 mm. Multiple enlarged right hilar nodes measuring up to 18 mm. Lungs/Pleura: Lungs are clear. No pleural effusion or pneumothorax. Upper Abdomen: Status post gastric bypass.  Gallstones. Musculoskeletal: No chest wall abnormality. No acute or significant osseous findings. Review of the MIP images confirms the above  findings. IMPRESSION: 1. Negative for acute  pulmonary embolus. 2. Clear lung fields. 3. Enlarged mediastinal and right hilar lymph nodes, nonspecific but could be secondary to infection, inflammation, metastatic or lymphoproliferative disease. 4. Gallstones. 5. Aortic atherosclerosis. Aortic Atherosclerosis (ICD10-I70.0). Electronically Signed   By: Jasmine Pang M.D.   On: 01/01/2020 18:38   MR BRAIN WO CONTRAST  Result Date: 01/01/2020 CLINICAL DATA:  Left-sided weakness and tingling. EXAM: MRI HEAD WITHOUT CONTRAST MRI CERVICAL SPINE WITHOUT CONTRAST TECHNIQUE: Multiplanar, multiecho pulse sequences of the brain and surrounding structures, and cervical spine, to include the craniocervical junction and cervicothoracic junction, were obtained without intravenous contrast. COMPARISON:  None. FINDINGS: MRI HEAD FINDINGS Brain: No acute infarct, acute hemorrhage or extra-axial collection. Normal white matter signal. Normal volume of CSF spaces. No chronic microhemorrhage. Normal midline structures. 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 Vascular: Normal flow voids. Skull and upper cervical spine: Normal marrow signal. Sinuses/Orbits: Negative. Other: None. MRI CERVICAL SPINE FINDINGS Alignment: Straightening of normal cervical lordosis. Grade 1 anterolisthesis at C2-3 in C3-4. Vertebrae: No fracture, evidence of discitis, or bone lesion. Cord: Normal signal and morphology. Posterior Fossa, vertebral arteries, paraspinal tissues: Negative. Disc levels: C1-2: Unremarkable. C2-3: Normal disc space and facet joints. There is no spinal canal stenosis. No neural foraminal stenosis. C3-4: Moderate facet hypertrophy and small disc bulge with bilateral uncovertebral hypertrophy. There is no spinal canal stenosis. Mild bilateral neural foraminal stenosis. C4-5: Severe right and moderate left facet hypertrophy. Disc bulge with bilateral uncovertebral hypertrophy. There is no spinal canal stenosis. Severe right and mild left  neural foraminal stenosis. C5-6: Intermediate sized disc osteophyte complex. Moderate spinal canal stenosis. Severe bilateral neural foraminal stenosis. C6-7: Intermediate sized disc bulge with endplate and uncovertebral spurring. Moderate spinal canal stenosis. Moderate left neural foraminal stenosis. C7-T1: Endplate spurring disc space narrowing. There is no spinal canal stenosis. No neural foraminal stenosis. There is disc space narrowing with small bulges at multiple upper thoracic levels. There is mild stenosis and T3-4. IMPRESSION: 1. 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 Otherwise normal aging brain. 2. Moderate C5-6 and C6-7 spinal canal stenosis. 3. Severe right C4-5 and bilateral C5-6 neural foraminal stenosis. 4. Mild upper thoracic degenerative disc disease with mild spinal canal stenosis at T3-4. Electronically Signed   By: Deatra Robinson M.D.   On: 01/01/2020 22:24   MR Cervical Spine Wo Contrast  Result Date: 01/01/2020 CLINICAL DATA:  Left-sided weakness and tingling. EXAM: MRI HEAD WITHOUT CONTRAST MRI CERVICAL SPINE WITHOUT CONTRAST TECHNIQUE: Multiplanar, multiecho pulse sequences of the brain and surrounding structures, and cervical spine, to include the craniocervical junction and cervicothoracic junction, were obtained without intravenous contrast. COMPARISON:  None. FINDINGS: MRI HEAD FINDINGS Brain: No acute infarct, acute hemorrhage or extra-axial collection. Normal white matter signal. Normal volume of CSF spaces. No chronic microhemorrhage. Normal midline structures. 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 Vascular: Normal flow voids. Skull and upper cervical spine: Normal marrow signal. Sinuses/Orbits: Negative. Other: None. MRI CERVICAL SPINE FINDINGS Alignment: Straightening of normal cervical lordosis. Grade 1 anterolisthesis at C2-3 in C3-4. Vertebrae: No fracture, evidence of discitis, or bone lesion. Cord: Normal signal and morphology. Posterior Fossa, vertebral arteries, paraspinal  tissues: Negative. Disc levels: C1-2: Unremarkable. C2-3: Normal disc space and facet joints. There is no spinal canal stenosis. No neural foraminal stenosis. C3-4: Moderate facet hypertrophy and small disc bulge with bilateral uncovertebral hypertrophy. There is no spinal canal stenosis. Mild bilateral neural foraminal stenosis. C4-5: Severe right and moderate left facet hypertrophy. Disc bulge with bilateral uncovertebral hypertrophy. There is no spinal canal  stenosis. Severe right and mild left neural foraminal stenosis. C5-6: Intermediate sized disc osteophyte complex. Moderate spinal canal stenosis. Severe bilateral neural foraminal stenosis. C6-7: Intermediate sized disc bulge with endplate and uncovertebral spurring. Moderate spinal canal stenosis. Moderate left neural foraminal stenosis. C7-T1: Endplate spurring disc space narrowing. There is no spinal canal stenosis. No neural foraminal stenosis. There is disc space narrowing with small bulges at multiple upper thoracic levels. There is mild stenosis and T3-4. IMPRESSION: 1. 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 Otherwise normal aging brain. 2. Moderate C5-6 and C6-7 spinal canal stenosis. 3. Severe right C4-5 and bilateral C5-6 neural foraminal stenosis. 4. Mild upper thoracic degenerative disc disease with mild spinal canal stenosis at T3-4. Electronically Signed   By: Deatra RobinsonKevin  Herman M.D.   On: 01/01/2020 22:24    ASSESSMENT & PLAN:   Mediastinal lymphadenopathy #Incidental [CT chest]-Enlarged mediastinal [subcarinal-23 mm] and right/paratracheal hilar lymph nodes.  Reviewed with the patient the potential causes including but not limited to infection, inflammation, metastatic or lymphoproliferative disease.  #Recommend CT scan abdomen pelvis for further evaluation of lymphadenopathy.  Also discussed the need for a possible PET scan/and a subsequent biopsy to confirm the diagnosis.  Based on imaging would recommend evaluation with pulmonary.   Thank  you for allowing me to participate in the care of your pleasant patient. Please do not hesitate to contact me with questions or concerns in the interim.  #Check CBC CMP; CRP; ACE levels.  # DISPOSITION: # labs today- ordered.  # CT A/P asap # Follow up TBD-Dr.B  All questions were answered. The patient knows to call the clinic with any problems, questions or concerns.    Earna CoderGovinda R Quantel Mcinturff, MD 01/20/2020 10:44 PM

## 2020-01-19 NOTE — Assessment & Plan Note (Addendum)
#  Incidental [CT chest]-Enlarged mediastinal [subcarinal-23 mm] and right/paratracheal hilar lymph nodes.  Reviewed with the patient the potential causes including but not limited to infection, inflammation, metastatic or lymphoproliferative disease.  #Recommend CT scan abdomen pelvis for further evaluation of lymphadenopathy.  Also discussed the need for a possible PET scan/and a subsequent biopsy to confirm the diagnosis.  Based on imaging would recommend evaluation with pulmonary.   Thank you for allowing me to participate in the care of your pleasant patient. Please do not hesitate to contact me with questions or concerns in the interim.  #Check CBC CMP; CRP; ACE levels.  # DISPOSITION: # labs today- ordered.  # CT A/P asap # Follow up TBD-Dr.B

## 2020-01-20 ENCOUNTER — Ambulatory Visit: Payer: Medicare Other | Admitting: Cardiovascular Disease

## 2020-01-20 ENCOUNTER — Encounter: Payer: Self-pay | Admitting: Cardiovascular Disease

## 2020-01-20 VITALS — BP 156/80 | HR 87 | Ht 66.0 in | Wt 220.2 lb

## 2020-01-20 DIAGNOSIS — R0789 Other chest pain: Secondary | ICD-10-CM

## 2020-01-20 DIAGNOSIS — E782 Mixed hyperlipidemia: Secondary | ICD-10-CM

## 2020-01-20 DIAGNOSIS — I700 Atherosclerosis of aorta: Secondary | ICD-10-CM

## 2020-01-20 DIAGNOSIS — E118 Type 2 diabetes mellitus with unspecified complications: Secondary | ICD-10-CM

## 2020-01-20 DIAGNOSIS — R590 Localized enlarged lymph nodes: Secondary | ICD-10-CM | POA: Diagnosis not present

## 2020-01-20 DIAGNOSIS — R200 Anesthesia of skin: Secondary | ICD-10-CM

## 2020-01-20 LAB — ANGIOTENSIN CONVERTING ENZYME: Angiotensin-Converting Enzyme: 44 U/L (ref 14–82)

## 2020-01-20 NOTE — Progress Notes (Signed)
Cardiology Office Note  Date:  01/20/2020   ID:  Kaitlin Kelley, DOB 12/29/1950, MRN [id number]  PCP:  Elissa Hefty, MD   Chief Complaint  Patient presents with  . New Patient (Initial Visit)    Pt is fatigue/ pt has chest discomfort leading into either right/ left side of chest and going into hear/ numbness in arms and hands. Meds verbally reviewed w/ pt.    HPI:  Kaitlin Kelley is a 69 year old woman with past medical history of Poorly controlled diabetes, hyperlipidemia Who presents by referral from the emergency room for evaluation of chest pain numbness  Recently seen in the emergency room for chest pain, numbness January 01, 2020 was evaluated Reported having numbness and chest pain Left chest wall pain underneath the left breast, feeling bulging sensation Symptoms for 1 week  Tingling in her fingers, Work-up performed including MRI  MRI showing, reviewed with her on today's visit 1. 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 Otherwise normal aging brain. 2. Moderate C5-6 and C6-7 spinal canal stenosis. 3. Severe right C4-5 and bilateral C5-6 neural foraminal stenosis. 4. Mild upper thoracic degenerative disc disease with mild spinal canal stenosis at T3-4.  CTA chest images pulled up and reviewed Aortic atherosclerosis No significant coronary calcification noted, reviewed with her  Chest pain still there, thinks it is lymphnodes Also pain right upper epigastric area, feels numb  Denies any chest pain on exertion, no significant shortness of breath No regular exercise program  Lab work reviewed HBA1C 9 in 2019 HBA1C  8 now Started on trulicity, feels her numbers will be better  Total chol 117, LDL 48 CR 1.24  EKG personally reviewed by myself on todays visit Normal sinus rhythm rate 87 bpm no significant ST-T wave changes  PMH:   has a past medical history of Arthritis, Chronic cough, Depression, Diabetes mellitus without complication (HCC), Edema, GERD  (gastroesophageal reflux disease), HOH (hard of hearing), Hypertension, Hypothyroidism, IBS (irritable bowel syndrome), and Sleep apnea.  PSH:    Past Surgical History:  Procedure Laterality Date  . EYE SURGERY    . GAS INSERTION Left 03/17/2015   Procedure: INSERTION OF GAS;  Surgeon: Marcelene Butte, MD;  Location: [facility] ORS;  Service: Ophthalmology;  Laterality: Left;  [id number]  . GASTRIC BYPASS    . PARS PLANA VITRECTOMY Left 03/17/2015   Procedure: PARS PLANA VITRECTOMY WITH 25 GAUGE;  Surgeon: Marcelene Butte, MD;  Location: [facility] ORS;  Service: Ophthalmology;  Laterality: Left;  . SHOULDER ARTHROSCOPY    . TONSILLECTOMY    . TUBAL LIGATION      Current Outpatient Medications  Medication Sig Dispense Refill  . amLODipine (NORVASC) 5 MG tablet Take 1 tablet by mouth daily.    Marland Kitchen azelastine (ASTELIN) 0.1 % nasal spray Place 1 spray into both nostrils as needed for allergies.    . Bacillus Coagulans-Inulin (ALIGN PREBIOTIC-PROBIOTIC PO) Take 1 capsule by mouth daily.    . Cholecalciferol 50 MCG (2000 UT) CAPS Take 1 capsule by mouth daily.    . cyanocobalamin 1000 MCG tablet Take 500 mcg by mouth daily.    Marland Kitchen dicyclomine (BENTYL) 10 MG capsule Take 1 capsule by mouth 4 (four) times daily as needed for spasms.    . dorzolamide (TRUSOPT) 2 % ophthalmic solution Place 1 drop into the left eye in the morning and at bedtime.    . Dulaglutide (TRULICITY) 1.5 MG/0.5ML SOPN Inject 0.5 mLs into the skin once a week.    . DULoxetine (CYMBALTA) 60  MG capsule Take 60 mg by mouth 2 (two) times daily.     Marland Kitchen glipiZIDE (GLUCOTROL) 10 MG tablet Take 1 tablet by mouth in the morning and at bedtime.    . insulin detemir (LEVEMIR) 100 UNIT/ML injection Inject 30 Units into the skin at bedtime.     Marland Kitchen levothyroxine (SYNTHROID, LEVOTHROID) 75 MCG tablet Take by mouth daily Monday through Saturday    . mirtazapine (REMERON) 15 MG tablet Take 15 mg by mouth at bedtime.    . rosuvastatin (CRESTOR) 40 MG tablet  Take 1 tablet by mouth daily.    . Turmeric 500 MG CAPS Take 1 capsule by mouth daily.     No current facility-administered medications for this visit.     Allergies:   Patient has no known allergies.   Social History:  The patient  reports that she has never smoked. She has never used smokeless tobacco. She reports that she does not drink alcohol and does not use drugs.   Family History:   family history includes Brain cancer in her cousin; Breast cancer in her paternal aunt; Leukemia in her brother; Prostate cancer in her brother.    Review of Systems: Review of Systems  Constitutional: Negative.   HENT: Negative.   Respiratory: Negative.   Cardiovascular: Positive for chest pain.  Gastrointestinal: Negative.   Musculoskeletal: Negative.   Neurological: Negative.        Left arm numbness  Psychiatric/Behavioral: Negative.   All other systems reviewed and are negative.    PHYSICAL EXAM: VS:  BP (!) 156/80 (BP Location: Right Arm, Patient Position: Sitting, Cuff Size: Normal)   Pulse 87   Ht 5\' 6"  (1.676 m)   Wt 220 lb 4 oz (99.9 kg)   SpO2 98%   BMI 35.55 kg/m  , BMI Body mass index is 35.55 kg/m. GEN: Well nourished, well developed, in no acute distress HEENT: normal Neck: no JVD, carotid bruits, or masses Cardiac: RRR; no murmurs, rubs, or gallops,no edema  Respiratory:  clear to auscultation bilaterally, normal work of breathing GI: soft, nontender, nondistended, + BS MS: no deformity or atrophy Skin: warm and dry, no rash Neuro:  Strength and sensation are intact Psych: euthymic mood, full affect  Recent Labs: 01/19/2020: ALT 51; BUN 16; Creatinine, Ser 1.10; Hemoglobin 14.4; Platelets 383; Potassium 3.9; Sodium 136    Lipid Panel No results found for: CHOL, HDL, LDLCALC, TRIG    Wt Readings from Last 3 Encounters:  01/20/20 220 lb 4 oz (99.9 kg)  01/19/20 218 lb (98.9 kg)  01/01/20 217 lb (98.4 kg)       ASSESSMENT AND PLAN:  Problem List Items  Addressed This Visit    Mediastinal lymphadenopathy    Other Visit Diagnoses    Aortic atherosclerosis (HCC)    -  Primary   Diabetes mellitus type 2 with complications (HCC)       Chest discomfort       Relevant Orders   EKG 12-Lead   Left arm numbness       Mixed hyperlipidemia         Chest pain Atypical in nature, numbness associated with symptoms Does not present with exertion, CT scan unrevealing EKG benign Left arm pain more neurologic, less likely cardiac Recent cardiac enzymes in the emergency room nontrending and very low No further cardiac work-up needed at this time  Aortic atherosclerosis Images pulled up and reviewed, mild aortic atherosclerosis in the arch, minimal in the descending aorta  Minimal coronary calcification noted Cholesterol at goal Stressed importance of aggressive diabetes control  Type 2 diabetes with complications, poorly controlled Prior A1c of 9 Stressed the importance of getting her numbers down, recently started on Trulicity, feels her numbers will improve Diet discussed, walking program  Hyperlipidemia Numbers at goal on statin, no changes made  Left arm numbness More consistent with radiculopathy Reported it started with numbness in the fingers, now with left arm numbness Scheduled to see neurology MRI reviewed with her showing stenoses  Disposition:   F/U  12 months   Total encounter time more than 45 minutes  Greater than 50% was spent in counseling and coordination of care with the patient    Signed, Dossie Arbour, M.D., Ph.D. [facility] [place], [place] [phone]

## 2020-01-20 NOTE — Patient Instructions (Signed)
Medication Instructions:  No changes  If you need a refill on your cardiac medications before your next appointment, please call your pharmacy.    Lab work: No new labs needed   If you have labs (blood work) drawn today and your tests are completely normal, you will receive your results only by: . MyChart Message (if you have MyChart) OR . A paper copy in the mail If you have any lab test that is abnormal or we need to change your treatment, we will call you to review the results.   Testing/Procedures: No new testing needed   Follow-Up: At [facility], you and your health needs are our priority.  As part of our continuing mission to provide you with exceptional heart care, we have created designated Provider Care Teams.  These Care Teams include your primary Cardiologist (physician) and Advanced Practice Providers (APPs -  Physician Assistants and Nurse Practitioners) who all work together to provide you with the care you need, when you need it.  . You will need a follow up appointment as needed  . Providers on your designated Care Team:   . Christopher Berge, NP . Ryan Dunn, PA-C . Jacquelyn Visser, PA-C  Any Other Special Instructions Will Be Listed Below (If Applicable).  COVID-19 Vaccine Information can be found at: [email or web address] For questions related to vaccine distribution or appointments, please email [email or web address] or call [phone].     

## 2020-01-23 ENCOUNTER — Ambulatory Visit
Admission: RE | Admit: 2020-01-23 | Discharge: 2020-01-23 | Disposition: A | Discharge status: Home / Self Care | Payer: Medicare Other | Source: Ambulatory Visit | Attending: Internal Medicine | Admitting: Internal Medicine

## 2020-01-23 ENCOUNTER — Other Ambulatory Visit: Payer: Self-pay

## 2020-01-23 DIAGNOSIS — R1012 Left upper quadrant pain: Secondary | ICD-10-CM | POA: Insufficient documentation

## 2020-01-23 DIAGNOSIS — R590 Localized enlarged lymph nodes: Secondary | ICD-10-CM | POA: Diagnosis not present

## 2020-01-23 MED ORDER — IOHEXOL 300 MG/ML  SOLN
100.0000 mL | Freq: Once | INTRAMUSCULAR | Status: AC | PRN
  Administered 2020-01-23: 100 mL via INTRAVENOUS

## 2020-01-29 ENCOUNTER — Telehealth: Payer: Self-pay | Admitting: Internal Medicine

## 2020-01-29 ENCOUNTER — Encounter: Payer: Self-pay | Admitting: Internal Medicine

## 2020-01-29 DIAGNOSIS — R590 Localized enlarged lymph nodes: Secondary | ICD-10-CM

## 2020-01-29 NOTE — Telephone Encounter (Signed)
On 7/22- I spoke to pt re: results of the CT scan-Ab/pelvis- no evidence of lymphadenopathy. Re: Chest adenopathy- recommend PET scan ASAP. Pt in agreement.  # Please make referral to Dr.Alsekerov- re: mediastinal LN.  # schedule PET ASAP.  # follow up with MD; in 1-2 days after PET scan. No labs.  Hyaley- please follow up. Thx  FYI- Dr.A

## 2020-01-30 NOTE — Telephone Encounter (Signed)
Referral to Dr. Karna Christmas faxed to [facility].

## 2020-01-30 NOTE — Addendum Note (Signed)
Addended by: Keitha Butte on: 01/30/2020 09:24 AM   Modules accepted: Orders

## 2020-02-10 ENCOUNTER — Ambulatory Visit
Admission: RE | Admit: 2020-02-10 | Discharge: 2020-02-10 | Disposition: A | Discharge status: Home / Self Care | Payer: Medicare Other | Source: Ambulatory Visit | Attending: Internal Medicine | Admitting: Internal Medicine

## 2020-02-10 ENCOUNTER — Other Ambulatory Visit: Payer: Self-pay

## 2020-02-10 DIAGNOSIS — R590 Localized enlarged lymph nodes: Secondary | ICD-10-CM | POA: Insufficient documentation

## 2020-02-10 LAB — GLUCOSE, CAPILLARY: Glucose-Capillary: 100 mg/dL — ABNORMAL HIGH (ref 70–99)

## 2020-02-10 MED ORDER — FLUDEOXYGLUCOSE F - 18 (FDG) INJECTION
11.4000 | Freq: Once | INTRAVENOUS | Status: AC | PRN
  Administered 2020-02-10: 11.909 via INTRAVENOUS

## 2020-02-11 ENCOUNTER — Encounter: Payer: Self-pay | Admitting: Internal Medicine

## 2020-02-11 ENCOUNTER — Inpatient Hospital Stay: Payer: Medicare Other | Attending: Internal Medicine | Admitting: Internal Medicine

## 2020-02-11 DIAGNOSIS — Z803 Family history of malignant neoplasm of breast: Secondary | ICD-10-CM | POA: Diagnosis not present

## 2020-02-11 DIAGNOSIS — Z806 Family history of leukemia: Secondary | ICD-10-CM | POA: Insufficient documentation

## 2020-02-11 DIAGNOSIS — Z8042 Family history of malignant neoplasm of prostate: Secondary | ICD-10-CM | POA: Diagnosis not present

## 2020-02-11 DIAGNOSIS — R0789 Other chest pain: Secondary | ICD-10-CM | POA: Diagnosis not present

## 2020-02-11 DIAGNOSIS — R590 Localized enlarged lymph nodes: Secondary | ICD-10-CM | POA: Insufficient documentation

## 2020-02-11 DIAGNOSIS — Z9884 Bariatric surgery status: Secondary | ICD-10-CM | POA: Diagnosis not present

## 2020-02-11 DIAGNOSIS — Z808 Family history of malignant neoplasm of other organs or systems: Secondary | ICD-10-CM | POA: Insufficient documentation

## 2020-02-11 NOTE — Assessment & Plan Note (Addendum)
#   Incidental [CT chest]-Enlarged mediastinal [subcarinal-23 mm] and right/paratracheal hilar lymph nodes. Aug 3rd 2021-16 to 18 mm/Suv ~20- right paratracheal/Subcarinal lymph node; along with hilar adenopathy; < 1cm-prevascularLN -concerning for malignant process like lymphoma.  #Discussed with patient family regarding other potential benign causes of abnormal lymphadenopathy.  Would recommend EBUS/biopsy.  Discussed with Dr.Aleskerov regarding biopsy.  We will make a referral.  #Left inframammary chest wall pain/discomfort-etiology is unclear no obvious anatomic reason noted on PET scan.  Question subtle involvement/infiltration of the thoracic nerves.  I left a message for patient's primary care physician Dr. Corine Shelter to discuss my concerns.  We will also forward my note to [facility].   I spoke at length with the patient's family [husband; and 2 sons over the phome]- regarding the patient's clinical status/plan of care.  Family agreement.   # DISPOSITION:PET scan/CT scan/ MRI report # referral to [facility]- re: Mediastinal LN Bx # Follow up appx 1 week post Bx-Dr.B Cc:

## 2020-02-11 NOTE — Progress Notes (Signed)
[facility] CONSULT NOTE  Patient Care Team: Elissa Hefty, MD as PCP - General (Internal Medicine) Earna Coder, MD as Consulting Physician (Internal Medicine) Glory Buff, RN as Registered Nurse Vida Rigger, MD as Consulting Physician (Pulmonary Disease) Edythe Lynn, MD as Referring Physician (Neurology)  CHIEF COMPLAINTS/PURPOSE OF CONSULTATION: Mediastinal/hilar adenopathy  # July 2021- LEFT CHEST WALL DISCOMFORT- ? Etiology; CT- subcarinal 2.5 cm lymph nodes; paratracheal and right hilar; AUG 3rd 2021- PET scan- 16 to 18 mm/Suv ~20- right paratracheal/ Prevascular/ Subcarinal lymph node; along with hilar adenopathy-concerning for malignant process like lymphoma.  #  Oncology History   No history exists.     HISTORY OF PRESENTING ILLNESS:  Kaitlin Kelley 69 y.o.  female patient is here for follow-up/review results of the PET scan.  In the interim patient has been followed up by neurology [facility]-currently awaiting further work-up EMG.  In the interim patient was also evaluated by cardiology, Dr.Gollan-no concerns for any cardiac process.  Patient continues to have constant pressure-like pain 7 on a scale of 10 for the last 2 months nonpleuritic.  Increases with Valsalva maneuver/straining during bowel movement.  Otherwise no bleeding factors.  Also complains of tingling and numbness of the left upper extremity.   Otherwise appetite is good.  No weight loss but no night sweats.  Review of Systems  Constitutional: Positive for malaise/fatigue. Negative for chills, diaphoresis, fever and weight loss.  HENT: Negative for nosebleeds and sore throat.   Eyes: Negative for double vision.  Respiratory: Negative for cough, hemoptysis, sputum production, shortness of breath and wheezing.   Cardiovascular: Positive for chest pain. Negative for palpitations, orthopnea and leg swelling.  Gastrointestinal: Positive for abdominal pain. Negative for blood in  stool, constipation, diarrhea, heartburn, melena, nausea and vomiting.  Genitourinary: Negative for dysuria, frequency and urgency.  Musculoskeletal: Positive for back pain and joint pain.  Skin: Negative.  Negative for itching and rash.  Neurological: Positive for tingling. Negative for dizziness, focal weakness, weakness and headaches.  Endo/Heme/Allergies: Does not bruise/bleed easily.  Psychiatric/Behavioral: Negative for depression. The patient is not nervous/anxious and does not have insomnia.      MEDICAL HISTORY:  Past Medical History:  Diagnosis Date  . Arthritis   . Chronic cough   . Depression   . Diabetes mellitus without complication (HCC)   . Edema    LEGS/FEET  . GERD (gastroesophageal reflux disease)   . HOH (hard of hearing)   . Hypertension   . Hypothyroidism   . IBS (irritable bowel syndrome)   . Sleep apnea    CPAP    SURGICAL HISTORY: Past Surgical History:  Procedure Laterality Date  . EYE SURGERY    . GAS INSERTION Left 03/17/2015   Procedure: INSERTION OF GAS;  Surgeon: Marcelene Butte, MD;  Location: [facility] ORS;  Service: Ophthalmology;  Laterality: Left;  [id number]  . GASTRIC BYPASS    . PARS PLANA VITRECTOMY Left 03/17/2015   Procedure: PARS PLANA VITRECTOMY WITH 25 GAUGE;  Surgeon: Marcelene Butte, MD;  Location: [facility] ORS;  Service: Ophthalmology;  Laterality: Left;  . SHOULDER ARTHROSCOPY    . TONSILLECTOMY    . TUBAL LIGATION      SOCIAL HISTORY: Social History   Socioeconomic History  . Marital status: Married    Spouse name: Not on file  . Number of children: Not on file  . Years of education: Not on file  . Highest education level: Not on file  Occupational History  . Not  on file  Tobacco Use  . Smoking status: Never Smoker  . Smokeless tobacco: Never Used  Vaping Use  . Vaping Use: Never used  Substance and Sexual Activity  . Alcohol use: No  . Drug use: No  . Sexual activity: Not on file  Other Topics Concern  . Not on file   Social History Narrative   Lives in [place] with husband; 2 sons; never smoked; rare alcohol; customer service; retd. Used to live in [place].    Social Determinants of Health   Financial Resource Strain:   . Difficulty of Paying Living Expenses:   Food Insecurity:   . Worried About Programme researcher, broadcasting/film/video in the Last Year:   . Barista in the Last Year:   Transportation Needs:   . Freight forwarder (Medical):   Marland Kitchen Lack of Transportation (Non-Medical):   Physical Activity:   . Days of Exercise per Week:   . Minutes of Exercise per Session:   Stress:   . Feeling of Stress :   Social Connections:   . Frequency of Communication with Friends and Family:   . Frequency of Social Gatherings with Friends and Family:   . Attends Religious Services:   . Active Member of Clubs or Organizations:   . Attends Banker Meetings:   Marland Kitchen Marital Status:   Intimate Partner Violence:   . Fear of Current or Ex-Partner:   . Emotionally Abused:   Marland Kitchen Physically Abused:   . Sexually Abused:     FAMILY HISTORY: Family History  Problem Relation Age of Onset  . Leukemia Brother        died at 43 years  . Prostate cancer Brother   . Breast cancer Paternal Aunt   . Brain cancer Cousin     ALLERGIES:  has No Known Allergies.  MEDICATIONS:  Current Outpatient Medications  Medication Sig Dispense Refill  . amLODipine (NORVASC) 5 MG tablet Take 1 tablet by mouth daily.    Marland Kitchen azelastine (ASTELIN) 0.1 % nasal spray Place 1 spray into both nostrils as needed for allergies.    . Bacillus Coagulans-Inulin (ALIGN PREBIOTIC-PROBIOTIC PO) Take 1 capsule by mouth daily.    . Cholecalciferol 50 MCG (2000 UT) CAPS Take 1 capsule by mouth daily.    . cyanocobalamin 1000 MCG tablet Take 500 mcg by mouth daily.    Marland Kitchen dicyclomine (BENTYL) 10 MG capsule Take 1 capsule by mouth 4 (four) times daily as needed for spasms.    . dorzolamide (TRUSOPT) 2 % ophthalmic solution Place 1 drop into  the left eye in the morning and at bedtime.    . Dulaglutide (TRULICITY) 1.5 MG/0.5ML SOPN Inject 0.5 mLs into the skin once a week.    . DULoxetine (CYMBALTA) 60 MG capsule Take 60 mg by mouth 2 (two) times daily.     Marland Kitchen glipiZIDE (GLUCOTROL) 10 MG tablet Take 1 tablet by mouth in the morning and at bedtime.    . insulin detemir (LEVEMIR) 100 UNIT/ML injection Inject 30 Units into the skin at bedtime.     Marland Kitchen levothyroxine (SYNTHROID, LEVOTHROID) 75 MCG tablet Take by mouth daily Monday through Saturday    . mirtazapine (REMERON) 15 MG tablet Take 15 mg by mouth at bedtime.    . rosuvastatin (CRESTOR) 40 MG tablet Take 1 tablet by mouth daily.    . Turmeric 500 MG CAPS Take 1 capsule by mouth daily.     No current facility-administered medications for  this visit.    PHYSICAL EXAMINATION: ECOG PERFORMANCE STATUS: 0 - Asymptomatic  Vitals:   02/11/20 1514  BP: (!) 141/79  Pulse: 98  Resp: 18  Temp: 98.7 F (37.1 C)  SpO2: 100%   Filed Weights   02/11/20 1514  Weight: 219 lb (99.3 kg)    Physical Exam HENT:     Head: Normocephalic and atraumatic.     Mouth/Throat:     Pharynx: No oropharyngeal exudate.  Eyes:     Pupils: Pupils are equal, round, and reactive to light.  Cardiovascular:     Rate and Rhythm: Normal rate and regular rhythm.  Pulmonary:     Effort: Pulmonary effort is normal. No respiratory distress.     Breath sounds: Normal breath sounds. No wheezing.  Abdominal:     General: Bowel sounds are normal. There is no distension.     Palpations: Abdomen is soft. There is no mass.     Tenderness: There is no abdominal tenderness. There is no guarding or rebound.  Musculoskeletal:        General: No tenderness. Normal range of motion.     Cervical back: Normal range of motion and neck supple.     Comments: Tenderness under the left breast on deep palpation.  No masses felt.  No skin rash noted.  Skin:    General: Skin is warm.  Neurological:     Mental  Status: She is alert and oriented to person, place, and time.  Psychiatric:        Mood and Affect: Affect normal.      LABORATORY DATA:  I have reviewed the data as listed Lab Results  Component Value Date   WBC 7.7 01/19/2020   HGB 14.4 01/19/2020   HCT 40.2 01/19/2020   MCV 93.5 01/19/2020   PLT 383 01/19/2020   Recent Labs    01/01/20 1217 01/19/20 1215  NA 135 136  K 4.0 3.9  CL 101 102  CO2 22 22  GLUCOSE 198* 194*  BUN 24* 16  CREATININE 1.16* 1.10*  CALCIUM 9.5 8.8*  GFRNONAA 47* 50*  GFRAA 55* 58*  PROT  --  6.6  ALBUMIN  --  4.1  AST  --  42*  ALT  --  51*  ALKPHOS  --  90  BILITOT  --  0.5    RADIOGRAPHIC STUDIES: I have personally reviewed the radiological images as listed and agreed with the findings in the report. CT Abdomen Pelvis W Contrast  Result Date: 01/23/2020 CLINICAL DATA:  Left-sided abdominal pain for 1 month. Lower abdominal discomfort and pressure. EXAM: CT ABDOMEN AND PELVIS WITH CONTRAST TECHNIQUE: Multidetector CT imaging of the abdomen and pelvis was performed using the standard protocol following bolus administration of intravenous contrast. CONTRAST:  OMNIPAQUE IOHEXOL 300 MG/ML  SOLN COMPARISON:  02/24/2018 FINDINGS: Lower Chest: Incomplete visualization of subcarinal mediastinal and right hilar lymphadenopathy, as better demonstrated on recent chest CTA of 01/01/2020. Hepatobiliary: No hepatic masses identified. Gallstones are seen, however there is no evidence of cholecystitis or biliary dilatation. Pancreas:  No mass or inflammatory changes. Spleen: Within normal limits in size and appearance. Adrenals/Urinary Tract: No masses identified. No evidence of ureteral calculi or hydronephrosis. Stomach/Bowel: Stable postop changes from previous gastric bypass surgery. Mild wall thickening of the distal esophagus is seen, suspicious for esophagitis. No evidence of bowel obstruction, other inflammatory process or abnormal fluid  collections. Normal appendix visualized. Diverticulosis is seen mainly involving the descending and sigmoid colon,  however there is no evidence of diverticulitis. Vascular/Lymphatic: No pathologically enlarged lymph nodes. No abdominal aortic aneurysm. Aortic atherosclerosis noted. Reproductive:  No mass or other significant abnormality. Other:  None. Musculoskeletal:  No suspicious bone lesions identified. IMPRESSION: Previous gastric bypass surgery. Mild wall thickening of distal esophagus, suspicious for esophagitis. Consider upper endoscopy for further evaluation. Colonic diverticulosis, without radiographic evidence of diverticulitis. Cholelithiasis. No radiographic evidence of cholecystitis. Persistent subcarinal mediastinal and right hilar lymphadenopathy, as better demonstrated on recent chest CTA of 01/01/2020. Aortic Atherosclerosis (ICD10-I70.0). Electronically Signed   By: Danae Orleans M.D.   On: 01/23/2020 14:57   NM PET Image Initial (PI) Skull Base To Thigh  Result Date: 02/11/2020 CLINICAL DATA:  Initial treatment strategy for mediastinal lymphadenopathy. EXAM: NUCLEAR MEDICINE PET SKULL BASE TO THIGH TECHNIQUE: 11.9 mCi F-18 FDG was injected intravenously. Full-ring PET imaging was performed from the skull base to thigh after the radiotracer. CT data was obtained and used for attenuation correction and anatomic localization. Fasting blood glucose: 100 mg/dl COMPARISON:  CT abdomen/pelvis dated 01/23/2020. CTA chest dated 01/01/2020. FINDINGS: Mediastinal blood pool activity: SUV max 2.3 Liver activity: SUV max 3.5 NECK: No hypermetabolic cervical lymphadenopathy. Incidental CT findings: none CHEST: Hypermetabolic thoracic lymphadenopathy, including: --7 mm short axis left IMA node (series 3/image 75), max SUV 8.2 --7 mm short axis left prevascular node (series 3/image 76), max SUV 7.2 --16 mm short axis right paratracheal node (series 3/image 80), max SUV 19.3 --19 mm short axis right hilar node  (series 3/image 83), max SUV 14.3 --18 mm short axis subcarinal node (series 3/image 87), max SUV 20.4 No suspicious pulmonary nodules. Incidental CT findings: Atherosclerotic calcifications of the aortic arch. ABDOMEN/PELVIS: 11 mm short axis periportal node (series 3/image 131), max SUV 4.4. Additional small upper abdominal/portacaval nodes, non FDG avid. No hypermetabolic retroperitoneal or pelvic lymphadenopathy. No abnormal hypermetabolism in the liver, spleen, pancreas, or adrenal glands. Reference splenic activity 2.7. Incidental CT findings: Status post gastric bypass. Cholelithiasis (series 3/image 133), without associated inflammatory changes. Atherosclerotic calcifications the abdominal aorta and branch vessels. SKELETON: No focal hypermetabolic activity to suggest skeletal metastasis. Incidental CT findings: Degenerative changes of the visualized thoracolumbar spine. IMPRESSION: Hypermetabolic thoracic lymphadenopathy, as described above, favoring lymphoma over nodal metastases (in the absence of known primary). Additional small periportal node, also suspicious for possible lymphoma. Spleen is within normal limits. Electronically Signed   By: Charline Bills M.D.   On: 02/11/2020 08:26    ASSESSMENT & PLAN:   Mediastinal lymphadenopathy # Incidental [CT chest]-Enlarged mediastinal [subcarinal-23 mm] and right/paratracheal hilar lymph nodes. Aug 3rd 2021-16 to 18 mm/Suv ~20- right paratracheal/Subcarinal lymph node; along with hilar adenopathy; < 1cm-prevascularLN -concerning for malignant process like lymphoma.  #Discussed with patient family regarding other potential benign causes of abnormal lymphadenopathy.  Would recommend EBUS/biopsy.  Discussed with Dr.Aleskerov regarding biopsy.  We will make a referral.  #Left inframammary chest wall pain/discomfort-etiology is unclear no obvious anatomic reason noted on PET scan.  Question subtle involvement/infiltration of the thoracic nerves.  I  left a message for patient's primary care physician Dr. Corine Shelter to discuss my concerns.  We will also forward my note to [facility].   I spoke at length with the patient's family [husband; and 2 sons over the phome]- regarding the patient's clinical status/plan of care.  Family agreement.   # DISPOSITION:PET scan/CT scan/ MRI report # referral to [facility]- re: Mediastinal LN Bx # Follow up appx 1 week post Bx-Dr.B Cc:   All questions  were answered. The patient knows to call the clinic with any problems, questions or concerns.    Earna CoderGovinda R Alcee Sipos, MD 02/11/2020 4:58 PM

## 2020-02-18 ENCOUNTER — Telehealth: Payer: Self-pay | Admitting: *Deleted

## 2020-02-18 NOTE — Telephone Encounter (Signed)
Received incoming fax from Dr. Delene Loll office re: patient: Kaitlin Kelley. Their office just received the referral was previously faxed to [phone] which is the number on file in epic. This goes to the different dept in Epic. The correct office number fax is [phone].

## 2020-02-25 ENCOUNTER — Other Ambulatory Visit: Payer: Self-pay | Admitting: Pulmonary Disease

## 2020-02-25 DIAGNOSIS — R590 Localized enlarged lymph nodes: Secondary | ICD-10-CM

## 2020-02-26 ENCOUNTER — Encounter
Admission: RE | Admit: 2020-02-26 | Discharge: 2020-02-26 | Disposition: A | Discharge status: Home / Self Care | Payer: Medicare Other | Source: Ambulatory Visit | Attending: Pulmonary Disease | Admitting: Pulmonary Disease

## 2020-02-26 ENCOUNTER — Other Ambulatory Visit: Payer: Self-pay

## 2020-02-26 HISTORY — DX: Headache, unspecified: R51.9

## 2020-02-26 NOTE — Pre-Procedure Instructions (Signed)
Antonieta Iba, MD  Physician  Cardiology  Progress Notes    Signed  Encounter Date:  01/20/2020          Signed      Expand All Collapse All  Show:Clear all [x] Manual[x] Template[x] Copied  Added by: [x] , MD  [] Hover for details Cardiology Office Note  Date:  01/20/2020   ID:  Kaitlin Kelley, DOB Oct 14, 1950, MRN 01/22/2020  PCP:  Riley Lam, MD                 Chief Complaint  Patient presents with  . New Patient (Initial Visit)    Pt is fatigue/ pt has chest discomfort leading into either right/ left side of chest and going into hear/ numbness in arms and hands. Meds verbally reviewed w/ pt.    HPI:  Kaitlin Kelley is a 69 year old woman with past medical history of Poorly controlled diabetes, hyperlipidemia Who presents by referral from the emergency room for evaluation of chest pain numbness  Recently seen in the emergency room for chest pain, numbness January 01, 2020 was evaluated Reported having numbness and chest pain Left chest wall pain underneath the left breast, feeling bulging sensation Symptoms for 1 week  Tingling in her fingers, Work-up performed including MRI  MRI showing, reviewed with her on today's visit 1. 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 Otherwise normal aging brain. 2. Moderate C5-6 and C6-7 spinal canal stenosis. 3. Severe right C4-5 and bilateral C5-6 neural foraminal stenosis. 4. Mild upper thoracic degenerative disc disease with mild spinal canal stenosis at T3-4.  CTA chest images pulled up and reviewed Aortic atherosclerosis No significant coronary calcification noted, reviewed with her  Chest pain still there, thinks it is lymphnodes Also pain right upper epigastric area, feels numb  Denies any chest pain on exertion, no significant shortness of breath No regular exercise program  Lab work reviewed HBA1C 9 in 2019 HBA1C  8 now Started on trulicity, feels her numbers will be  better  Total chol 117, LDL 48 CR 1.24  EKG personally reviewed by myself on todays visit Normal sinus rhythm rate 87 bpm no significant ST-T wave changes  PMH:   has a past medical history of Arthritis, Chronic cough, Depression, Diabetes mellitus without complication (HCC), Edema, GERD (gastroesophageal reflux disease), HOH (hard of hearing), Hypertension, Hypothyroidism, IBS (irritable bowel syndrome), and Sleep apnea.  PSH:         Past Surgical History:  Procedure Laterality Date  . EYE SURGERY    . GAS INSERTION Left 03/17/2015   Procedure: INSERTION OF GAS;  Surgeon: January 03, 2020, MD;  Location: [facility] ORS;  Service: Ophthalmology;  Laterality: Left;  [id number]  . GASTRIC BYPASS    . PARS PLANA VITRECTOMY Left 03/17/2015   Procedure: PARS PLANA VITRECTOMY WITH 25 GAUGE;  Surgeon: Marcelene Butte, MD;  Location: [facility] ORS;  Service: Ophthalmology;  Laterality: Left;  . SHOULDER ARTHROSCOPY    . TONSILLECTOMY    . TUBAL LIGATION            Current Outpatient Medications  Medication Sig Dispense Refill  . amLODipine (NORVASC) 5 MG tablet Take 1 tablet by mouth daily.    1f azelastine (ASTELIN) 0.1 % nasal spray Place 1 spray into both nostrils as needed for allergies.    . Bacillus Coagulans-Inulin (ALIGN PREBIOTIC-PROBIOTIC PO) Take 1 capsule by mouth daily.    . Cholecalciferol 50 MCG (2000 UT) CAPS Take 1 capsule by mouth daily.    . cyanocobalamin  1000 MCG tablet Take 500 mcg by mouth daily.    Marland Kitchen dicyclomine (BENTYL) 10 MG capsule Take 1 capsule by mouth 4 (four) times daily as needed for spasms.    . dorzolamide (TRUSOPT) 2 % ophthalmic solution Place 1 drop into the left eye in the morning and at bedtime.    . Dulaglutide (TRULICITY) 1.5 MG/0.5ML SOPN Inject 0.5 mLs into the skin once a week.    . DULoxetine (CYMBALTA) 60 MG capsule Take 60 mg by mouth 2 (two) times daily.     Marland Kitchen glipiZIDE (GLUCOTROL) 10 MG tablet Take 1 tablet by mouth in the  morning and at bedtime.    . insulin detemir (LEVEMIR) 100 UNIT/ML injection Inject 30 Units into the skin at bedtime.     Marland Kitchen levothyroxine (SYNTHROID, LEVOTHROID) 75 MCG tablet Take by mouth daily Monday through Saturday    . mirtazapine (REMERON) 15 MG tablet Take 15 mg by mouth at bedtime.    . rosuvastatin (CRESTOR) 40 MG tablet Take 1 tablet by mouth daily.    . Turmeric 500 MG CAPS Take 1 capsule by mouth daily.     No current facility-administered medications for this visit.     Allergies:   Patient has no known allergies.   Social History:  The patient  reports that she has never smoked. She has never used smokeless tobacco. She reports that she does not drink alcohol and does not use drugs.   Family History:   family history includes Brain cancer in her cousin; Breast cancer in her paternal aunt; Leukemia in her brother; Prostate cancer in her brother.    Review of Systems: Review of Systems  Constitutional: Negative.   HENT: Negative.   Respiratory: Negative.   Cardiovascular: Positive for chest pain.  Gastrointestinal: Negative.   Musculoskeletal: Negative.   Neurological: Negative.        Left arm numbness  Psychiatric/Behavioral: Negative.   All other systems reviewed and are negative.    PHYSICAL EXAM: VS:  BP (!) 156/80 (BP Location: Right Arm, Patient Position: Sitting, Cuff Size: Normal)   Pulse 87   Ht 5\' 6"  (1.676 m)   Wt 220 lb 4 oz (99.9 kg)   SpO2 98%   BMI 35.55 kg/m  , BMI Body mass index is 35.55 kg/m. GEN: Well nourished, well developed, in no acute distress HEENT: normal Neck: no JVD, carotid bruits, or masses Cardiac: RRR; no murmurs, rubs, or gallops,no edema  Respiratory:  clear to auscultation bilaterally, normal work of breathing GI: soft, nontender, nondistended, + BS MS: no deformity or atrophy Skin: warm and dry, no rash Neuro:  Strength and sensation are intact Psych: euthymic mood, full  affect  Recent Labs: 01/19/2020: ALT 51; BUN 16; Creatinine, Ser 1.10; Hemoglobin 14.4; Platelets 383; Potassium 3.9; Sodium 136    Lipid Panel Recent Labs  No results found for: CHOL, HDL, LDLCALC, TRIG         Wt Readings from Last 3 Encounters:  01/20/20 220 lb 4 oz (99.9 kg)  01/19/20 218 lb (98.9 kg)  01/01/20 217 lb (98.4 kg)       ASSESSMENT AND PLAN:      Problem List Items Addressed This Visit        Mediastinal lymphadenopathy           Other Visit Diagnoses    Aortic atherosclerosis (HCC)    -  Primary   Diabetes mellitus type 2 with complications (HCC)  Chest discomfort       Relevant Orders   EKG 12-Lead   Left arm numbness       Mixed hyperlipidemia         Chest pain Atypical in nature, numbness associated with symptoms Does not present with exertion, CT scan unrevealing EKG benign Left arm pain more neurologic, less likely cardiac Recent cardiac enzymes in the emergency room nontrending and very low No further cardiac work-up needed at this time  Aortic atherosclerosis Images pulled up and reviewed, mild aortic atherosclerosis in the arch, minimal in the descending aorta Minimal coronary calcification noted Cholesterol at goal Stressed importance of aggressive diabetes control  Type 2 diabetes with complications, poorly controlled Prior A1c of 9 Stressed the importance of getting her numbers down, recently started on Trulicity, feels her numbers will improve Diet discussed, walking program  Hyperlipidemia Numbers at goal on statin, no changes made  Left arm numbness More consistent with radiculopathy Reported it started with numbness in the fingers, now with left arm numbness Scheduled to see neurology MRI reviewed with her showing stenoses  Disposition:   F/U  12 months   Total encounter time more than 45 minutes  Greater than 50% was spent in counseling and coordination of care with the  patient    Signed, Dossie Arbour, M.D., Ph.D. [facility] [place], [place] [phone]        Electronically signed by Antonieta Iba, MD at 01/20/2020 1:26 PM  Office Visit on 01/20/2020   Office Visit on 01/20/2020     Detailed Report    Note viewed by patient

## 2020-02-26 NOTE — Patient Instructions (Addendum)
Your procedure is scheduled on: 03-05-20 FRIDAY Report to Same Day Surgery 2nd floor medical mall [facility] Entrance-take elevator on left to 2nd floor.  Check in with surgery information desk.) To find out your arrival time please call [phone] between 1PM - 3PM on 03-04-20 THURSDAY  Remember: Instructions that are not followed completely may result in serious medical risk, up to and including death, or upon the discretion of your surgeon and anesthesiologist your surgery may need to be rescheduled.    _x___ 1. Do not eat food after midnight the night before your procedure. NO GUM OR CANDY AFTER MIDNIGHT. You may drink WATER up to 2 hours before you are scheduled to arrive at the hospital for your procedure.  Do not drink WATER within 2 hours of your scheduled arrival to the hospital.  Type 1 and type 2 diabetics should only drink water (YOU MAY DRINK APPLE JUICE IF YOUR BLOOD SUGAR DROPS)    __x__ 2. No Alcohol for 24 hours before or after surgery.   __x__3. No Smoking or e-cigarettes for 24 prior to surgery.  Do not use any chewable tobacco products for at least 6 hour prior to surgery   ____  4. Bring all medications with you on the day of surgery if instructed.    __x__ 5. Notify your doctor if there is any change in your medical condition     (cold, fever, infections).    x___6. On the morning of surgery brush your teeth with toothpaste and water.  You may rinse your mouth with mouth wash if you wish.  Do not swallow any toothpaste or mouthwash.   Do not wear jewelry, make-up, hairpins, clips or nail polish.  Do not wear lotions, powders, or perfumes. You may wear deodorant.  Do not shave 48 hours prior to surgery. Men may shave face and neck.  Do not bring valuables to the hospital.    [facility] is not responsible for any belongings or valuables.               Contacts, dentures or bridgework may not be worn into surgery.  Leave your suitcase in the car. After surgery  it may be brought to your room.  For patients admitted to the hospital, discharge time is determined by your treatment team.  _  Patients discharged the day of surgery will not be allowed to drive home.  You will need someone to drive you home and stay with you the night of your procedure.    Please read over the following fact sheets that you were given:   [facility] Preparing for Surgery   _x___ TAKE THE FOLLOWING MEDICATION THE MORNING OF SURGERY WITH A SMALL SIP OF WATER. These include:  1. GABAPENTIN (NEURONTIN)  2. SYNTHROID (LEVOTHYROXINE)  3. CYMBALTA (DULOXETINE)  4.  5.  6.  ____Fleets enema or Magnesium Citrate as directed.   ____ Use CHG Soap or sage wipes as directed on instruction sheet   ____ Use inhalers on the day of surgery and bring to hospital day of surgery  ____ Stop Metformin and Janumet 2 days prior to surgery.    _X___ Take 1/2 of usual insulin dose the night before surgery and none on the morning surgery-TAKE HALF OF YOUR SOLIQUA THURSDAY NIGHT (15 UNITS)  ____ Follow recommendations from Cardiologist, Pulmonologist or PCP regarding stopping Aspirin, Coumadin, Plavix ,Eliquis, Effient, or Pradaxa, and Pletal.  X____Stop Anti-inflammatories such as Advil, Aleve, Ibuprofen, Motrin, Naproxen, Naprosyn, Goodies powders  or aspirin products NOW-OK to take Tylenol    _x___ Stop supplements until after surgery-STOP YOUR TURMERIC AND AIRBORNE NOW-YOU MAY RESUME AFTER SURGERY   _X___ Bring C-Pap to the hospital.

## 2020-03-02 ENCOUNTER — Other Ambulatory Visit: Payer: Self-pay

## 2020-03-02 ENCOUNTER — Ambulatory Visit
Admission: RE | Admit: 2020-03-02 | Discharge: 2020-03-02 | Disposition: A | Discharge status: Home / Self Care | Payer: Medicare Other | Source: Ambulatory Visit | Attending: Pulmonary Disease | Admitting: Pulmonary Disease

## 2020-03-02 DIAGNOSIS — R590 Localized enlarged lymph nodes: Secondary | ICD-10-CM | POA: Diagnosis not present

## 2020-03-03 ENCOUNTER — Other Ambulatory Visit
Admission: RE | Admit: 2020-03-03 | Discharge: 2020-03-03 | Disposition: A | Discharge status: Home / Self Care | Payer: Medicare Other | Source: Ambulatory Visit | Attending: Pulmonary Disease | Admitting: Pulmonary Disease

## 2020-03-03 DIAGNOSIS — Z01812 Encounter for preprocedural laboratory examination: Secondary | ICD-10-CM | POA: Insufficient documentation

## 2020-03-03 DIAGNOSIS — Z20822 Contact with and (suspected) exposure to covid-19: Secondary | ICD-10-CM | POA: Insufficient documentation

## 2020-03-03 LAB — SARS CORONAVIRUS 2 (TAT 6-24 HRS): SARS Coronavirus 2: NEGATIVE

## 2020-03-03 LAB — PROTIME-INR
INR: 1 (ref 0.8–1.2)
Prothrombin Time: 12.9 seconds (ref 11.4–15.2)

## 2020-03-03 LAB — CBC
HCT: 36.1 % (ref 36.0–46.0)
Hemoglobin: 12 g/dL (ref 12.0–15.0)
MCH: 33.5 pg (ref 26.0–34.0)
MCHC: 33.2 g/dL (ref 30.0–36.0)
MCV: 100.8 fL — ABNORMAL HIGH (ref 80.0–100.0)
Platelets: 406 10*3/uL — ABNORMAL HIGH (ref 150–400)
RBC: 3.58 MIL/uL — ABNORMAL LOW (ref 3.87–5.11)
RDW: 12.8 % (ref 11.5–15.5)
WBC: 6 10*3/uL (ref 4.0–10.5)
nRBC: 0 % (ref 0.0–0.2)

## 2020-03-03 LAB — APTT: aPTT: 34 seconds (ref 24–36)

## 2020-03-05 ENCOUNTER — Ambulatory Visit: Payer: Medicare Other

## 2020-03-05 ENCOUNTER — Ambulatory Visit
Admission: RE | Admit: 2020-03-05 | Discharge: 2020-03-05 | Disposition: A | Discharge status: Home / Self Care | Payer: Medicare Other | Attending: Pulmonary Disease | Admitting: Pulmonary Disease

## 2020-03-05 ENCOUNTER — Ambulatory Visit: Payer: Medicare Other | Admitting: Registered Nurse

## 2020-03-05 ENCOUNTER — Encounter
Admission: RE | Disposition: A | Discharge status: Home / Self Care | Payer: Self-pay | Source: Home / Self Care | Attending: Pulmonary Disease

## 2020-03-05 ENCOUNTER — Encounter: Payer: Self-pay | Admitting: *Deleted

## 2020-03-05 ENCOUNTER — Other Ambulatory Visit: Payer: Self-pay

## 2020-03-05 DIAGNOSIS — R590 Localized enlarged lymph nodes: Secondary | ICD-10-CM | POA: Diagnosis not present

## 2020-03-05 DIAGNOSIS — Z9884 Bariatric surgery status: Secondary | ICD-10-CM | POA: Insufficient documentation

## 2020-03-05 DIAGNOSIS — Z79890 Hormone replacement therapy (postmenopausal): Secondary | ICD-10-CM | POA: Insufficient documentation

## 2020-03-05 DIAGNOSIS — E119 Type 2 diabetes mellitus without complications: Secondary | ICD-10-CM | POA: Diagnosis not present

## 2020-03-05 DIAGNOSIS — E039 Hypothyroidism, unspecified: Secondary | ICD-10-CM | POA: Insufficient documentation

## 2020-03-05 DIAGNOSIS — M1990 Unspecified osteoarthritis, unspecified site: Secondary | ICD-10-CM | POA: Diagnosis not present

## 2020-03-05 DIAGNOSIS — I10 Essential (primary) hypertension: Secondary | ICD-10-CM | POA: Diagnosis not present

## 2020-03-05 DIAGNOSIS — G4730 Sleep apnea, unspecified: Secondary | ICD-10-CM | POA: Insufficient documentation

## 2020-03-05 DIAGNOSIS — Z79899 Other long term (current) drug therapy: Secondary | ICD-10-CM | POA: Diagnosis not present

## 2020-03-05 DIAGNOSIS — Z794 Long term (current) use of insulin: Secondary | ICD-10-CM | POA: Diagnosis not present

## 2020-03-05 DIAGNOSIS — K589 Irritable bowel syndrome without diarrhea: Secondary | ICD-10-CM | POA: Insufficient documentation

## 2020-03-05 DIAGNOSIS — Z419 Encounter for procedure for purposes other than remedying health state, unspecified: Secondary | ICD-10-CM

## 2020-03-05 DIAGNOSIS — K219 Gastro-esophageal reflux disease without esophagitis: Secondary | ICD-10-CM | POA: Insufficient documentation

## 2020-03-05 HISTORY — PX: VIDEO BRONCHOSCOPY WITH ENDOBRONCHIAL NAVIGATION: SHX6175

## 2020-03-05 LAB — KOH PREP: KOH Prep: NONE SEEN

## 2020-03-05 LAB — GLUCOSE, CAPILLARY
Glucose-Capillary: 197 mg/dL — ABNORMAL HIGH (ref 70–99)
Glucose-Capillary: 161 mg/dL — ABNORMAL HIGH (ref 70–99)

## 2020-03-05 SURGERY — VIDEO BRONCHOSCOPY WITH ENDOBRONCHIAL NAVIGATION
Anesthesia: General

## 2020-03-05 MED ORDER — PROPOFOL 10 MG/ML IV BOLUS
INTRAVENOUS | Status: DC | PRN
  Administered 2020-03-05: 150 mg via INTRAVENOUS
  Administered 2020-03-05: 30 mg via INTRAVENOUS

## 2020-03-05 MED ORDER — FENTANYL CITRATE (PF) 100 MCG/2ML IJ SOLN
INTRAMUSCULAR | Status: DC | PRN
Start: 2020-03-05 — End: 2020-03-05
  Administered 2020-03-05 (×3): 25 ug via INTRAVENOUS

## 2020-03-05 MED ORDER — ONDANSETRON HCL 4 MG/2ML IJ SOLN
INTRAMUSCULAR | Status: DC | PRN
  Administered 2020-03-05: 4 mg via INTRAVENOUS

## 2020-03-05 MED ORDER — BUTAMBEN-TETRACAINE-BENZOCAINE 2-2-14 % EX AERO
1.0000 | INHALATION_SPRAY | Freq: Once | CUTANEOUS | Status: DC
  Filled 2020-03-05: qty 20

## 2020-03-05 MED ORDER — GLYCOPYRROLATE 0.2 MG/ML IJ SOLN
INTRAMUSCULAR | Status: DC | PRN
  Administered 2020-03-05 (×2): .1 mg via INTRAVENOUS

## 2020-03-05 MED ORDER — DEXMEDETOMIDINE HCL 200 MCG/2ML IV SOLN
INTRAVENOUS | Status: DC | PRN
  Administered 2020-03-05: 8 ug via INTRAVENOUS
  Administered 2020-03-05: 4 ug via INTRAVENOUS
  Administered 2020-03-05: 8 ug via INTRAVENOUS

## 2020-03-05 MED ORDER — ONDANSETRON HCL 4 MG/2ML IJ SOLN
INTRAMUSCULAR | Status: AC
  Filled 2020-03-05: qty 2

## 2020-03-05 MED ORDER — EPHEDRINE SULFATE 50 MG/ML IJ SOLN
INTRAMUSCULAR | Status: DC | PRN
  Administered 2020-03-05 (×4): 5 mg via INTRAVENOUS
  Administered 2020-03-05: 10 mg via INTRAVENOUS

## 2020-03-05 MED ORDER — KETOROLAC TROMETHAMINE 30 MG/ML IJ SOLN
INTRAMUSCULAR | Status: AC
  Filled 2020-03-05: qty 1

## 2020-03-05 MED ORDER — LIDOCAINE HCL URETHRAL/MUCOSAL 2 % EX GEL
1.0000 "application " | Freq: Once | CUTANEOUS | Status: DC
  Filled 2020-03-05: qty 5

## 2020-03-05 MED ORDER — DEXAMETHASONE SODIUM PHOSPHATE 10 MG/ML IJ SOLN
INTRAMUSCULAR | Status: AC
  Filled 2020-03-05: qty 1

## 2020-03-05 MED ORDER — LIDOCAINE HCL (PF) 1 % IJ SOLN
30.0000 mL | Freq: Once | INTRAMUSCULAR | Status: DC
  Filled 2020-03-05: qty 30

## 2020-03-05 MED ORDER — DEXMEDETOMIDINE HCL IN NACL 80 MCG/20ML IV SOLN
INTRAVENOUS | Status: AC
  Filled 2020-03-05: qty 20

## 2020-03-05 MED ORDER — GLYCOPYRROLATE 0.2 MG/ML IJ SOLN
INTRAMUSCULAR | Status: AC
  Filled 2020-03-05: qty 1

## 2020-03-05 MED ORDER — PHENYLEPHRINE HCL (PRESSORS) 10 MG/ML IV SOLN
INTRAVENOUS | Status: DC | PRN
  Administered 2020-03-05 (×4): 100 ug via INTRAVENOUS

## 2020-03-05 MED ORDER — SUGAMMADEX SODIUM 200 MG/2ML IV SOLN
INTRAVENOUS | Status: DC | PRN
  Administered 2020-03-05: 200 mg via INTRAVENOUS

## 2020-03-05 MED ORDER — ROCURONIUM BROMIDE 10 MG/ML (PF) SYRINGE
PREFILLED_SYRINGE | INTRAVENOUS | Status: AC
  Filled 2020-03-05: qty 10

## 2020-03-05 MED ORDER — FENTANYL CITRATE (PF) 100 MCG/2ML IJ SOLN: 25.0000 ug | INTRAMUSCULAR | Status: DC | PRN

## 2020-03-05 MED ORDER — DEXAMETHASONE SODIUM PHOSPHATE 10 MG/ML IJ SOLN
INTRAMUSCULAR | Status: DC | PRN
  Administered 2020-03-05: 10 mg via INTRAVENOUS

## 2020-03-05 MED ORDER — EPHEDRINE 5 MG/ML INJ
INTRAVENOUS | Status: AC
  Filled 2020-03-05: qty 10

## 2020-03-05 MED ORDER — SODIUM CHLORIDE 0.9 % IV SOLN: INTRAVENOUS | Status: DC | PRN

## 2020-03-05 MED ORDER — KETAMINE HCL 10 MG/ML IJ SOLN
INTRAMUSCULAR | Status: DC | PRN
  Administered 2020-03-05: 30 mg via INTRAVENOUS

## 2020-03-05 MED ORDER — ONDANSETRON HCL 4 MG/2ML IJ SOLN: 4.0000 mg | Freq: Once | INTRAMUSCULAR | Status: DC | PRN

## 2020-03-05 MED ORDER — ROCURONIUM BROMIDE 100 MG/10ML IV SOLN
INTRAVENOUS | Status: DC | PRN
  Administered 2020-03-05: 20 mg via INTRAVENOUS
  Administered 2020-03-05: 50 mg via INTRAVENOUS

## 2020-03-05 MED ORDER — PHENYLEPHRINE HCL 0.25 % NA SOLN
1.0000 | Freq: Four times a day (QID) | NASAL | Status: DC | PRN
  Filled 2020-03-05: qty 15

## 2020-03-05 MED ORDER — LIDOCAINE HCL (CARDIAC) PF 100 MG/5ML IV SOSY
PREFILLED_SYRINGE | INTRAVENOUS | Status: DC | PRN
  Administered 2020-03-05: 100 mg via INTRAVENOUS

## 2020-03-05 MED ORDER — FENTANYL CITRATE (PF) 100 MCG/2ML IJ SOLN
INTRAMUSCULAR | Status: AC
  Filled 2020-03-05: qty 2

## 2020-03-05 NOTE — Discharge Instructions (Signed)

## 2020-03-05 NOTE — Anesthesia Postprocedure Evaluation (Signed)
Anesthesia Post Note  Patient: Kaitlin Kelley  Procedure(s) Performed: VIDEO BRONCHOSCOPY WITH ENDOBRONCHIAL NAVIGATION (N/A )  Patient location during evaluation: PACU Anesthesia Type: General Level of consciousness: awake and alert Pain management: pain level controlled Vital Signs Assessment: post-procedure vital signs reviewed and stable Respiratory status: spontaneous breathing and respiratory function stable Cardiovascular status: stable Anesthetic complications: no   No complications documented.   Last Vitals:  Vitals:   03/05/20 1428 03/05/20 1443  BP: (!) 107/50 (!) 119/53  Pulse: 72 73  Resp: (!) 28 19  Temp: 36.5 C   SpO2: 99% 100%    Last Pain:  Vitals:   03/05/20 1443  TempSrc:   PainSc: 0-No pain                 [name] K

## 2020-03-05 NOTE — Procedures (Signed)
FIBEROPTIC BRONCHOSCOPY WITH BRONCHOALVEOLAR LAVAGE PROCEDURE NOTE  ENDOBRONCHIAL ULTRASOUND PROCEDURE NOTE    Flexible bronchoscopy was performed  by : Lanney Gins MD  assistance by : 1)Repiratory therapist  and 2)[employer] staff and 3) Anesthesia team and 4) Flouroscopy team and 5) [employer] supporting staff   Indication for the procedure was :  Pre-procedural H&P. The following assessment was performed on the day of the procedure prior to initiating sedation History:  Chest pain n Dyspnea y Hemoptysis n Cough y Fever n Other pertinent items n  Examination Vital signs -reviewed as per nursing documentation today Cardiac    Murmurs: n  Rubs : n  Gallop: n Lungs Wheezing: n Rales : n Rhonchi :y  Other pertinent findings: SOB/hypoxemia due to chronic lung disease   Pre-procedural assessment for Procedural Sedation included: Depth of sedation: As per anesthesia team  ASA Classification:  2 Mallampati airway assessment: 3    Medication list reviewed: y  The patient's interval history was taken and revealed: no new complaints The pre- procedure physical examination revealed: No new findings Refer to prior clinic note for details.  Informed Consent: Informed consent was obtained from:  patient after explanation of procedure and risks, benefits, as well as alternative procedures available.  Explanation of level of sedation and possible transfusion was also provided.    Procedural Preparation: Time out was performed and patient was identified by name and birthdate and procedure to be performed and side for sampling, if any, was specified. Pt was intubated by anesthesia.  The patient was appropriately draped.   Fiberoptic bronchoscopy with airway inspection and BAL Procedure findings:  Bronchoscope was inserted via ETT  without difficulty.  Posterior oropharynx, epiglottis, arytenoids, false cords and vocal cords were not visualized as these were bypassed  by endotracheal tube. The distal trachea was normal in circumference and appearance without mucosal, cartilaginous or branching abnormalities.  The main carina was mildly splayed . All right and left lobar airways were visualized to the Subsegmental level.  Sub- sub segmental carinae were identified in all the distal airways.   Secretions were visible in the following airways and appeared to be clear.  The mucosa was : Friable mucosa at Left mainstem down to left base with mucosal oozing of blood despite no biopsies performed in this region.   Airways were notable for:        exophytic lesions :n       extrinsic compression in the following distributions: n.       Friable mucosa: y       Neurosurgeon /pigmentation: n     Post procedure Diagnosis:   Friable mucosa without mass or exophytic lesions         Endobronchial ultrasound assisted hilar and mediastinal lymph node biopsies procedure findings: The fiberoptic bronchoscope was removed and the EBUS scope was introduced. Examination began to evaluate for pathologically enlarged lymph nodes starting on the Left  side progressing to the Right side.  All lymph node biopsies performed with 21g needle. Lymph node biopsies were sent in cytolite for all stations.  Starting from left to right station 10L was 1.2cm and was biopsied 5 times with good lymphoid shedding without atypia noted by cytotech, next station 7 was visualized 2.4cm and was bipsied 4 times with good lymphoid shedding no atypical cells reported by cytotech, lastly staion 10R was visualized to be 1.4cm and was biopsied 4 times with good lymphoid shedding without atypical cells found.  All station had one  additional specimen sent for RPMI.  Post procedure diagnosis:  Hilar lymphadenopathy   Broncho-alveolar lavage site:RML  sent for AFB, KOH, bacterial culture                             70m volume infused 534mvolume returned with cellular appearance   Estimated  Blood loss: 5 cc.  Complications included:  none immediate   Disposition: Home - spoke to husband  Follow up with Dr. AlLanney Ginsn 5 days for result discussion.     FuOttie GlazierD  [facility]

## 2020-03-05 NOTE — H&P (Signed)
Pulmonary Medicine          Date: 03/05/2020,   MRN# [id number] Kaitlin Kelley 08-10-1950     Admission                  Current       CHIEF COMPLAINT:   Hilar and mediastinal lymphadenopathy   HISTORY OF PRESENT ILLNESS   [name] is 69 y.o. female who was referred to Pulmonary clinic due to abnormal findings of mediastinal and hilar lymphadenopathy s/p oncology evaluation. Patient has developed subcostal chest pain June 2021 followed by improvement on left and worsening similar pain on right. She further developed parasthesias of fingertips and had evaluation by neurology and endocrinology. She has had signficant imaging done with findings as below. Husband accompanies patient and shares she is unable to find comfortable position. CT chest with subcarinal 2.5 cm lymph nodes; paratracheal and right hilar which were PET avid as of August 2021. Patient is a never smoker. She admits to constitutional symptoms with severe diaphoresis few times monthly requiring change of clothes and bed sheets, weight has stayed relatively stable around 220 for last 2 years +/-4 lbs. Husband reports severe and unusual fatigue over last few years including inability to stand long enough to prepare a meal or clean up. Family hx of malignancy includes brother who passed away with leukemia at age 23, aunt with breast cancer, cousin with brain cancer, another brother with prostate cancer. Patient is enrolled in research study for chronic pain management, we discussed to speak with them regarding potential malignancy.     PAST MEDICAL HISTORY   Past Medical History:  Diagnosis Date  . Arthritis   . Chronic cough   . Depression   . Diabetes mellitus without complication (HCC)   . Edema    LEGS/FEET  . GERD (gastroesophageal reflux disease)    NO MEDS  . Headache    H/O MIGRAINES  . HOH (hard of hearing)   . Hypertension   . Hypothyroidism   . IBS (irritable bowel syndrome)   .  Sleep apnea    CPAP     SURGICAL HISTORY   Past Surgical History:  Procedure Laterality Date  . EYE SURGERY    . GAS INSERTION Left 03/17/2015   Procedure: INSERTION OF GAS;  Surgeon: Marcelene Butte, MD;  Location: [facility] ORS;  Service: Ophthalmology;  Laterality: Left;  [id number]  . GASTRIC BYPASS    . HAND SURGERY     THUMB AND PINKY FINGER   . PARS PLANA VITRECTOMY Left 03/17/2015   Procedure: PARS PLANA VITRECTOMY WITH 25 GAUGE;  Surgeon: Marcelene Butte, MD;  Location: [facility] ORS;  Service: Ophthalmology;  Laterality: Left;  . SHOULDER ARTHROSCOPY    . TONSILLECTOMY    . TUBAL LIGATION       FAMILY HISTORY   Family History  Problem Relation Age of Onset  . Leukemia Brother        died at 35 years  . Prostate cancer Brother   . Breast cancer Paternal Aunt   . Brain cancer Cousin      SOCIAL HISTORY   Social History   Tobacco Use  . Smoking status: Never Smoker  . Smokeless tobacco: Never Used  Vaping Use  . Vaping Use: Never used  Substance Use Topics  . Alcohol use: Yes    Comment: RARE  . Drug use: No     MEDICATIONS    Home Medication:    Current  Medication:  Current Facility-Administered Medications:  .  butamben-tetracaine-benzocaine (CETACAINE) spray 1 spray, 1 spray, Topical, Once, Tanelle Lanzo, MD .  lidocaine (PF) (XYLOCAINE) 1 % injection 30 mL, 30 mL, Other, Once, Delaney Perona, MD .  lidocaine (XYLOCAINE) 2 % jelly 1 application, 1 application, Topical, Once, Nayely Dingus, MD .  phenylephrine (NEO-SYNEPHRINE) 0.25 % nasal spray 1 spray, 1 spray, Each Nare, Q6H PRN, Vida Rigger, MD    ALLERGIES   Patient has no known allergies.     REVIEW OF SYSTEMS    Review of Systems:  Gen:  Denies  fever, sweats, chills weigh loss  HEENT: Denies blurred vision, double vision, ear pain, eye pain, hearing loss, nose bleeds, sore throat Cardiac:  No dizziness, chest pain or heaviness, chest tightness,edema Resp:   Denies cough or  sputum porduction, shortness of breath,wheezing, hemoptysis,  Gi: Denies swallowing difficulty, stomach pain, nausea or vomiting, diarrhea, constipation, bowel incontinence Gu:  Denies bladder incontinence, burning urine Ext:   Denies Joint pain, stiffness or swelling Skin: Denies  skin rash, easy bruising or bleeding or hives Endoc:  Denies polyuria, polydipsia , polyphagia or weight change Psych:   Denies depression, insomnia or hallucinations   Other:  All other systems negative   VS: There were no vitals taken for this visit.     PHYSICAL EXAM    GENERAL:NAD, no fevers, chills, no weakness no fatigue HEAD: Normocephalic, atraumatic.  EYES: Pupils equal, round, reactive to light. Extraocular muscles intact. No scleral icterus.  MOUTH: Moist mucosal membrane. Dentition intact. No abscess noted.  EAR, NOSE, THROAT: Clear without exudates. No external lesions.  NECK: Supple. No thyromegaly. No nodules. No JVD.  PULMONARY: Diffuse coarse rhonchi right sided +wheezes CARDIOVASCULAR: S1 and S2. Regular rate and rhythm. No murmurs, rubs, or gallops. No edema. Pedal pulses 2+ bilaterally.  GASTROINTESTINAL: Soft, nontender, nondistended. No masses. Positive bowel sounds. No hepatosplenomegaly.  MUSCULOSKELETAL: No swelling, clubbing, or edema. Range of motion full in all extremities.  NEUROLOGIC: Cranial nerves II through XII are intact. No gross focal neurological deficits. Sensation intact. Reflexes intact.  SKIN: No ulceration, lesions, rashes, or cyanosis. Skin warm and dry. Turgor intact.  PSYCHIATRIC: Mood, affect within normal limits. The patient is awake, alert and oriented x 3. Insight, judgment intact.       IMAGING    NM PET Image Initial (PI) Skull Base To Thigh  Result Date: 02/11/2020 CLINICAL DATA:  Initial treatment strategy for mediastinal lymphadenopathy. EXAM: NUCLEAR MEDICINE PET SKULL BASE TO THIGH TECHNIQUE: 11.9 mCi F-18 FDG was injected intravenously.  Full-ring PET imaging was performed from the skull base to thigh after the radiotracer. CT data was obtained and used for attenuation correction and anatomic localization. Fasting blood glucose: 100 mg/dl COMPARISON:  CT abdomen/pelvis dated 01/23/2020. CTA chest dated 01/01/2020. FINDINGS: Mediastinal blood pool activity: SUV max 2.3 Liver activity: SUV max 3.5 NECK: No hypermetabolic cervical lymphadenopathy. Incidental CT findings: none CHEST: Hypermetabolic thoracic lymphadenopathy, including: --7 mm short axis left IMA node (series 3/image 75), max SUV 8.2 --7 mm short axis left prevascular node (series 3/image 76), max SUV 7.2 --16 mm short axis right paratracheal node (series 3/image 80), max SUV 19.3 --19 mm short axis right hilar node (series 3/image 83), max SUV 14.3 --18 mm short axis subcarinal node (series 3/image 87), max SUV 20.4 No suspicious pulmonary nodules. Incidental CT findings: Atherosclerotic calcifications of the aortic arch. ABDOMEN/PELVIS: 11 mm short axis periportal node (series 3/image 131), max SUV 4.4. Additional small  upper abdominal/portacaval nodes, non FDG avid. No hypermetabolic retroperitoneal or pelvic lymphadenopathy. No abnormal hypermetabolism in the liver, spleen, pancreas, or adrenal glands. Reference splenic activity 2.7. Incidental CT findings: Status post gastric bypass. Cholelithiasis (series 3/image 133), without associated inflammatory changes. Atherosclerotic calcifications the abdominal aorta and branch vessels. SKELETON: No focal hypermetabolic activity to suggest skeletal metastasis. Incidental CT findings: Degenerative changes of the visualized thoracolumbar spine. IMPRESSION: Hypermetabolic thoracic lymphadenopathy, as described above, favoring lymphoma over nodal metastases (in the absence of known primary). Additional small periportal node, also suspicious for possible lymphoma. Spleen is within normal limits. Electronically Signed   By: Charline Bills M.D.    On: 02/11/2020 08:26   CT Super D Chest Wo Contrast  Result Date: 03/02/2020 CLINICAL DATA:  Pre-procedure planning for navigational bronchoscopy. EXAM: CT CHEST WITHOUT CONTRAST TECHNIQUE: Multidetector CT imaging of the chest was performed using thin slice collimation for electromagnetic bronchoscopy planning purposes, without intravenous contrast. COMPARISON:  PET-CT 02/10/2020. FINDINGS: Cardiovascular: The heart size is normal. No substantial pericardial effusion. Atherosclerotic calcification is noted in the wall of the thoracic aorta. Mediastinum/Nodes: Similar mediastinal lymphadenopathy. Index subcarinal node measured previously at 18 mm is 21 mm today on 55/2. Similar 12 mm short axis right paratracheal lymph node. Right hilar lymphadenopathy seen previously is less well demonstrated on today's noncontrast exam. 10 mm short axis left internal mammary node is similar. The esophagus has normal imaging features. Lungs/Pleura: No suspicious pulmonary nodule or mass. No focal airspace consolidation. No pleural effusion. Upper Abdomen: Calcified gallstones noted. Stable appearance of upper normal to mildly enlarged hepatoduodenal ligament lymph nodes. Surgical changes in the stomach are compatible with gastric bypass procedure. Musculoskeletal: No worrisome lytic or sclerotic osseous abnormality. IMPRESSION: 1. Similar appearance of the thoracic lymphadenopathy. Right hilar lymphadenopathy seen previously is less well demonstrated on today's noncontrast exam. 2. Cholelithiasis. 3. Aortic Atherosclerosis (ICD10-I70.0). Electronically Signed   By: Kennith Center M.D.   On: 03/02/2020 13:53      ASSESSMENT/PLAN   Hilar and mediastinal lymphadenopathy   - reviewed imaging with patinet   - Recommendation for EBUS discussed   - patient agreeable   - reviewed procedure and answered questions Complications include: bleeding, cough, infection, pneumothorax, pneumomediastinum, possible need for chest tube  and intubation with mechanical ventilation and rarely death.  Patient relates understanding above and all additional questions have been answered.       Thank you for allowing me to participate in the care of this patient.     Patient/Family are satisfied with care plan and all questions have been answered.  This document was prepared using Dragon voice recognition software and may include unintentional dictation errors.     Vida Rigger, M.D.  Division of Pulmonary & Critical Care Medicine  [facility]

## 2020-03-05 NOTE — Anesthesia Procedure Notes (Signed)
Procedure Name: Intubation Date/Time: 03/05/2020 1:14 PM Performed by: Lynden Oxford, CRNA Pre-anesthesia Checklist: Patient identified, Emergency Drugs available, Suction available and Patient being monitored Patient Re-evaluated:Patient Re-evaluated prior to induction Oxygen Delivery Method: Circle system utilized Preoxygenation: Pre-oxygenation with 100% oxygen Induction Type: IV induction Ventilation: Mask ventilation without difficulty Laryngoscope Size: McGraph and 3 Grade View: Grade I Tube type: Oral Tube size: 8.5 mm Number of attempts: 1 Airway Equipment and Method: Stylet,  Oral airway and Video-laryngoscopy Placement Confirmation: ETT inserted through vocal cords under direct vision,  positive ETCO2 and breath sounds checked- equal and bilateral Secured at: 21 cm Tube secured with: Tape Dental Injury: Teeth and Oropharynx as per pre-operative assessment

## 2020-03-05 NOTE — Transfer of Care (Signed)
Immediate Anesthesia Transfer of Care Note  Patient: Kaitlin Kelley  Procedure(s) Performed: VIDEO BRONCHOSCOPY WITH ENDOBRONCHIAL NAVIGATION (N/A )  Patient Location: PACU  Anesthesia Type:General  Level of Consciousness: drowsy  Airway & Oxygen Therapy: Patient Spontanous Breathing and Patient connected to face mask oxygen  Post-op Assessment: Report given to RN and Post -op Vital signs reviewed and stable  Post vital signs: Reviewed and stable  Last Vitals:  Vitals Value Taken Time  BP 107/50 03/05/20 1427  Temp    Pulse 71 03/05/20 1428  Resp 27 03/05/20 1428  SpO2 97 % 03/05/20 1428  Vitals shown include unvalidated device data.  Last Pain:  Vitals:   03/05/20 1243  TempSrc:   PainSc: 0-No pain         Complications: No complications documented.

## 2020-03-05 NOTE — Anesthesia Preprocedure Evaluation (Addendum)
Anesthesia Evaluation  Patient identified by MRN, date of birth, ID band Patient awake    Reviewed: Allergy & Precautions, NPO status , Patient's Chart, lab work & pertinent test results  History of Anesthesia Complications Negative for: history of anesthetic complications  Airway Mallampati: III       Dental   Pulmonary sleep apnea and Continuous Positive Airway Pressure Ventilation , neg COPD, Not current smoker,           Cardiovascular hypertension, Pt. on medications (-) Past MI and (-) CHF (-) dysrhythmias (-) Valvular Problems/Murmurs     Neuro/Psych neg Seizures Depression    GI/Hepatic Neg liver ROS, GERD  Medicated and Controlled,  Endo/Other  diabetes, Type 2, Oral Hypoglycemic AgentsHypothyroidism   Renal/GU negative Renal ROS     Musculoskeletal   Abdominal   Peds  Hematology   Anesthesia Other Findings   Reproductive/Obstetrics                            Anesthesia Physical Anesthesia Plan  ASA: III  Anesthesia Plan: General   Post-op Pain Management:    Induction: Intravenous  PONV Risk Score and Plan: 3 and Ondansetron, Dexamethasone and Treatment may vary due to age or medical condition  Airway Management Planned: Oral ETT  Additional Equipment:   Intra-op Plan:   Post-operative Plan:   Informed Consent: I have reviewed the patients History and Physical, chart, labs and discussed the procedure including the risks, benefits and alternatives for the proposed anesthesia with the patient or authorized representative who has indicated his/her understanding and acceptance.       Plan Discussed with:   Anesthesia Plan Comments:         Anesthesia Quick Evaluation

## 2020-03-08 ENCOUNTER — Encounter: Payer: Self-pay | Admitting: Pulmonary Disease

## 2020-03-08 LAB — CULTURE, BAL-QUANTITATIVE W GRAM STAIN
Culture: NO GROWTH
Gram Stain: NONE SEEN

## 2020-03-09 LAB — COMP PANEL: LEUKEMIA/LYMPHOMA

## 2020-03-09 LAB — ACID FAST SMEAR (AFB, MYCOBACTERIA): Acid Fast Smear: NEGATIVE

## 2020-03-09 LAB — CYTOLOGY - NON PAP

## 2020-04-21 LAB — ACID FAST CULTURE WITH REFLEXED SENSITIVITIES (MYCOBACTERIA): Acid Fast Culture: NEGATIVE

## 2020-04-23 ENCOUNTER — Other Ambulatory Visit: Payer: Self-pay

## 2020-04-23 ENCOUNTER — Encounter (INDEPENDENT_AMBULATORY_CARE_PROVIDER_SITE_OTHER): Payer: Self-pay | Admitting: Ophthalmology

## 2020-04-23 ENCOUNTER — Ambulatory Visit (INDEPENDENT_AMBULATORY_CARE_PROVIDER_SITE_OTHER): Payer: Medicare Other | Admitting: Ophthalmology

## 2020-04-23 DIAGNOSIS — I10 Essential (primary) hypertension: Secondary | ICD-10-CM

## 2020-04-23 DIAGNOSIS — Z8669 Personal history of other diseases of the nervous system and sense organs: Secondary | ICD-10-CM

## 2020-04-23 DIAGNOSIS — H35033 Hypertensive retinopathy, bilateral: Secondary | ICD-10-CM | POA: Diagnosis not present

## 2020-04-23 DIAGNOSIS — H3581 Retinal edema: Secondary | ICD-10-CM

## 2020-04-23 DIAGNOSIS — H30033 Focal chorioretinal inflammation, peripheral, bilateral: Secondary | ICD-10-CM

## 2020-04-23 DIAGNOSIS — E113213 Type 2 diabetes mellitus with mild nonproliferative diabetic retinopathy with macular edema, bilateral: Secondary | ICD-10-CM

## 2020-04-23 DIAGNOSIS — Z961 Presence of intraocular lens: Secondary | ICD-10-CM

## 2020-04-23 NOTE — Progress Notes (Signed)
[facility] - Clinic Note  04/23/2020     CHIEF COMPLAINT Patient presents for Retina Evaluation   HISTORY OF PRESENT ILLNESS: Tanner Vigna is a 69 y.o. female who presents to the clinic today for:   HPI    Retina Evaluation    In both eyes.  This started 1 month ago.  Duration of 1 month.  Associated Symptoms Floaters, Pain and Glare.  I, the attending physician,  performed the HPI with the patient and updated documentation appropriately.          Comments    Patient here for Retina Evaluation. 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 Patient states vision when wakes up in am hard to see. Hard to read words on TV. Like smeared together. Dr saw white spots on back of eye.        Last edited by Rennis Chris, MD on 04/25/2020 12:52 AM. (History)    pt here on the referral of Dr. Sherryll Burger for uveitis and FA, pt last saw Dr. Sherryll Burger on 09.13.21, pt has hx of RD OS which was repaired by a dr at [facility] a couple years ago, pt is taking azathioprine per Dr. Sherryll Burger, pt states she was just dx with sarcoidosis and has had several medications added to her lately, pt states she feels like her vision has gotten worse since the first time she saw Dr. Sherryll Burger, she states her blood sugar is very low in the morning, which causes her vision to be blurry, she states it will clear up after she eats  Referring physician: Eber Jones, MD [address] [place],  [place] [address]  HISTORICAL INFORMATION:   Selected notes from the MEDICAL RECORD NUMBER Referred by Dr. Gae Bon for uveitis, FA LEE:  Ocular Hx- PMH-    CURRENT MEDICATIONS: Current Outpatient Medications (Ophthalmic Drugs)  Medication Sig  . dorzolamide (TRUSOPT) 2 % ophthalmic solution Place 1 drop into both eyes in the morning and at bedtime.    No current facility-administered medications for this visit. (Ophthalmic Drugs)   Current Outpatient Medications (Other)  Medication Sig  . amLODipine (NORVASC)  5 MG tablet Take 5 mg by mouth at bedtime.   Marland Kitchen azelastine (ASTELIN) 0.1 % nasal spray Place 1 spray into both nostrils as needed for allergies.  . Bacillus Coagulans-Inulin (ALIGN PREBIOTIC-PROBIOTIC PO) Take 1 capsule by mouth daily.  . Cholecalciferol 50 MCG (2000 UT) CAPS Take 2,000 Units by mouth daily.   . Cyanocobalamin 3000 MCG CAPS Take 3,000 mcg by mouth daily.   Marland Kitchen dicyclomine (BENTYL) 10 MG capsule Take 10 mg by mouth 4 (four) times daily as needed for spasms.   . Dulaglutide (TRULICITY) 1.5 MG/0.5ML SOPN Inject 0.5 mLs into the skin once a week. (Patient not taking: Reported on 02/19/2020)  . DULoxetine (CYMBALTA) 60 MG capsule Take 60 mg by mouth 2 (two) times daily.   Marland Kitchen gabapentin (NEURONTIN) 300 MG capsule Take 300 mg by mouth 3 (three) times daily.  Marland Kitchen glipiZIDE (GLUCOTROL) 10 MG tablet Take 10 mg by mouth in the morning and at bedtime.   . insulin detemir (LEVEMIR) 100 UNIT/ML injection Inject 30 Units into the skin at bedtime.   . Insulin Glargine-Lixisenatide (SOLIQUA) 100-33 UNT-MCG/ML SOPN Inject 30 Units into the skin at bedtime.  Marland Kitchen levothyroxine (SYNTHROID, LEVOTHROID) 75 MCG tablet Take 75 mcg by mouth See admin instructions. Take 75 mcg by mouth daily before breakfast Monday through Saturday  . mirtazapine (REMERON) 15  MG tablet Take 15 mg by mouth at bedtime. (Patient not taking: Reported on 02/19/2020)  . Multiple Vitamins-Minerals (AIRBORNE PO) Take 2 tablets by mouth in the morning and at bedtime.  . rosuvastatin (CRESTOR) 40 MG tablet Take 40 mg by mouth at bedtime.   . sulfaSALAzine (AZULFIDINE) 500 MG tablet Take 1,000 mg by mouth 2 (two) times daily.  . TURMERIC PO Take 1 capsule by mouth daily.    No current facility-administered medications for this visit. (Other)      REVIEW OF SYSTEMS: ROS    Positive for: Endocrine, Eyes, Respiratory   Last edited by Laddie Aquas, COA on 04/23/2020 10:33 AM. (History)       ALLERGIES No Known Allergies  PAST  MEDICAL HISTORY Past Medical History:  Diagnosis Date  . Arthritis   . Chronic cough   . Depression   . Diabetes mellitus without complication (HCC)   . Edema    LEGS/FEET  . GERD (gastroesophageal reflux disease)    NO MEDS  . Headache    H/O MIGRAINES  . HOH (hard of hearing)   . Hypertension   . Hypothyroidism   . IBS (irritable bowel syndrome)   . Sleep apnea    CPAP   Past Surgical History:  Procedure Laterality Date  . EYE SURGERY    . GAS INSERTION Left 03/17/2015   Procedure: INSERTION OF GAS;  Surgeon: Marcelene Butte, MD;  Location: [facility] ORS;  Service: Ophthalmology;  Laterality: Left;  [id number]  . GASTRIC BYPASS    . HAND SURGERY     THUMB AND PINKY FINGER   . PARS PLANA VITRECTOMY Left 03/17/2015   Procedure: PARS PLANA VITRECTOMY WITH 25 GAUGE;  Surgeon: Marcelene Butte, MD;  Location: [facility] ORS;  Service: Ophthalmology;  Laterality: Left;  . SHOULDER ARTHROSCOPY    . TONSILLECTOMY    . TUBAL LIGATION    . VIDEO BRONCHOSCOPY WITH ENDOBRONCHIAL NAVIGATION N/A 03/05/2020   Procedure: VIDEO BRONCHOSCOPY WITH ENDOBRONCHIAL NAVIGATION;  Surgeon: Vida Rigger, MD;  Location: [facility] ORS;  Service: Thoracic;  Laterality: N/A;    FAMILY HISTORY Family History  Problem Relation Age of Onset  . Leukemia Brother        died at 26 years  . Diabetes Brother   . Prostate cancer Brother   . Breast cancer Paternal Aunt   . Brain cancer Cousin   . Diabetes Father   . Macular degeneration Niece     SOCIAL HISTORY Social History   Tobacco Use  . Smoking status: Never Smoker  . Smokeless tobacco: Never Used  Vaping Use  . Vaping Use: Never used  Substance Use Topics  . Alcohol use: Yes    Comment: RARE  . Drug use: No         OPHTHALMIC EXAM:  Base Eye Exam    Visual Acuity (Snellen - Linear)      Right Left   Dist [place] 20/40 20/50 -2   Dist ph [place] NI NI       Tonometry (Tonopen, 10:29 AM)      Right Left   Pressure 19 19       Pupils      Dark Light  Shape React APD   Right 3 2 Round Brisk None   Left 3 2 Round Brisk None       Visual Fields (Counting fingers)      Left Right    Full Full       Extraocular Movement  Right Left    Full, Ortho Full, Ortho       Neuro/Psych    Oriented x3: Yes   Mood/Affect: Normal       Dilation    Both eyes: 1.0% Mydriacyl, 2.5% Phenylephrine @ 10:29 AM        Slit Lamp and Fundus Exam    Slit Lamp Exam      Right Left   Lids/Lashes Normal Normal   Conjunctiva/Sclera White and quiet White and quiet   Cornea Mild arcus, 1+ Punctate epithelial erosions, well healed temporal cataract wounds Mild arcus, 1-2+ Punctate epithelial erosions, well healed temporal cataract wounds   Anterior Chamber Deep and quiet, no cell or flare Deep and quiet, no cell or flare   Iris Round and dilated Round and dilated   Lens PC IOL in good position PC IOL in good position   Vitreous Vitreous syneresis, Posterior vitreous detachment, vitreous condensations, +haze post vitrectomy, trace Cell/pigment, mild vitreous condensations, mild haze       Fundus Exam      Right Left   Disc Pink and Sharp, +elevation superior, +splinter heme at 0130, mild hyperemia Sharp rim, +mild elevation superior   C/D Ratio 0.2 0.3   Macula Flat, Blunted foveal reflex, RPE mottling and clumping, No heme or edema Flat, Blunted foveal reflex, RPE mottling and clumping, focal ERM temporal, No heme or edema   Vessels attenuated, Tortuous, mild AV crossing changes attenuated, Tortuous, mild AV crossing changes   Periphery Attached, rare MA/IRH    Attached, scattered focal depigmented lesions, extensive peripheral CR scarring greatest temporal and inferiorly quads from RD repair          IMAGING AND PROCEDURES  Imaging and Procedures for 04/23/2020  OCT, Retina - OU - Both Eyes       Right Eye Quality was good. Central Foveal Thickness: 318. Progression has no prior data. Findings include normal foveal contour, intraretinal  fluid, no SRF (Mild cystic changes/IRF centrally).   Left Eye Quality was good. Central Foveal Thickness: 342. Progression has no prior data. Findings include normal foveal contour, intraretinal fluid, no SRF, epiretinal membrane (ERM with central band of IRF/edema).   Notes *Images captured and stored on drive  Diagnosis / Impression:  OD: Mild cystic changes/IRF centrally OS: ERM with central band of IRF/edema  Clinical management:  See below  Abbreviations: NFP - Normal foveal profile. CME - cystoid macular edema. PED - pigment epithelial detachment. IRF - intraretinal fluid. SRF - subretinal fluid. EZ - ellipsoid zone. ERM - epiretinal membrane. ORA - outer retinal atrophy. ORT - outer retinal tubulation. SRHM - subretinal hyper-reflective material. IRHM - intraretinal hyper-reflective material        Fluorescein Angiography Optos (Transit OD)       Right Eye   Progression has no prior data. Early phase findings include staining, vascular perfusion defect (hyperfluorescent staining of disc; peripheral vascular non-perfusion). Mid/Late phase findings include staining, vascular perfusion defect, leakage (hyperfluorescent staining of disc; late perifoveal leakage, peripheral vascular non-perfusion defect).   Left Eye   Progression has no prior data. Early phase findings include staining, vascular perfusion defect (hyperfluorescent staining of disc; window defects corresponding to areas of RD repair). Mid/Late phase findings include staining, leakage (hyperfluorescent staining of disc; late perifoveal and scattered focal leakage,  window defects corresponding to areas of RD repair).   Notes **Images stored on drive**  Impression: OD: hyperfluorescent staining of disc; late perifoveal leakage, peripheral vascular non-perfusion defect OS: hyperfluorescent staining  of disc; late perifoveal and scattered focal leakage,  window defects corresponding to areas of RD repair                  ASSESSMENT/PLAN:    ICD-10-CM   1. Peripheral focal chorioretinal inflammation of both eyes  H30.033   2. Retinal edema  H35.81 OCT, Retina - OU - Both Eyes  3. Essential hypertension  I10   4. Hypertensive retinopathy of both eyes  H35.033 Fluorescein Angiography Optos (Transit OD)  5. 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 History of retinal detachment  Z86.69   7. Pseudophakia of both eyes  Z96.1     1,2. Peripheral focal chorioretinal inflammation both eyes  - pt diagnosed with sarcoidosis--has subcarinal and hilar lymphadenopathy  - started on 7.5 mg po MTX/wkly + folic acid by pulmonology and 50 mg Imuran by Dr. Sherryll Burger  - referred by Dr. Sherryll Burger for FA  - FA shows late perifoveal leakage OU and hyperfluorescence of disc  - findings and images discussed with patient and shared with Dr. Sherryll Burger  - management per Dr. Sherryll Burger -- pt to see him this afternoon  - f/u here prn  3. Mild non-proliferative diabetic retinopathy, both eyes - The incidence, risk factors for progression, natural history and treatment options for diabetic retinopathy were discussed with patient.   - The need for close monitoring of blood glucose, blood pressure, and serum lipids, avoiding cigarette or any type of tobacco, and the need for long term follow up was also discussed with patient. - exam shows scattered MA/IRH - FA with minimal MA OU  4. Hx of RD OS  - repaired by Dr. Marcelene Butte (09.2016) -- 25g PPV w/ laser and gas OS  5,6. Hypertensive retinopathy OU - discussed importance of tight BP control - monitor  7. Pseudophakia OU  - s/p CE/IOL OU  - IOL in good position, doing well - monitor    Ophthalmic Meds Ordered this visit:  No orders of the defined types were placed in this encounter.      Return if symptoms worsen or fail to improve.  There are no Patient  Instructions on file for this visit.   Explained the diagnoses, plan, and follow up with the patient and they expressed understanding.  Patient expressed understanding of the importance of proper follow up care.   This document serves as a record of services personally performed by Karie Chimera, MD, PhD. It was created on their behalf by Glee Arvin. Manson Passey, OA an ophthalmic technician. The creation of this record is the provider's dictation and/or activities during the visit.    Electronically signed by: Glee Arvin. [place], [place] 10.15.2021 1:06 AM   Karie Chimera, M.D., Ph.D. Diseases & Surgery of the Retina and Vitreous [facility]  I have reviewed the above documentation for accuracy and completeness, and I agree with the above. Karie Chimera, M.D., Ph.D. 04/25/20 1:06 AM   Abbreviations: M myopia (nearsighted); A astigmatism; H hyperopia (farsighted); P presbyopia; Mrx spectacle prescription;  CTL contact lenses; OD right eye; OS left eye; OU both eyes  XT exotropia; ET esotropia; PEK punctate epithelial keratitis; PEE punctate epithelial erosions; DES dry eye syndrome; MGD meibomian gland dysfunction; ATs artificial tears; PFAT's preservative free artificial tears; NSC nuclear sclerotic cataract; PSC posterior subcapsular cataract; ERM epi-retinal membrane; PVD posterior vitreous detachment; RD retinal  detachment; DM diabetes mellitus; DR diabetic retinopathy; NPDR non-proliferative diabetic retinopathy; PDR proliferative diabetic retinopathy; CSME clinically significant macular edema; DME diabetic macular edema; dbh dot blot hemorrhages; CWS cotton wool spot; POAG primary open angle glaucoma; C/D cup-to-disc ratio; HVF humphrey visual field; GVF goldmann visual field; OCT optical coherence tomography; IOP intraocular pressure; BRVO Branch retinal vein occlusion; CRVO central retinal vein occlusion; CRAO central retinal artery occlusion; BRAO branch retinal artery  occlusion; RT retinal tear; SB scleral buckle; PPV pars plana vitrectomy; VH Vitreous hemorrhage; PRP panretinal laser photocoagulation; IVK intravitreal kenalog; VMT vitreomacular traction; MH Macular hole;  NVD neovascularization of the disc; NVE neovascularization elsewhere; AREDS age related eye disease study; ARMD age related macular degeneration; POAG primary open angle glaucoma; EBMD epithelial/anterior basement membrane dystrophy; ACIOL anterior chamber intraocular lens; IOL intraocular lens; PCIOL posterior chamber intraocular lens; Phaco/IOL phacoemulsification with intraocular lens placement; PRK photorefractive keratectomy; LASIK laser assisted in situ keratomileusis; HTN hypertension; DM diabetes mellitus; COPD chronic obstructive pulmonary disease

## 2020-05-22 ENCOUNTER — Other Ambulatory Visit: Payer: Self-pay | Admitting: Pulmonary Disease

## 2020-05-24 DIAGNOSIS — Z9989 Dependence on other enabling machines and devices: Secondary | ICD-10-CM | POA: Diagnosis not present

## 2020-05-24 DIAGNOSIS — Z794 Long term (current) use of insulin: Secondary | ICD-10-CM | POA: Diagnosis not present

## 2020-05-24 DIAGNOSIS — I10 Essential (primary) hypertension: Secondary | ICD-10-CM | POA: Diagnosis not present

## 2020-05-24 DIAGNOSIS — E785 Hyperlipidemia, unspecified: Secondary | ICD-10-CM | POA: Diagnosis not present

## 2020-05-24 DIAGNOSIS — E1129 Type 2 diabetes mellitus with other diabetic kidney complication: Secondary | ICD-10-CM | POA: Diagnosis not present

## 2020-05-24 DIAGNOSIS — R809 Proteinuria, unspecified: Secondary | ICD-10-CM | POA: Diagnosis not present

## 2020-05-24 DIAGNOSIS — Z79899 Other long term (current) drug therapy: Secondary | ICD-10-CM | POA: Diagnosis not present

## 2020-05-24 DIAGNOSIS — G4733 Obstructive sleep apnea (adult) (pediatric): Secondary | ICD-10-CM | POA: Diagnosis not present

## 2020-05-24 DIAGNOSIS — N1831 Chronic kidney disease, stage 3a: Secondary | ICD-10-CM | POA: Diagnosis not present

## 2020-05-24 DIAGNOSIS — E1122 Type 2 diabetes mellitus with diabetic chronic kidney disease: Secondary | ICD-10-CM | POA: Diagnosis not present

## 2020-05-24 DIAGNOSIS — E034 Atrophy of thyroid (acquired): Secondary | ICD-10-CM | POA: Diagnosis not present

## 2020-05-24 DIAGNOSIS — D869 Sarcoidosis, unspecified: Secondary | ICD-10-CM | POA: Diagnosis not present

## 2020-05-24 DIAGNOSIS — Z0000 Encounter for general adult medical examination without abnormal findings: Secondary | ICD-10-CM | POA: Diagnosis not present

## 2020-05-24 DIAGNOSIS — E113299 Type 2 diabetes mellitus with mild nonproliferative diabetic retinopathy without macular edema, unspecified eye: Secondary | ICD-10-CM | POA: Diagnosis not present

## 2020-06-09 DIAGNOSIS — I10 Essential (primary) hypertension: Secondary | ICD-10-CM | POA: Diagnosis not present

## 2020-06-09 DIAGNOSIS — Z9989 Dependence on other enabling machines and devices: Secondary | ICD-10-CM | POA: Diagnosis not present

## 2020-06-09 DIAGNOSIS — G4733 Obstructive sleep apnea (adult) (pediatric): Secondary | ICD-10-CM | POA: Diagnosis not present

## 2020-07-06 DIAGNOSIS — H30033 Focal chorioretinal inflammation, peripheral, bilateral: Secondary | ICD-10-CM | POA: Diagnosis not present

## 2020-07-06 DIAGNOSIS — H3581 Retinal edema: Secondary | ICD-10-CM | POA: Diagnosis not present

## 2020-07-06 DIAGNOSIS — H3322 Serous retinal detachment, left eye: Secondary | ICD-10-CM | POA: Diagnosis not present

## 2020-07-06 DIAGNOSIS — E113213 Type 2 diabetes mellitus with mild nonproliferative diabetic retinopathy with macular edema, bilateral: Secondary | ICD-10-CM | POA: Diagnosis not present

## 2020-07-06 DIAGNOSIS — Z794 Long term (current) use of insulin: Secondary | ICD-10-CM | POA: Diagnosis not present

## 2020-07-12 ENCOUNTER — Other Ambulatory Visit: Payer: Self-pay

## 2020-07-12 ENCOUNTER — Encounter: Payer: Self-pay | Admitting: Podiatry

## 2020-07-12 ENCOUNTER — Ambulatory Visit: Payer: Medicare Other | Admitting: Podiatry

## 2020-07-12 DIAGNOSIS — M7752 Other enthesopathy of left foot: Secondary | ICD-10-CM

## 2020-07-12 DIAGNOSIS — M719 Bursopathy, unspecified: Secondary | ICD-10-CM | POA: Insufficient documentation

## 2020-07-12 DIAGNOSIS — M7751 Other enthesopathy of right foot: Secondary | ICD-10-CM | POA: Diagnosis not present

## 2020-07-12 NOTE — Progress Notes (Signed)
This patient returns to my office for at risk foot care.  This patient requires this care by a professional since this patient will be at risk due to having diabetic neuropathy.  This patient says she is having pain on the outside midfoot both feet.  She says this area is painful intermittantly  when she walks No history of trauma and no self treatment provided.This patient presents for at risk foot care today.  General Appearance  Alert, conversant and in no acute stress.  Vascular  Dorsalis pedis  are palpable  bilaterally. Posterior tibial pulses are not palpable due to swelling. Capillary return is within normal limits  bilaterally. Temperature is within normal limits  bilaterally.  Neurologic  Senn-Weinstein monofilament wire test within normal limits  bilaterally. Muscle power within normal limits bilaterally.  Nails Normotropic nails with no evidence of infection or drainage.  Orthopedic  No limitations of motion  feet .  No crepitus or effusions noted.  No bony pathology or digital deformities noted. Prominent fifth metabase plantarly right greater than left.  Skin  normotropic skin with no porokeratosis noted bilaterally.  No signs of infections or ulcers noted.     Bursitis fifth metabase bilaterally.  Consent was obtained for treatment procedures.   Mechanical debridement of nails 1-5  bilaterally performed with a nail nipper.  Filed with dremel without incident.    IE  Padding added to shoes.  Discussed this condition with this patient.     RTC prn   Helane Gunther DPM   Helane Gunther DPM

## 2020-07-15 ENCOUNTER — Other Ambulatory Visit: Payer: Self-pay | Admitting: Pulmonary Disease

## 2020-07-15 DIAGNOSIS — D869 Sarcoidosis, unspecified: Secondary | ICD-10-CM

## 2020-07-22 ENCOUNTER — Other Ambulatory Visit: Payer: Self-pay

## 2020-07-22 ENCOUNTER — Ambulatory Visit
Admission: RE | Admit: 2020-07-22 | Discharge: 2020-07-22 | Disposition: A | Discharge status: Home / Self Care | Payer: Medicare Other | Source: Ambulatory Visit | Attending: Pulmonary Disease | Admitting: Pulmonary Disease

## 2020-07-22 DIAGNOSIS — D869 Sarcoidosis, unspecified: Secondary | ICD-10-CM | POA: Diagnosis not present

## 2020-11-16 ENCOUNTER — Other Ambulatory Visit: Payer: Self-pay | Admitting: Pulmonary Disease

## 2020-11-16 DIAGNOSIS — R590 Localized enlarged lymph nodes: Secondary | ICD-10-CM

## 2020-11-16 DIAGNOSIS — D869 Sarcoidosis, unspecified: Secondary | ICD-10-CM

## 2020-12-01 IMAGING — DX DG CHEST 1V
1 series · 1 of 1 positions shown · non-contrast
Comparison: January 01, 2020.

CLINICAL DATA: Status post bronchoscopy.

EXAM:
CHEST  1 VIEW

[chest ap]
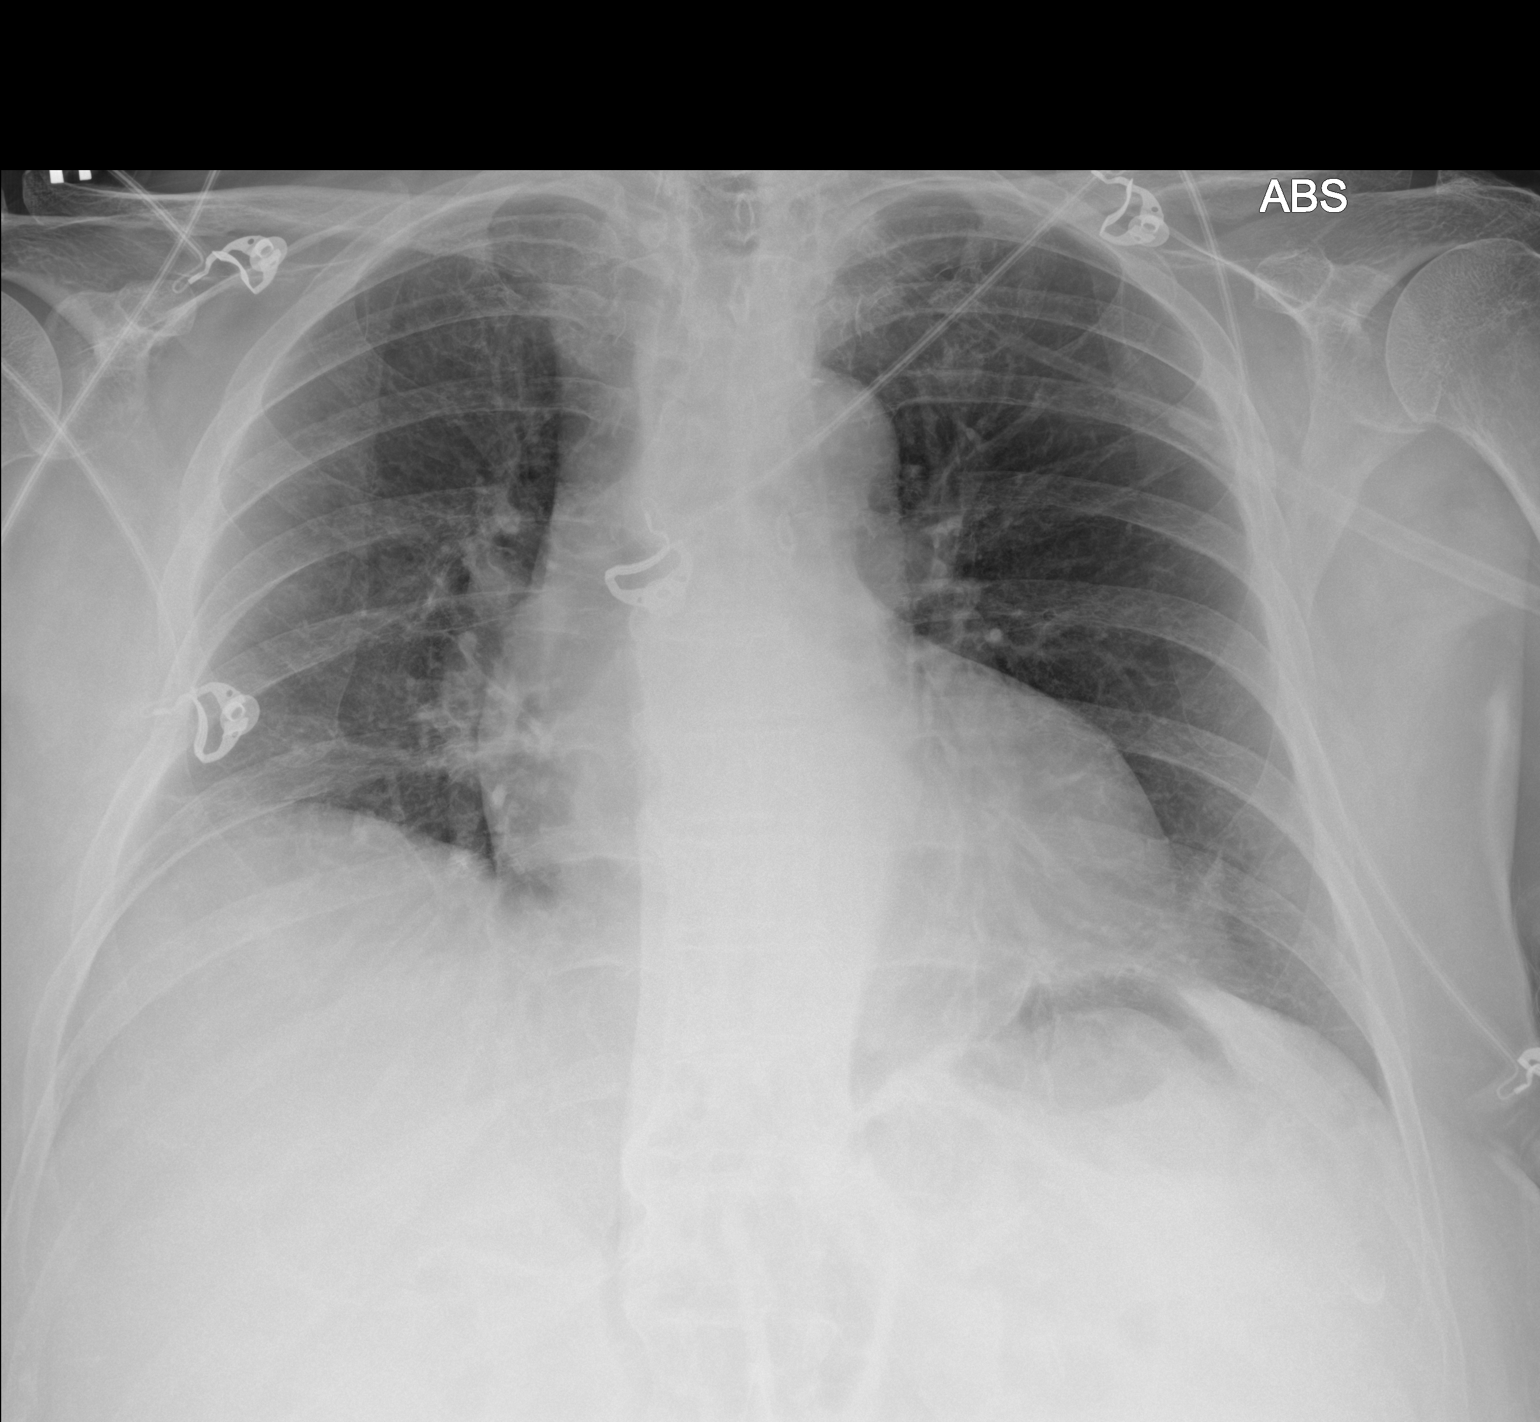

[1 of 1 positions shown; findings below may reference images not displayed]

FINDINGS: The heart size and mediastinal contours are within normal limits. No
pneumothorax or pleural effusion is noted. Left lung is clear.
Minimal right basilar subsegmental atelectasis is noted. The
visualized skeletal structures are unremarkable.
IMPRESSION: Minimal right basilar subsegmental atelectasis. No pneumothorax is
noted.

## 2020-12-16 ENCOUNTER — Ambulatory Visit: Payer: Medicare Other | Admitting: Podiatry

## 2020-12-16 ENCOUNTER — Encounter: Payer: Self-pay | Admitting: Podiatry

## 2020-12-16 ENCOUNTER — Ambulatory Visit (INDEPENDENT_AMBULATORY_CARE_PROVIDER_SITE_OTHER): Payer: Medicare Other

## 2020-12-16 ENCOUNTER — Other Ambulatory Visit: Payer: Self-pay

## 2020-12-16 DIAGNOSIS — S92503A Displaced unspecified fracture of unspecified lesser toe(s), initial encounter for closed fracture: Secondary | ICD-10-CM

## 2020-12-16 NOTE — Progress Notes (Signed)
Subjective:  Patient ID: Kaitlin Kelley, female    DOB: 11-12-1950,  MRN: [id number]  Chief Complaint  Patient presents with   Toe Pain    Right foot 4th toe pain PT stated that about a month ago she jammed her foot into a stool in her kitchen and has been having pain ever since     70 y.o. female presents with the above complaint.  Patient presents with complaint of right fourth digit pain.  Patient states she jammed her foot against the stool.  This happened about a month ago she states is painful to touch there are some swelling associated with it.  Original date of injury was 11/15/2020.  She tried protecting and she has been ambulating regular sneakers.  She has not seen anyone else prior to seeing me for treatment options.  She denies any other acute complaints she has not done any buddy splinting.   Review of Systems: Negative except as noted in the HPI. Denies N/V/F/Ch.  Past Medical History:  Diagnosis Date   Arthritis    Chronic cough    Depression    Diabetes mellitus without complication (HCC)    Edema    LEGS/FEET   GERD (gastroesophageal reflux disease)    NO MEDS   Headache    H/O MIGRAINES   HOH (hard of hearing)    Hypertension    Hypothyroidism    IBS (irritable bowel syndrome)    Sleep apnea    CPAP    Current Outpatient Medications:    amLODipine (NORVASC) 5 MG tablet, Take 5 mg by mouth at bedtime. , Disp: , Rfl:    azelastine (ASTELIN) 0.1 % nasal spray, Place 1 spray into both nostrils as needed for allergies., Disp: , Rfl:    Bacillus Coagulans-Inulin (ALIGN PREBIOTIC-PROBIOTIC PO), Take 1 capsule by mouth daily., Disp: , Rfl:    Cholecalciferol 50 MCG (2000 UT) CAPS, Take 2,000 Units by mouth daily. , Disp: , Rfl:    Cyanocobalamin 3000 MCG CAPS, Take 3,000 mcg by mouth daily. , Disp: , Rfl:    dicyclomine (BENTYL) 10 MG capsule, Take 10 mg by mouth 4 (four) times daily as needed for spasms. , Disp: , Rfl:    dorzolamide (TRUSOPT) 2 % ophthalmic  solution, Place 1 drop into both eyes in the morning and at bedtime. , Disp: , Rfl:    Dulaglutide (TRULICITY) 1.5 MG/0.5ML SOPN, Inject 0.5 mLs into the skin once a week. (Patient not taking: Reported on 02/19/2020), Disp: , Rfl:    DULoxetine (CYMBALTA) 60 MG capsule, Take 60 mg by mouth 2 (two) times daily. , Disp: , Rfl:    gabapentin (NEURONTIN) 300 MG capsule, Take 300 mg by mouth 3 (three) times daily., Disp: , Rfl:    glipiZIDE (GLUCOTROL) 10 MG tablet, Take 10 mg by mouth in the morning and at bedtime. , Disp: , Rfl:    insulin detemir (LEVEMIR) 100 UNIT/ML injection, Inject 30 Units into the skin at bedtime. , Disp: , Rfl:    Insulin Glargine-Lixisenatide (SOLIQUA) 100-33 UNT-MCG/ML SOPN, Inject 30 Units into the skin at bedtime., Disp: , Rfl:    levothyroxine (SYNTHROID, LEVOTHROID) 75 MCG tablet, Take 75 mcg by mouth See admin instructions. Take 75 mcg by mouth daily before breakfast Monday through Saturday, Disp: , Rfl:    mirtazapine (REMERON) 15 MG tablet, Take 15 mg by mouth at bedtime., Disp: , Rfl:    Multiple Vitamins-Minerals (AIRBORNE PO), Take 2 tablets by mouth in the morning  and at bedtime., Disp: , Rfl:    rosuvastatin (CRESTOR) 40 MG tablet, Take 40 mg by mouth at bedtime. , Disp: , Rfl:    sulfaSALAzine (AZULFIDINE) 500 MG tablet, Take 1,000 mg by mouth 2 (two) times daily., Disp: , Rfl:    TURMERIC PO, Take 1 capsule by mouth daily. , Disp: , Rfl:   Social History   Tobacco Use  Smoking Status Never  Smokeless Tobacco Never    No Known Allergies Objective:  There were no vitals filed for this visit. There is no height or weight on file to calculate BMI. Constitutional Well developed. Well nourished.  Vascular Dorsalis pedis pulses palpable bilaterally. Posterior tibial pulses palpable bilaterally. Capillary refill normal to all digits.  No cyanosis or clubbing noted. Pedal hair growth normal.  Neurologic Normal speech. Oriented to person, place, and  time. Epicritic sensation to light touch grossly present bilaterally.  Dermatologic Nails well groomed and normal in appearance. No open wounds. No skin lesions.  Orthopedic: Pain on palpation of right fourth digit.  Pain with range of motion of the PIPJ joint as well as slightly at the metatarsophalangeal joint.  No deep intra-articular pain noted at the MPJ of the fourth.  No extensor or flexor tendinitis noted.   Radiographs: 3 views of skeletally mature adult right foot: Right fourth digit proximal phalanx noted.  Callus formation noted.  The fracture and the capital fragment are within normal limits in terms of alignment and displacement. Assessment:   1. Closed fracture of phalanx of fourth toe    Plan:  Patient was evaluated and treated and all questions answered.  Right fourth digit proximal phalanx fracture -I explained patient the etiology of fracture and various treatment options were extensively discussed. -Given that patient has not had an allowed adequate time for healing of the right fourth toe and continues pain I believe patient would benefit from surgical shoe immobilization.  I have asked her to wear it for next 6 weeks.  She states understanding and will do so. -I also discussed buddy splinting as well.  I shown her how to buddy splint her toes.  She states understanding will do so as well. -During next clinical visit I would treat her clinically as opposed to radiographically and if her pain has resolved I will have her transition to regular shoes.  She states understanding  No follow-ups on file.

## 2021-01-27 ENCOUNTER — Encounter: Payer: Self-pay | Admitting: Podiatry

## 2021-01-27 ENCOUNTER — Other Ambulatory Visit: Payer: Self-pay

## 2021-01-27 ENCOUNTER — Ambulatory Visit (INDEPENDENT_AMBULATORY_CARE_PROVIDER_SITE_OTHER): Payer: Medicare Other | Admitting: Podiatry

## 2021-01-27 DIAGNOSIS — S92503A Displaced unspecified fracture of unspecified lesser toe(s), initial encounter for closed fracture: Secondary | ICD-10-CM | POA: Diagnosis not present

## 2021-01-27 NOTE — Progress Notes (Signed)
Subjective:  Patient ID: Kaitlin Kelley, female    DOB: Jul 04, 1951,  MRN: [id number]  Chief Complaint  Patient presents with   Fracture    PT stated that she is doing okay but does still have some pain     70 y.o. female presents with the above complaint.  Patient presents with follow-up to right fourth digit fracture.  She states she is doing a lot better.  She does not have any pain.  She occasionally has numbness tingling.  She denies any other acute complaints.   Review of Systems: Negative except as noted in the HPI. Denies N/V/F/Ch.  Past Medical History:  Diagnosis Date   Arthritis    Chronic cough    Depression    Diabetes mellitus without complication (HCC)    Edema    LEGS/FEET   GERD (gastroesophageal reflux disease)    NO MEDS   Headache    H/O MIGRAINES   HOH (hard of hearing)    Hypertension    Hypothyroidism    IBS (irritable bowel syndrome)    Sleep apnea    CPAP    Current Outpatient Medications:    amLODipine (NORVASC) 5 MG tablet, Take 5 mg by mouth at bedtime. , Disp: , Rfl:    azelastine (ASTELIN) 0.1 % nasal spray, Place 1 spray into both nostrils as needed for allergies., Disp: , Rfl:    Bacillus Coagulans-Inulin (ALIGN PREBIOTIC-PROBIOTIC PO), Take 1 capsule by mouth daily., Disp: , Rfl:    Cholecalciferol 50 MCG (2000 UT) CAPS, Take 2,000 Units by mouth daily. , Disp: , Rfl:    Cyanocobalamin 3000 MCG CAPS, Take 3,000 mcg by mouth daily. , Disp: , Rfl:    dicyclomine (BENTYL) 10 MG capsule, Take 10 mg by mouth 4 (four) times daily as needed for spasms. , Disp: , Rfl:    dorzolamide (TRUSOPT) 2 % ophthalmic solution, Place 1 drop into both eyes in the morning and at bedtime. , Disp: , Rfl:    Dulaglutide (TRULICITY) 1.5 MG/0.5ML SOPN, Inject 0.5 mLs into the skin once a week. (Patient not taking: Reported on 02/19/2020), Disp: , Rfl:    DULoxetine (CYMBALTA) 60 MG capsule, Take 60 mg by mouth 2 (two) times daily. , Disp: , Rfl:    gabapentin  (NEURONTIN) 300 MG capsule, Take 300 mg by mouth 3 (three) times daily., Disp: , Rfl:    glipiZIDE (GLUCOTROL) 10 MG tablet, Take 10 mg by mouth in the morning and at bedtime. , Disp: , Rfl:    insulin detemir (LEVEMIR) 100 UNIT/ML injection, Inject 30 Units into the skin at bedtime. , Disp: , Rfl:    Insulin Glargine-Lixisenatide (SOLIQUA) 100-33 UNT-MCG/ML SOPN, Inject 30 Units into the skin at bedtime., Disp: , Rfl:    levothyroxine (SYNTHROID, LEVOTHROID) 75 MCG tablet, Take 75 mcg by mouth See admin instructions. Take 75 mcg by mouth daily before breakfast Monday through Saturday, Disp: , Rfl:    mirtazapine (REMERON) 15 MG tablet, Take 15 mg by mouth at bedtime., Disp: , Rfl:    Multiple Vitamins-Minerals (AIRBORNE PO), Take 2 tablets by mouth in the morning and at bedtime., Disp: , Rfl:    rosuvastatin (CRESTOR) 40 MG tablet, Take 40 mg by mouth at bedtime. , Disp: , Rfl:    sulfaSALAzine (AZULFIDINE) 500 MG tablet, Take 1,000 mg by mouth 2 (two) times daily., Disp: , Rfl:    TURMERIC PO, Take 1 capsule by mouth daily. , Disp: , Rfl:   Social History  Tobacco Use  Smoking Status Never  Smokeless Tobacco Never    No Known Allergies Objective:  There were no vitals filed for this visit. There is no height or weight on file to calculate BMI. Constitutional Well developed. Well nourished.  Vascular Dorsalis pedis pulses palpable bilaterally. Posterior tibial pulses palpable bilaterally. Capillary refill normal to all digits.  No cyanosis or clubbing noted. Pedal hair growth normal.  Neurologic Normal speech. Oriented to person, place, and time. Epicritic sensation to light touch grossly present bilaterally.  Dermatologic Nails well groomed and normal in appearance. No open wounds. No skin lesions.  Orthopedic: No further pain on palpation of right fourth digit.  No further pain with range of motion of the PIPJ joint as well as slightly at the metatarsophalangeal joint.  No deep  intra-articular pain noted at the MPJ of the fourth.  No extensor or flexor tendinitis noted.   Radiographs: 3 views of skeletally mature adult right foot: Right fourth digit proximal phalanx noted.  Callus formation noted.  The fracture and the capital fragment are within normal limits in terms of alignment and displacement. Assessment:   1. Closed fracture of phalanx of fourth toe     Plan:  Patient was evaluated and treated and all questions answered.  Right fourth digit proximal phalanx fracture Clinically healed.  At this time patient can likely begin transition to regular shoes.  I discussed with her as long as her pain has clinically resolved she does not need to worry about the fourth digit.  She can continue utilizing within normal limits.  She states understanding.  If any foot and ankle issues arise in future I asked her to come see me.  No follow-ups on file.

## 2021-07-12 ENCOUNTER — Ambulatory Visit: Payer: Medicare Other | Admitting: Podiatry

## 2021-08-30 ENCOUNTER — Ambulatory Visit: Payer: Medicare Other | Admitting: Podiatry

## 2021-08-30 ENCOUNTER — Other Ambulatory Visit: Payer: Self-pay

## 2021-08-30 DIAGNOSIS — M7671 Peroneal tendinitis, right leg: Secondary | ICD-10-CM

## 2021-09-02 ENCOUNTER — Encounter: Payer: Self-pay | Admitting: Podiatry

## 2021-09-02 NOTE — Progress Notes (Signed)
Subjective:  Patient ID: Kaitlin Kelley, female    DOB: 1950-08-26,  MRN: [id number]  Chief Complaint  Patient presents with   Numbness    71 y.o. female presents with the above complaint.  Patient presents with a new complaint of right lateral foot pain.  Patient said there is burning sensation on the side of the foot.  She feels like there is a band wrapped around her foot.  She states she has some neuropathy.  She stated feels like it swollen.  She has not seen anyone else prior to seeing me for this.  She wanted to get it evaluated.  She denies any other acute complaints.  She is a diabetic with last A1c of 9.2   Review of Systems: Negative except as noted in the HPI. Denies N/V/F/Ch.  Past Medical History:  Diagnosis Date   Arthritis    Chronic cough    Depression    Diabetes mellitus without complication (HCC)    Edema    LEGS/FEET   GERD (gastroesophageal reflux disease)    NO MEDS   Headache    H/O MIGRAINES   HOH (hard of hearing)    Hypertension    Hypothyroidism    IBS (irritable bowel syndrome)    Sleep apnea    CPAP    Current Outpatient Medications:    amLODipine (NORVASC) 5 MG tablet, Take 5 mg by mouth at bedtime. , Disp: , Rfl:    azelastine (ASTELIN) 0.1 % nasal spray, Place 1 spray into both nostrils as needed for allergies., Disp: , Rfl:    Bacillus Coagulans-Inulin (ALIGN PREBIOTIC-PROBIOTIC PO), Take 1 capsule by mouth daily., Disp: , Rfl:    Cholecalciferol 50 MCG (2000 UT) CAPS, Take 2,000 Units by mouth daily. , Disp: , Rfl:    Cyanocobalamin 3000 MCG CAPS, Take 3,000 mcg by mouth daily. , Disp: , Rfl:    dicyclomine (BENTYL) 10 MG capsule, Take 10 mg by mouth 4 (four) times daily as needed for spasms. , Disp: , Rfl:    dorzolamide (TRUSOPT) 2 % ophthalmic solution, Place 1 drop into both eyes in the morning and at bedtime. , Disp: , Rfl:    Dulaglutide (TRULICITY) 1.5 MG/0.5ML SOPN, Inject 0.5 mLs into the skin once a week. (Patient not taking:  Reported on 02/19/2020), Disp: , Rfl:    DULoxetine (CYMBALTA) 60 MG capsule, Take 60 mg by mouth 2 (two) times daily. , Disp: , Rfl:    gabapentin (NEURONTIN) 300 MG capsule, Take 300 mg by mouth 3 (three) times daily., Disp: , Rfl:    glipiZIDE (GLUCOTROL) 10 MG tablet, Take 10 mg by mouth in the morning and at bedtime. , Disp: , Rfl:    insulin detemir (LEVEMIR) 100 UNIT/ML injection, Inject 30 Units into the skin at bedtime. , Disp: , Rfl:    Insulin Glargine-Lixisenatide (SOLIQUA) 100-33 UNT-MCG/ML SOPN, Inject 30 Units into the skin at bedtime., Disp: , Rfl:    levothyroxine (SYNTHROID, LEVOTHROID) 75 MCG tablet, Take 75 mcg by mouth See admin instructions. Take 75 mcg by mouth daily before breakfast Monday through Saturday, Disp: , Rfl:    mirtazapine (REMERON) 15 MG tablet, Take 15 mg by mouth at bedtime., Disp: , Rfl:    Multiple Vitamins-Minerals (AIRBORNE PO), Take 2 tablets by mouth in the morning and at bedtime., Disp: , Rfl:    rosuvastatin (CRESTOR) 40 MG tablet, Take 40 mg by mouth at bedtime. , Disp: , Rfl:    sulfaSALAzine (AZULFIDINE) 500 MG  tablet, Take 1,000 mg by mouth 2 (two) times daily., Disp: , Rfl:    TURMERIC PO, Take 1 capsule by mouth daily. , Disp: , Rfl:   Social History   Tobacco Use  Smoking Status Never  Smokeless Tobacco Never    No Known Allergies Objective:  There were no vitals filed for this visit. There is no height or weight on file to calculate BMI. Constitutional Well developed. Well nourished.  Vascular Dorsalis pedis pulses palpable bilaterally. Posterior tibial pulses palpable bilaterally. Capillary refill normal to all digits.  No cyanosis or clubbing noted. Pedal hair growth normal.  Neurologic Normal speech. Oriented to person, place, and time. Epicritic sensation to light touch grossly present bilaterally.  Dermatologic Nails well groomed and normal in appearance. No open wounds. No skin lesions.  Orthopedic: Pain on palpation to  the lateral side of the foot.  Pain with resisted dorsiflexion eversion of the foot.  Pain along the course of the peroneal tendon including the insertion.  Foot type is cavovarus foot structure likely due to weakening of the peroneal tendons.   Radiographs: None Assessment:   1. Peroneal tendinitis, right    Plan:  Patient was evaluated and treated and all questions answered.  Right peroneal tendinitis with underlying cavovarus foot structure -All questions and concerns were discussed with the patient in extensive detail. -Given the amount of pain that she is having I believe patient will benefit from cam boot immobilization.  This will allow the foot to resume to regular position more rectus and take the stress of the peroneal tendon.  If there is any improvement we will discuss bracing and orthotics management in the future.  There may be a component of diabetes given that his uncontrolled playing a role in the neuropathy.  I discussed all of this with the patient she states understanding. -Cam boot was dispensed  No follow-ups on file.

## 2021-09-27 ENCOUNTER — Other Ambulatory Visit: Payer: Self-pay

## 2021-09-27 ENCOUNTER — Encounter: Payer: Self-pay | Admitting: Podiatry

## 2021-09-27 ENCOUNTER — Ambulatory Visit: Payer: Medicare Other | Admitting: Podiatry

## 2021-09-27 DIAGNOSIS — M7671 Peroneal tendinitis, right leg: Secondary | ICD-10-CM

## 2021-09-29 ENCOUNTER — Encounter: Payer: Self-pay | Admitting: Podiatry

## 2021-09-29 NOTE — Progress Notes (Signed)
?Subjective:  ?Patient ID: Kaitlin Kelley, female    DOB: 21-Jul-1950,  MRN: [id number] ? ?Chief Complaint  ?Patient presents with  ? Foot Pain  ?  "It's much better."  ? ? ?71 y.o. female presents with the above complaint.  Patient presents with a follow-up of right peroneal tendinitis.  She states she is getting a little bit better the cam boot definitely helped.  She would like to discuss the next treatment plan.  She wants to know if there is something like to be done to help transition this.  She denies any other issues she denies any other acute complaints.  She is a diabetic with last A1c of 9.2 ? ? ?Review of Systems: Negative except as noted in the HPI. Denies N/V/F/Ch. ? ?Past Medical History:  ?Diagnosis Date  ? Arthritis   ? Chronic cough   ? Depression   ? Diabetes mellitus without complication (HCC)   ? Edema   ? LEGS/FEET  ? GERD (gastroesophageal reflux disease)   ? NO MEDS  ? Headache   ? H/O MIGRAINES  ? HOH (hard of hearing)   ? Hypertension   ? Hypothyroidism   ? IBS (irritable bowel syndrome)   ? Sleep apnea   ? CPAP  ? ? ?Current Outpatient Medications:  ?  amLODipine (NORVASC) 5 MG tablet, Take 5 mg by mouth at bedtime. , Disp: , Rfl:  ?  azelastine (ASTELIN) 0.1 % nasal spray, Place 1 spray into both nostrils as needed for allergies., Disp: , Rfl:  ?  Bacillus Coagulans-Inulin (ALIGN PREBIOTIC-PROBIOTIC PO), Take 1 capsule by mouth daily., Disp: , Rfl:  ?  Cholecalciferol 50 MCG (2000 UT) CAPS, Take 2,000 Units by mouth daily. , Disp: , Rfl:  ?  Cyanocobalamin 3000 MCG CAPS, Take 3,000 mcg by mouth daily. , Disp: , Rfl:  ?  dicyclomine (BENTYL) 10 MG capsule, Take 10 mg by mouth 4 (four) times daily as needed for spasms. , Disp: , Rfl:  ?  dorzolamide (TRUSOPT) 2 % ophthalmic solution, Place 1 drop into both eyes in the morning and at bedtime. , Disp: , Rfl:  ?  Dulaglutide (TRULICITY) 1.5 MG/0.5ML SOPN, Inject 0.5 mLs into the skin once a week. (Patient not taking: Reported on 02/19/2020),  Disp: , Rfl:  ?  DULoxetine (CYMBALTA) 60 MG capsule, Take 60 mg by mouth 2 (two) times daily. , Disp: , Rfl:  ?  gabapentin (NEURONTIN) 300 MG capsule, Take 300 mg by mouth 3 (three) times daily., Disp: , Rfl:  ?  glipiZIDE (GLUCOTROL) 10 MG tablet, Take 10 mg by mouth in the morning and at bedtime. , Disp: , Rfl:  ?  insulin detemir (LEVEMIR) 100 UNIT/ML injection, Inject 30 Units into the skin at bedtime. , Disp: , Rfl:  ?  Insulin Glargine-Lixisenatide (SOLIQUA) 100-33 UNT-MCG/ML SOPN, Inject 30 Units into the skin at bedtime., Disp: , Rfl:  ?  levothyroxine (SYNTHROID, LEVOTHROID) 75 MCG tablet, Take 75 mcg by mouth See admin instructions. Take 75 mcg by mouth daily before breakfast Monday through Saturday, Disp: , Rfl:  ?  mirtazapine (REMERON) 15 MG tablet, Take 15 mg by mouth at bedtime., Disp: , Rfl:  ?  Multiple Vitamins-Minerals (AIRBORNE PO), Take 2 tablets by mouth in the morning and at bedtime., Disp: , Rfl:  ?  rosuvastatin (CRESTOR) 40 MG tablet, Take 40 mg by mouth at bedtime. , Disp: , Rfl:  ?  sulfaSALAzine (AZULFIDINE) 500 MG tablet, Take 1,000 mg by mouth 2 (  two) times daily., Disp: , Rfl:  ?  TURMERIC PO, Take 1 capsule by mouth daily. , Disp: , Rfl:  ? ?Social History  ? ?Tobacco Use  ?Smoking Status Never  ?Smokeless Tobacco Never  ? ? ?No Known Allergies ?Objective:  ?There were no vitals filed for this visit. ?There is no height or weight on file to calculate BMI. ?Constitutional Well developed. ?Well nourished.  ?Vascular Dorsalis pedis pulses palpable bilaterally. ?Posterior tibial pulses palpable bilaterally. ?Capillary refill normal to all digits.  ?No cyanosis or clubbing noted. ?Pedal hair growth normal.  ?Neurologic Normal speech. ?Oriented to person, place, and time. ?Epicritic sensation to light touch grossly present bilaterally.  ?Dermatologic Nails well groomed and normal in appearance. ?No open wounds. ?No skin lesions.  ?Orthopedic: Mild pain on palpation to the lateral side of  the foot.  Mild pain with resisted dorsiflexion eversion of the foot.  Mild pain along the course of the peroneal tendon including the insertion.  Foot type is cavovarus foot structure likely due to weakening of the peroneal tendons.  ? ?Radiographs: None ?Assessment:  ? ?1. Peroneal tendinitis, right   ? ? ?Plan:  ?Patient was evaluated and treated and all questions answered. ? ?Right peroneal tendinitis with underlying cavovarus foot structure ?-All questions and concerns were discussed with the patient in extensive detail. ?-Clinically at this time cam boot immobilization allow her to decrease her pain completely.  She still has some residual pain I believe she will benefit from Tri-Lock ankle brace to help transition to regular shoes.  If she is unable to do so we can discussed orthotics versus steroid injection during next clinical visit ? ?No follow-ups on file.  ?

## 2021-11-08 ENCOUNTER — Ambulatory Visit: Payer: Medicare Other | Admitting: Podiatry

## 2021-11-08 ENCOUNTER — Encounter: Payer: Self-pay | Admitting: Podiatry

## 2021-11-08 DIAGNOSIS — M7671 Peroneal tendinitis, right leg: Secondary | ICD-10-CM | POA: Diagnosis not present

## 2021-11-08 DIAGNOSIS — Q661 Congenital talipes calcaneovarus, unspecified foot: Secondary | ICD-10-CM | POA: Diagnosis not present

## 2021-11-08 NOTE — Progress Notes (Signed)
?Subjective:  ?Patient ID: Kaitlin Kelley, female    DOB: Sep 02, 1950,  MRN: [id number] ? ?Chief Complaint  ?Patient presents with  ? Foot Pain  ?  Right foot pain ?Pt stated that she is doing better   ? ? ?71 y.o. female presents with the above complaint.  Patient presents with a follow-up of right peroneal tendinitis.  She states it is getting a lot better and she no longer has any pain.  She is able to transition out of the boot into a shoe with a brace. ? ? ?Review of Systems: Negative except as noted in the HPI. Denies N/V/F/Ch. ? ?Past Medical History:  ?Diagnosis Date  ? Arthritis   ? Chronic cough   ? Depression   ? Diabetes mellitus without complication (HCC)   ? Edema   ? LEGS/FEET  ? GERD (gastroesophageal reflux disease)   ? NO MEDS  ? Headache   ? H/O MIGRAINES  ? HOH (hard of hearing)   ? Hypertension   ? Hypothyroidism   ? IBS (irritable bowel syndrome)   ? Sleep apnea   ? CPAP  ? ? ?Current Outpatient Medications:  ?  amLODipine (NORVASC) 5 MG tablet, Take 5 mg by mouth at bedtime. , Disp: , Rfl:  ?  azelastine (ASTELIN) 0.1 % nasal spray, Place 1 spray into both nostrils as needed for allergies., Disp: , Rfl:  ?  Bacillus Coagulans-Inulin (ALIGN PREBIOTIC-PROBIOTIC PO), Take 1 capsule by mouth daily., Disp: , Rfl:  ?  Cholecalciferol 50 MCG (2000 UT) CAPS, Take 2,000 Units by mouth daily. , Disp: , Rfl:  ?  Cyanocobalamin 3000 MCG CAPS, Take 3,000 mcg by mouth daily. , Disp: , Rfl:  ?  dicyclomine (BENTYL) 10 MG capsule, Take 10 mg by mouth 4 (four) times daily as needed for spasms. , Disp: , Rfl:  ?  dorzolamide (TRUSOPT) 2 % ophthalmic solution, Place 1 drop into both eyes in the morning and at bedtime. , Disp: , Rfl:  ?  Dulaglutide (TRULICITY) 1.5 MG/0.5ML SOPN, Inject 0.5 mLs into the skin once a week. (Patient not taking: Reported on 02/19/2020), Disp: , Rfl:  ?  DULoxetine (CYMBALTA) 60 MG capsule, Take 60 mg by mouth 2 (two) times daily. , Disp: , Rfl:  ?  gabapentin (NEURONTIN) 300 MG  capsule, Take 300 mg by mouth 3 (three) times daily., Disp: , Rfl:  ?  glipiZIDE (GLUCOTROL) 10 MG tablet, Take 10 mg by mouth in the morning and at bedtime. , Disp: , Rfl:  ?  insulin detemir (LEVEMIR) 100 UNIT/ML injection, Inject 30 Units into the skin at bedtime. , Disp: , Rfl:  ?  Insulin Glargine-Lixisenatide (SOLIQUA) 100-33 UNT-MCG/ML SOPN, Inject 30 Units into the skin at bedtime., Disp: , Rfl:  ?  levothyroxine (SYNTHROID, LEVOTHROID) 75 MCG tablet, Take 75 mcg by mouth See admin instructions. Take 75 mcg by mouth daily before breakfast Monday through Saturday, Disp: , Rfl:  ?  mirtazapine (REMERON) 15 MG tablet, Take 15 mg by mouth at bedtime., Disp: , Rfl:  ?  Multiple Vitamins-Minerals (AIRBORNE PO), Take 2 tablets by mouth in the morning and at bedtime., Disp: , Rfl:  ?  rosuvastatin (CRESTOR) 40 MG tablet, Take 40 mg by mouth at bedtime. , Disp: , Rfl:  ?  sulfaSALAzine (AZULFIDINE) 500 MG tablet, Take 1,000 mg by mouth 2 (two) times daily., Disp: , Rfl:  ?  TURMERIC PO, Take 1 capsule by mouth daily. , Disp: , Rfl:  ? ?Social  History  ? ?Tobacco Use  ?Smoking Status Never  ?Smokeless Tobacco Never  ? ? ?No Known Allergies ?Objective:  ?There were no vitals filed for this visit. ?There is no height or weight on file to calculate BMI. ?Constitutional Well developed. ?Well nourished.  ?Vascular Dorsalis pedis pulses palpable bilaterally. ?Posterior tibial pulses palpable bilaterally. ?Capillary refill normal to all digits.  ?No cyanosis or clubbing noted. ?Pedal hair growth normal.  ?Neurologic Normal speech. ?Oriented to person, place, and time. ?Epicritic sensation to light touch grossly present bilaterally.  ?Dermatologic Nails well groomed and normal in appearance. ?No open wounds. ?No skin lesions.  ?Orthopedic: Mild pain on palpation to the lateral side of the foot.  Mild pain with resisted dorsiflexion eversion of the foot.  Mild pain along the course of the peroneal tendon including the  insertion.  Foot type is cavovarus foot structure likely due to weakening of the peroneal tendons.  ? ?Radiographs: None ?Assessment:  ? ?1. Peroneal tendinitis, right   ?2. Cavovarus deformity of foot   ? ? ? ?Plan:  ?Patient was evaluated and treated and all questions answered. ? ?Right peroneal tendinitis with underlying cavovarus foot structure ?-All questions and concerns were discussed with the patient in extensive detail. ?-Clinically she is doing a lot better with her ankle brace.  She states the foot is not turning inwards as much.  However given the cavovarus foot structure she will ultimately benefit from [place] brace and bracing.  The Tri-Lock brace is helping her but it will eventually wear out.  I discussed this with patient she states understand would like to obtain a brace ?-She will be scheduled to see Rushie Chestnut [place] brace ? ?No follow-ups on file.  ?

## 2021-11-25 ENCOUNTER — Ambulatory Visit: Payer: Medicare Other

## 2021-11-25 DIAGNOSIS — M7671 Peroneal tendinitis, right leg: Secondary | ICD-10-CM

## 2021-11-25 DIAGNOSIS — Q661 Congenital talipes calcaneovarus, unspecified foot: Secondary | ICD-10-CM

## 2021-11-25 NOTE — Progress Notes (Signed)
Discussed [place] AFO with patient and patient reports she is doing significantly better with her current tri-lock brace and would prefer to just use it rather than move forward with a stiff device with a plastic core. Plan of care to resume at patient's request.

## 2022-03-21 ENCOUNTER — Other Ambulatory Visit: Payer: Self-pay | Admitting: Pulmonary Disease

## 2022-03-21 DIAGNOSIS — D860 Sarcoidosis of lung: Secondary | ICD-10-CM

## 2022-03-31 ENCOUNTER — Other Ambulatory Visit: Payer: Medicare Other

## 2022-04-04 ENCOUNTER — Ambulatory Visit
Admission: RE | Admit: 2022-04-04 | Discharge: 2022-04-04 | Disposition: A | Discharge status: Home / Self Care | Payer: Medicare Other | Source: Ambulatory Visit | Attending: Pulmonary Disease | Admitting: Pulmonary Disease

## 2022-04-04 DIAGNOSIS — D860 Sarcoidosis of lung: Secondary | ICD-10-CM

## 2022-04-04 MED ORDER — IOPAMIDOL (ISOVUE-300) INJECTION 61%
75.0000 mL | Freq: Once | INTRAVENOUS | Status: AC | PRN
  Administered 2022-04-04: 75 mL via INTRAVENOUS

## 2022-07-24 ENCOUNTER — Encounter: Payer: Medicare Other | Admitting: *Deleted

## 2022-07-24 ENCOUNTER — Other Ambulatory Visit: Payer: Self-pay

## 2022-07-24 ENCOUNTER — Encounter: Payer: Medicare Other | Attending: Pulmonary Disease

## 2022-07-24 VITALS — Ht 66.0 in | Wt 220.9 lb

## 2022-07-24 DIAGNOSIS — D860 Sarcoidosis of lung: Secondary | ICD-10-CM

## 2022-07-24 NOTE — Progress Notes (Signed)
Pulmonary Individual Treatment Plan  Patient Details  Name: Kaitlin Kelley MRN: [id number] Date of Birth: 29-Dec-1950 Referring Provider:   Flowsheet Row Pulmonary Rehab from 07/24/2022 in [facility] Cardiac and Pulmonary Rehab  Referring Provider Lanney Gins       Initial Encounter Date:  Flowsheet Row Pulmonary Rehab from 07/24/2022 in 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 , Disp: , Rfl:    azelastine (ASTELIN) 0.1 % nasal spray, Place 1 spray into both nostrils as needed for allergies., Disp: , Rfl:    Bacillus Coagulans-Inulin (ALIGN PREBIOTIC-PROBIOTIC PO), Take 1 capsule by mouth daily., Disp: , Rfl:    calcium-vitamin D (OSCAL WITH D) 500-5 MG-MCG tablet, Take 1 tablet by mouth. 1083m, Disp: , Rfl:    Cholecalciferol 50 MCG (2000 UT) CAPS, Take 2,000 Units by mouth daily. , Disp: , Rfl:    Collagen-Vitamin C-Biotin (COLLAGEN 1500/C) 500-50-0.8 MG CAPS, daily., Disp: , Rfl:    cyanocobalamin 100 MCG tablet, Take by mouth., Disp: , Rfl:    Cyanocobalamin 3000 MCG CAPS, Take 3,000 mcg by mouth daily. , Disp: , Rfl:    dicyclomine (BENTYL) 10 MG capsule, Take 10 mg by mouth 4 (four) times daily as needed for spasms. , Disp: , Rfl:    dorzolamide (TRUSOPT) 2 % ophthalmic solution, Place 1 drop into both eyes in the morning and at bedtime. , Disp: , Rfl:    Dulaglutide (TRULICITY) 1.5 M0000000SOPN, Inject 0.5 mLs into the skin once a week. (Patient not taking: Reported on 02/19/2020), Disp: , Rfl:    DULoxetine (CYMBALTA) 60 MG capsule, Take 60 mg by mouth 2 (two) times daily. , Disp: , Rfl:    ferrous gluconate (FERGON) 240 (27 FE) MG tablet, Take by mouth., Disp: , Rfl:    folic acid (FOLVITE) 1 MG tablet, Take 1 mg by mouth daily., Disp: , Rfl:    gabapentin (NEURONTIN) 300 MG capsule,  Take 300 mg by mouth 3 (three) times daily., Disp: , Rfl:    glipiZIDE (GLUCOTROL) 10 MG tablet, Take 10 mg by mouth in the morning and at bedtime. , Disp: , Rfl:    insulin detemir (LEVEMIR) 100 UNIT/ML injection, Inject 30 Units into the skin at bedtime. , Disp: , Rfl:    Insulin Glargine-Lixisenatide (SOLIQUA) 100-33 UNT-MCG/ML SOPN, Inject 30 Units into the skin at bedtime., Disp: , Rfl:    Lactobacillus 0.05-0.05 MG TABS, Take by mouth., Disp: , Rfl:    Lancets 28G MISC, use to check blood sugar three to four times a day or as directed (Patient not taking: Reported on 07/24/2022), Disp: , Rfl:    levothyroxine (SYNTHROID) 75 MCG tablet, Take 1 tablet by mouth daily. (Patient not taking: Reported on 07/24/2022), Disp: , Rfl:    levothyroxine (SYNTHROID, LEVOTHROID) 75 MCG tablet, Take 75 mcg by mouth See admin instructions. Take 75 mcg by mouth daily before breakfast Monday through Saturday (Patient not taking: Reported on 07/24/2022), Disp: , Rfl:    losartan (COZAAR) 50 MG tablet, Take 50 mg by mouth daily., Disp: , Rfl:    methotrexate (RHEUMATREX) 2.5 MG tablet, TAKE 3 TABLETS (7.5 MG TOTAL) BY MOUTH EVERY 7 (SEVEN) DAYS, Disp: , Rfl:    mirtazapine (REMERON) 15 MG tablet, Take 15 mg by mouth at bedtime., Disp: ,  Rfl:    modafinil (PROVIGIL) 100 MG tablet, Take by mouth., Disp: , Rfl:    Multiple Vitamin tablet, Take 1 tablet by mouth daily., Disp: , Rfl:    Multiple Vitamins-Minerals (AIRBORNE PO), Take 2 tablets by mouth in the morning and at bedtime., Disp: , Rfl:    rosuvastatin (CRESTOR) 40 MG tablet, Take 40 mg by mouth at bedtime.  (Patient not taking: Reported on 07/24/2022), Disp: , Rfl:    rosuvastatin (CRESTOR) 40 MG tablet, Take 1 tablet by mouth daily., Disp: , Rfl:    sulfaSALAzine (AZULFIDINE) 500 MG tablet, Take 1,000 mg by mouth 2 (two) times daily., Disp: , Rfl:    sulfaSALAzine (AZULFIDINE) 500 MG tablet, Take 2 tablets by mouth 2 (two) times daily. (Patient not taking:  Reported on 07/24/2022), Disp: , Rfl:    TURMERIC PO, Take 1 capsule by mouth daily. , Disp: , Rfl:   Past Medical History: Past Medical History:  Diagnosis Date   Arthritis    Chronic cough    Depression    Diabetes mellitus without complication (HCC)    Edema    LEGS/FEET   GERD (gastroesophageal reflux disease)    NO MEDS   Headache    H/O MIGRAINES   HOH (hard of hearing)    Hypertension    Hypothyroidism    IBS (irritable bowel syndrome)    Sleep apnea    CPAP    Tobacco Use: Social History   Tobacco Use  Smoking Status Never  Smokeless Tobacco Never    Labs: Review Flowsheet       Latest Ref Rng & Units 02/25/2018  Labs for ITP Cardiac and Pulmonary Rehab  Hemoglobin A1c 4.8 - 5.6 % 9.2      Pulmonary Assessment Scores:  Pulmonary Assessment Scores     Row Name 07/24/22 1652         ADL UCSD   ADL Phase Entry     SOB Score total 56     Rest 0     Walk 1     Stairs 2     Bath 0     Dress 1     Shop 5       CAT Score   CAT Score 18       mMRC Score   mMRC Score 2              UCSD: Self-administered rating of dyspnea associated with activities of daily living (ADLs) 6-point scale (0 = "not at all" to 5 = "maximal or unable to do because of breathlessness")  Scoring Scores range from 0 to 120.  Minimally important difference is 5 units  CAT: CAT can identify the health impairment of COPD patients and is better correlated with disease progression.  CAT has a scoring range of zero to 40. The CAT score is classified into four groups of low (less than 10), medium (10 - 20), high (21-30) and very high (31-40) based on the impact level of disease on health status. A CAT score over 10 suggests significant symptoms.  A worsening CAT score could be explained by an exacerbation, poor medication adherence, poor inhaler technique, or progression of COPD or comorbid conditions.  CAT MCID is 2 points  mMRC: mMRC (Modified Medical Research Council)  Dyspnea Scale is used to assess the degree of baseline functional disability in patients of respiratory disease due to dyspnea. No minimal important difference is established. A decrease in score of 1 point or  greater is considered a positive change.   Pulmonary Function Assessment:  Pulmonary Function Assessment - 07/24/22 1029       Breath   Shortness of Breath Yes;Limiting activity             Exercise Target Goals: Exercise Program Goal: Individual exercise prescription set using results from initial 6 min walk test and THRR while considering  patient's activity barriers and safety.   Exercise Prescription Goal: Initial exercise prescription builds to 30-45 minutes a day of aerobic activity, 2-3 days per week.  Home exercise guidelines will be given to patient during program as part of exercise prescription that the participant will acknowledge.  Education: Aerobic Exercise: - Group verbal and visual presentation on the components of exercise prescription. Introduces F.I.T.T principle from ACSM for exercise prescriptions.  Reviews F.I.T.T. principles of aerobic exercise including progression. Written material given at graduation.   Education: Resistance Exercise: - Group verbal and visual presentation on the components of exercise prescription. Introduces F.I.T.T principle from ACSM for exercise prescriptions  Reviews F.I.T.T. principles of resistance exercise including progression. Written material given at graduation.    Education: Exercise & Equipment Safety: - Individual verbal instruction and demonstration of equipment use and safety with use of the equipment. Flowsheet Row Pulmonary Rehab from 07/24/2022 in [facility] Cardiac and Pulmonary Rehab  Date 07/24/22  Educator [facility]  Instruction Review Code 1- Verbalizes Understanding       Education: Exercise Physiology & General Exercise Guidelines: - Group verbal and written instruction with models to review the exercise  physiology of the cardiovascular system and associated critical values. Provides general exercise guidelines with specific guidelines to those with heart or lung disease.    Education: Flexibility, Balance, Mind/Body Relaxation: - Group verbal and visual presentation with interactive activity on the components of exercise prescription. Introduces F.I.T.T principle from ACSM for exercise prescriptions. Reviews F.I.T.T. principles of flexibility and balance exercise training including progression. Also discusses the mind body connection.  Reviews various relaxation techniques to help reduce and manage stress (i.e. Deep breathing, progressive muscle relaxation, and visualization). Balance handout provided to take home. Written material given at graduation.   Activity Barriers & Risk Stratification:  Activity Barriers & Cardiac Risk Stratification - 07/24/22 1643       Activity Barriers & Cardiac Risk Stratification   Activity Barriers Back Problems;Neck/Spine Problems             6 Minute Walk:  6 Minute Walk     Row Name 07/24/22 1640         6 Minute Walk   Phase Initial     Distance 1215 feet     Walk Time 6 minutes     # of Rest Breaks 0     MPH 2.3     METS 2.15     RPE 13     Perceived Dyspnea  1     VO2 Peak 7.54     Symptoms No     Resting HR 73 bpm     Resting BP 132/82     Resting Oxygen Saturation  96 %     Exercise Oxygen Saturation  during 6 min walk 93 %     Max Ex. HR 98 bpm     Max Ex. BP 140/80     2 Minute Post BP 128/70       Interval HR   1 Minute HR 81     2 Minute HR 95     3  Minute HR 98     4 Minute HR 97     5 Minute HR 97     6 Minute HR 95     2 Minute Post HR 69     Interval Heart Rate? Yes       Interval Oxygen   Interval Oxygen? Yes     Baseline Oxygen Saturation % 96 %     1 Minute Oxygen Saturation % 95 %     1 Minute Liters of Oxygen 0 L     2 Minute Oxygen Saturation % 93 %     2 Minute Liters of Oxygen 0 L     3 Minute  Oxygen Saturation % 95 %     3 Minute Liters of Oxygen 0 L     4 Minute Oxygen Saturation % 96 %     4 Minute Liters of Oxygen 0 L     5 Minute Oxygen Saturation % 95 %     5 Minute Liters of Oxygen 0 L     6 Minute Oxygen Saturation % 96 %     6 Minute Liters of Oxygen 0 L     2 Minute Post Oxygen Saturation % 95 %     2 Minute Post Liters of Oxygen 0 L             Oxygen Initial Assessment:  Oxygen Initial Assessment - 07/24/22 1655       Home Oxygen   Home Oxygen Device None    Sleep Oxygen Prescription CPAP    Home Exercise Oxygen Prescription None    Home Resting Oxygen Prescription None    Compliance with Home Oxygen Use Yes      Initial 6 min Walk   Oxygen Used None      Program Oxygen Prescription   Program Oxygen Prescription None      Intervention   Short Term Goals To learn and exhibit compliance with exercise, home and travel O2 prescription;To learn and understand importance of maintaining oxygen saturations>88%;To learn and demonstrate proper use of respiratory medications;To learn and demonstrate proper pursed lip breathing techniques or other breathing techniques. ;To learn and understand importance of monitoring SPO2 with pulse oximeter and demonstrate accurate use of the pulse oximeter.    Long  Term Goals Exhibits compliance with exercise, home  and travel O2 prescription;Verbalizes importance of monitoring SPO2 with pulse oximeter and return demonstration;Exhibits proper breathing techniques, such as pursed lip breathing or other method taught during program session;Maintenance of O2 saturations>88%;Compliance with respiratory medication;Demonstrates proper use of MDI's             Oxygen Re-Evaluation:   Oxygen Discharge (Final Oxygen Re-Evaluation):   Initial Exercise Prescription:  Initial Exercise Prescription - 07/24/22 1600       Date of Initial Exercise RX and Referring Provider   Date 07/24/22    Referring Provider Aleskerov       Oxygen   Maintain Oxygen Saturation 88% or higher      Treadmill   MPH 1.5    Grade 1    Minutes 15    METs 2.35      NuStep   Level 2    SPM 80    Minutes 15    METs 2.15      REL-XR   Level 2    Speed 50    Minutes 15    METs 2.15      Biostep-RELP   Level 2  SPM 50    Minutes 15    METs 2.15      Intensity   THRR 40-80% of Max Heartrate 102-132    Ratings of Perceived Exertion 11-13    Perceived Dyspnea 0-4      Progression   Progression Continue to progress workloads to maintain intensity without signs/symptoms of physical distress.      Resistance Training   Training Prescription Yes    Weight 2    Reps 10-15             Perform Capillary Blood Glucose checks as needed.  Exercise Prescription Changes:   Exercise Prescription Changes     Row Name 07/24/22 1600             Response to Exercise   Blood Pressure (Admit) 132/82       Blood Pressure (Exercise) 140/80       Blood Pressure (Exit) 128/70       Heart Rate (Admit) 73 bpm       Heart Rate (Exercise) 98 bpm       Heart Rate (Exit) 69 bpm       Oxygen Saturation (Admit) 96 %       Oxygen Saturation (Exercise) 93 %       Oxygen Saturation (Exit) 95 %       Rating of Perceived Exertion (Exercise) 13       Perceived Dyspnea (Exercise) 1       Symptoms 3/10 left hip pain       Comments 6 MWT results                Exercise Comments:   Exercise Goals and Review:   Exercise Goals     Row Name 07/24/22 1647             Exercise Goals   Increase Physical Activity Yes       Intervention Provide advice, education, support and counseling about physical activity/exercise needs.;Develop an individualized exercise prescription for aerobic and resistive training based on initial evaluation findings, risk stratification, comorbidities and participant's personal goals.       Expected Outcomes Short Term: Attend rehab on a regular basis to increase amount of physical  activity.;Long Term: Add in home exercise to make exercise part of routine and to increase amount of physical activity.;Long Term: Exercising regularly at least 3-5 days a week.       Increase Strength and Stamina Yes       Intervention Provide advice, education, support and counseling about physical activity/exercise needs.;Develop an individualized exercise prescription for aerobic and resistive training based on initial evaluation findings, risk stratification, comorbidities and participant's personal goals.       Expected Outcomes Short Term: Increase workloads from initial exercise prescription for resistance, speed, and METs.;Short Term: Perform resistance training exercises routinely during rehab and add in resistance training at home;Long Term: Improve cardiorespiratory fitness, muscular endurance and strength as measured by increased METs and functional capacity ( )       Able to understand and use rate of perceived exertion (RPE) scale Yes       Intervention Provide education and explanation on how to use RPE scale       Expected Outcomes Short Term: Able to use RPE daily in rehab to express subjective intensity level;Long Term:  Able to use RPE to guide intensity level when exercising independently       Able to understand and use Dyspnea scale Yes  Intervention Provide education and explanation on how to use Dyspnea scale       Expected Outcomes Short Term: Able to use Dyspnea scale daily in rehab to express subjective sense of shortness of breath during exertion;Long Term: Able to use Dyspnea scale to guide intensity level when exercising independently       Knowledge and understanding of Target Heart Rate Range (THRR) Yes       Intervention Provide education and explanation of THRR including how the numbers were predicted and where they are located for reference       Expected Outcomes Short Term: Able to state/look up THRR;Long Term: Able to use THRR to govern intensity when  exercising independently;Short Term: Able to use daily as guideline for intensity in rehab       Able to check pulse independently Yes       Intervention Provide education and demonstration on how to check pulse in carotid and radial arteries.;Review the importance of being able to check your own pulse for safety during independent exercise       Expected Outcomes Short Term: Able to explain why pulse checking is important during independent exercise;Long Term: Able to check pulse independently and accurately       Understanding of Exercise Prescription Yes       Intervention Provide education, explanation, and written materials on patient's individual exercise prescription       Expected Outcomes Short Term: Able to explain program exercise prescription;Long Term: Able to explain home exercise prescription to exercise independently                Exercise Goals Re-Evaluation :   Discharge Exercise Prescription (Final Exercise Prescription Changes):  Exercise Prescription Changes - 07/24/22 1600       Response to Exercise   Blood Pressure (Admit) 132/82    Blood Pressure (Exercise) 140/80    Blood Pressure (Exit) 128/70    Heart Rate (Admit) 73 bpm    Heart Rate (Exercise) 98 bpm    Heart Rate (Exit) 69 bpm    Oxygen Saturation (Admit) 96 %    Oxygen Saturation (Exercise) 93 %    Oxygen Saturation (Exit) 95 %    Rating of Perceived Exertion (Exercise) 13    Perceived Dyspnea (Exercise) 1    Symptoms 3/10 left hip pain    Comments 6 MWT results             Nutrition:  Target Goals: Understanding of nutrition guidelines, daily intake of sodium 1500mg , cholesterol 200mg , calories 30% from fat and 7% or less from saturated fats, daily to have 5 or more servings of fruits and vegetables.  Education: All About Nutrition: -Group instruction provided by verbal, written material, interactive activities, discussions, models, and posters to present general guidelines for heart  healthy nutrition including fat, fiber, MyPlate, the role of sodium in heart healthy nutrition, utilization of the nutrition label, and utilization of this knowledge for meal planning. Follow up email sent as well. Written material given at graduation.   Biometrics:  Pre Biometrics - 07/24/22 1647       Pre Biometrics   Height 5\' 6"  (1.676 m)    Weight 220 lb 14.4 oz (100.2 kg)    Waist Circumference 42 inches    Hip Circumference 49 inches    Waist to Hip Ratio 0.86 %    BMI (Calculated) 35.67    Single Leg Stand 1.75 seconds  Nutrition Therapy Plan and Nutrition Goals:  Nutrition Therapy & Goals - 07/24/22 1649       Intervention Plan   Intervention Prescribe, educate and counsel regarding individualized specific dietary modifications aiming towards targeted core components such as weight, hypertension, lipid management, diabetes, heart failure and other comorbidities.    Expected Outcomes Short Term Goal: Understand basic principles of dietary content, such as calories, fat, sodium, cholesterol and nutrients.;Short Term Goal: A plan has been developed with personal nutrition goals set during dietitian appointment.;Long Term Goal: Adherence to prescribed nutrition plan.             Nutrition Assessments:  MEDIFICTS Score Key: ?70 Need to make dietary changes  40-70 Heart Healthy Diet ? 40 Therapeutic Level Cholesterol Diet  Flowsheet Row Pulmonary Rehab from 07/24/2022 in [facility] Cardiac and Pulmonary Rehab  Picture Your Plate Total Score on Admission 49      Picture Your Plate Scores: <44 Unhealthy dietary pattern with much room for improvement. 41-50 Dietary pattern unlikely to meet recommendations for good health and room for improvement. 51-60 More healthful dietary pattern, with some room for improvement.  >60 Healthy dietary pattern, although there may be some specific behaviors that could be improved.   Nutrition Goals  Re-Evaluation:   Nutrition Goals Discharge (Final Nutrition Goals Re-Evaluation):   Psychosocial: Target Goals: Acknowledge presence or absence of significant depression and/or stress, maximize coping skills, provide positive support system. Participant is able to verbalize types and ability to use techniques and skills needed for reducing stress and depression.   Education: Stress, Anxiety, and Depression - Group verbal and visual presentation to define topics covered.  Reviews how body is impacted by stress, anxiety, and depression.  Also discusses healthy ways to reduce stress and to treat/manage anxiety and depression.  Written material given at graduation.   Education: Sleep Hygiene -Provides group verbal and written instruction about how sleep can affect your health.  Define sleep hygiene, discuss sleep cycles and impact of sleep habits. Review good sleep hygiene tips.    Initial Review & Psychosocial Screening:  Initial Psych Review & Screening - 07/24/22 1031       Initial Review   Current issues with Current Anxiety/Panic;Current Psychotropic Meds;Current Depression;History of Depression;Current Sleep Concerns      Family Dynamics   Good Support System? Yes    Comments She has lost her husband two years ago. She stays up late and has been very tired.  Her son lives with her and is a good support system for her. From time to time she will miss her husband and feel depressed.      Barriers   Psychosocial barriers to participate in program The patient should benefit from training in stress management and relaxation.      Screening Interventions   Interventions Encouraged to exercise;To provide support and resources with identified psychosocial needs;Provide feedback about the scores to participant    Expected Outcomes Short Term goal: Utilizing psychosocial counselor, staff and physician to assist with identification of specific Stressors or current issues interfering with  healing process. Setting desired goal for each stressor or current issue identified.;Long Term Goal: Stressors or current issues are controlled or eliminated.;Short Term goal: Identification and review with participant of any Quality of Life or Depression concerns found by scoring the questionnaire.;Long Term goal: The participant improves quality of Life and PHQ9 Scores as seen by post scores and/or verbalization of changes  Quality of Life Scores:  Scores of 19 and below usually indicate a poorer quality of life in these areas.  A difference of  2-3 points is a clinically meaningful difference.  A difference of 2-3 points in the total score of the Quality of Life Index has been associated with significant improvement in overall quality of life, self-image, physical symptoms, and general health in studies assessing change in quality of life.  PHQ-9: Review Flowsheet       07/24/2022  Depression screen PHQ 2/9  Decreased Interest 3  Down, Depressed, Hopeless 1  PHQ - 2 Score 4  Altered sleeping 3  Tired, decreased energy 3  Change in appetite 2  Feeling bad or failure about yourself  2  Trouble concentrating 3  Moving slowly or fidgety/restless 1  Suicidal thoughts 0  PHQ-9 Score 18  Difficult doing work/chores Very difficult   Interpretation of Total Score  Total Score Depression Severity:  1-4 = Minimal depression, 5-9 = Mild depression, 10-14 = Moderate depression, 15-19 = Moderately severe depression, 20-27 = Severe depression   Psychosocial Evaluation and Intervention:  Psychosocial Evaluation - 07/24/22 1033       Psychosocial Evaluation & Interventions   Interventions Encouraged to exercise with the program and follow exercise prescription;Relaxation education;Stress management education    Comments She has lost her husband two years ago. She stays up late and has been very tired. Her son lives with her and is a good support system for her. From time to time  she will miss her husband and feel depressed.    Expected Outcomes Short: Start [facility] to help with mood. Long: Maintain a healthy mental state.    Continue Psychosocial Services  Follow up required by staff             Psychosocial Re-Evaluation:   Psychosocial Discharge (Final Psychosocial Re-Evaluation):   Education: Education Goals: Education classes will be provided on a weekly basis, covering required topics. Participant will state understanding/return demonstration of topics presented.  Learning Barriers/Preferences:  Learning Barriers/Preferences - 07/24/22 1030       Learning Barriers/Preferences   Learning Barriers None    Learning Preferences None             General Pulmonary Education Topics:  Infection Prevention: - Provides verbal and written material to individual with discussion of infection control including proper hand washing and proper equipment cleaning during exercise session. Flowsheet Row Pulmonary Rehab from 07/24/2022 in [facility] Cardiac and Pulmonary Rehab  Date 07/24/22  Educator [facility]  Instruction Review Code 1- Verbalizes Understanding       Falls Prevention: - Provides verbal and written material to individual with discussion of falls prevention and safety. Flowsheet Row Pulmonary Rehab from 07/24/2022 in [facility] Cardiac and Pulmonary Rehab  Date 07/24/22  Educator [facility]  Instruction Review Code 1- Verbalizes Understanding       Chronic Lung Disease Review: - Group verbal instruction with posters, models, PowerPoint presentations and videos,  to review new updates, new respiratory medications, new advancements in procedures and treatments. Providing information on websites and "800" numbers for continued self-education. Includes information about supplement oxygen, available portable oxygen systems, continuous and intermittent flow rates, oxygen safety, concentrators, and Medicare reimbursement for oxygen. Explanation of Pulmonary Drugs,  including class, frequency, complications, importance of spacers, rinsing mouth after steroid MDI's, and proper cleaning methods for nebulizers. Review of basic lung anatomy and physiology related to function, structure, and complications of lung disease. Review of risk factors. Discussion  about methods for diagnosing sleep apnea and types of masks and machines for OSA. Includes a review of the use of types of environmental controls: home humidity, furnaces, filters, dust mite/pet prevention, HEPA vacuums. Discussion about weather changes, air quality and the benefits of nasal washing. Instruction on Warning signs, infection symptoms, calling MD promptly, preventive modes, and value of vaccinations. Review of effective airway clearance, coughing and/or vibration techniques. Emphasizing that all should Create an Action Plan. Written material given at graduation. Flowsheet Row Pulmonary Rehab from 07/24/2022 in [facility] Cardiac and Pulmonary Rehab  Education need identified 07/24/22       AED/CPR: - Group verbal and written instruction with the use of models to demonstrate the basic use of the AED with the basic ABC's of resuscitation.    Anatomy and Cardiac Procedures: - Group verbal and visual presentation and models provide information about basic cardiac anatomy and function. Reviews the testing methods done to diagnose heart disease and the outcomes of the test results. Describes the treatment choices: Medical Management, Angioplasty, or Coronary Bypass Surgery for treating various heart conditions including Myocardial Infarction, Angina, Valve Disease, and Cardiac Arrhythmias.  Written material given at graduation.   Medication Safety: - Group verbal and visual instruction to review commonly prescribed medications for heart and lung disease. Reviews the medication, class of the drug, and side effects. Includes the steps to properly store meds and maintain the prescription regimen.  Written material  given at graduation.   Other: -Provides group and verbal instruction on various topics (see comments)   Knowledge Questionnaire Score:  Knowledge Questionnaire Score - 07/24/22 1648       Knowledge Questionnaire Score   Pre Score 14/18              Core Components/Risk Factors/Patient Goals at Admission:  Personal Goals and Risk Factors at Admission - 07/24/22 1650       Core Components/Risk Factors/Patient Goals on Admission    Weight Management Yes;Weight Loss    Intervention Weight Management: Develop a combined nutrition and exercise program designed to reach desired caloric intake, while maintaining appropriate intake of nutrient and fiber, sodium and fats, and appropriate energy expenditure required for the weight goal.;Weight Management: Provide education and appropriate resources to help participant work on and attain dietary goals.;Obesity: Provide education and appropriate resources to help participant work on and attain dietary goals.;Weight Management/Obesity: Establish reasonable short term and long term weight goals.    Admit Weight 220 lb 14.4 oz (100.2 kg)    Goal Weight: Short Term 210 lb (95.3 kg)    Goal Weight: Long Term 200 lb (90.7 kg)    Expected Outcomes Short Term: Continue to assess and modify interventions until short term weight is achieved;Long Term: Adherence to nutrition and physical activity/exercise program aimed toward attainment of established weight goal;Weight Loss: Understanding of general recommendations for a balanced deficit meal plan, which promotes 1-2 lb weight loss per week and includes a negative energy balance of [phone] kcal/d;Understanding recommendations for meals to include 15-35% energy as protein, 25-35% energy from fat, 35-60% energy from carbohydrates, less than 200mg  of dietary cholesterol, 20-35 gm of total fiber daily;Understanding of distribution of calorie intake throughout the day with the consumption of 4-5 meals/snacks     Improve shortness of breath with ADL's Yes    Intervention Provide education, individualized exercise plan and daily activity instruction to help decrease symptoms of SOB with activities of daily living.    Expected Outcomes Short Term: Improve cardiorespiratory fitness  to achieve a reduction of symptoms when performing ADLs;Long Term: Be able to perform more ADLs without symptoms or delay the onset of symptoms    Diabetes Yes    Intervention Provide education about signs/symptoms and action to take for hypo/hyperglycemia.;Provide education about proper nutrition, including hydration, and aerobic/resistive exercise prescription along with prescribed medications to achieve blood glucose in normal ranges: Fasting glucose 65-99 mg/dL    Expected Outcomes Short Term: Participant verbalizes understanding of the signs/symptoms and immediate care of hyper/hypoglycemia, proper foot care and importance of medication, aerobic/resistive exercise and nutrition plan for blood glucose control.;Long Term: Attainment of HbA1C < 7%.    Hypertension Yes    Intervention Provide education on lifestyle modifcations including regular physical activity/exercise, weight management, moderate sodium restriction and increased consumption of fresh fruit, vegetables, and low fat dairy, alcohol moderation, and smoking cessation.;Monitor prescription use compliance.    Expected Outcomes Short Term: Continued assessment and intervention until BP is < 140/92mm HG in hypertensive participants. < 130/74mm HG in hypertensive participants with diabetes, heart failure or chronic kidney disease.;Long Term: Maintenance of blood pressure at goal levels.    Lipids Yes    Intervention Provide education and support for participant on nutrition & aerobic/resistive exercise along with prescribed medications to achieve LDL 70mg , HDL >40mg .    Expected Outcomes Short Term: Participant states understanding of desired cholesterol values and is compliant  with medications prescribed. Participant is following exercise prescription and nutrition guidelines.;Long Term: Cholesterol controlled with medications as prescribed, with individualized exercise RX and with personalized nutrition plan. Value goals: LDL < 70mg , HDL > 40 mg.             Education:Diabetes - Individual verbal and written instruction to review signs/symptoms of diabetes, desired ranges of glucose level fasting, after meals and with exercise. Acknowledge that pre and post exercise glucose checks will be done for 3 sessions at entry of program. Flowsheet Row Pulmonary Rehab from 07/24/2022 in [facility] Cardiac and Pulmonary Rehab  Date 07/24/22  Educator [facility]  Instruction Review Code 1- Verbalizes Understanding       Know Your Numbers and Heart Failure: - Group verbal and visual instruction to discuss disease risk factors for cardiac and pulmonary disease and treatment options.  Reviews associated critical values for Overweight/Obesity, Hypertension, Cholesterol, and Diabetes.  Discusses basics of heart failure: signs/symptoms and treatments.  Introduces Heart Failure Zone chart for action plan for heart failure.  Written material given at graduation.   Core Components/Risk Factors/Patient Goals Review:    Core Components/Risk Factors/Patient Goals at Discharge (Final Review):    ITP Comments:  ITP Comments     Row Name 07/24/22 1028 07/24/22 1640         ITP Comments Virtual Visit completed. Patient informed on EP and RD appointment and 6 Minute walk test. Patient also informed of patient health questionnaires on My Chart. Patient Verbalizes understanding. Visit diagnosis can be found in [facility] 07/12/22. Completed 6MWT and gym orientation. Initial ITP created and sent for review to Dr. Zetta Bills, Medical Director.               Comments: initial ITP

## 2022-07-24 NOTE — Patient Instructions (Signed)
Patient Instructions  Patient Details  Name: Kaitlin Kelley MRN: [id number] Date of Birth: 12/13/1950 Referring Provider:  Ottie Glazier, MD  Below are your personal goals for exercise, nutrition, and risk factors. Our goal is to help you stay on track towards obtaining and maintaining these goals. We will be discussing your progress on these goals with you throughout the program.  Initial Exercise Prescription:  Initial Exercise Prescription - 07/24/22 1600       Date of Initial Exercise RX and Referring Provider   Date 07/24/22    Referring Provider Aleskerov      Oxygen   Maintain Oxygen Saturation 88% or higher      Treadmill   MPH 1.5    Grade 1    Minutes 15    METs 2.35      NuStep   Level 2    SPM 80    Minutes 15    METs 2.15      REL-XR   Level 2    Speed 50    Minutes 15    METs 2.15      Biostep-RELP   Level 2    SPM 50    Minutes 15    METs 2.15      Intensity   THRR 40-80% of Max Heartrate 102-132    Ratings of Perceived Exertion 11-13    Perceived Dyspnea 0-4      Progression   Progression Continue to progress workloads to maintain intensity without signs/symptoms of physical distress.      Resistance Training   Training Prescription Yes    Weight 2    Reps 10-15             Exercise Goals: Frequency: Be able to perform aerobic exercise two to three times per week in program working toward 2-5 days per week of home exercise.  Intensity: Work with a perceived exertion of 11 (fairly light) - 15 (hard) while following your exercise prescription.  We will make changes to your prescription with you as you progress through the program.   Duration: Be able to do 30 to 45 minutes of continuous aerobic exercise in addition to a 5 minute warm-up and a 5 minute cool-down routine.   Nutrition Goals: Your personal nutrition goals will be established when you do your nutrition analysis with the dietician.  The following are general nutrition  guidelines to follow: Cholesterol < 200mg /day Sodium < 1500mg /day Fiber: Women over 50 yrs - 21 grams per day  Personal Goals:  Personal Goals and Risk Factors at Admission - 07/24/22 1650       Core Components/Risk Factors/Patient Goals on Admission    Weight Management Yes;Weight Loss    Intervention Weight Management: Develop a combined nutrition and exercise program designed to reach desired caloric intake, while maintaining appropriate intake of nutrient and fiber, sodium and fats, and appropriate energy expenditure required for the weight goal.;Weight Management: Provide education and appropriate resources to help participant work on and attain dietary goals.;Obesity: Provide education and appropriate resources to help participant work on and attain dietary goals.;Weight Management/Obesity: Establish reasonable short term and long term weight goals.    Admit Weight 220 lb 14.4 oz (100.2 kg)    Goal Weight: Short Term 210 lb (95.3 kg)    Goal Weight: Long Term 200 lb (90.7 kg)    Expected Outcomes Short Term: Continue to assess and modify interventions until short term weight is achieved;Long Term: Adherence to nutrition and physical activity/exercise program  aimed toward attainment of established weight goal;Weight Loss: Understanding of general recommendations for a balanced deficit meal plan, which promotes 1-2 lb weight loss per week and includes a negative energy balance of [phone] kcal/d;Understanding recommendations for meals to include 15-35% energy as protein, 25-35% energy from fat, 35-60% energy from carbohydrates, less than 200mg  of dietary cholesterol, 20-35 gm of total fiber daily;Understanding of distribution of calorie intake throughout the day with the consumption of 4-5 meals/snacks    Improve shortness of breath with ADL's Yes    Intervention Provide education, individualized exercise plan and daily activity instruction to help decrease symptoms of SOB with activities of  daily living.    Expected Outcomes Short Term: Improve cardiorespiratory fitness to achieve a reduction of symptoms when performing ADLs;Long Term: Be able to perform more ADLs without symptoms or delay the onset of symptoms    Diabetes Yes    Intervention Provide education about signs/symptoms and action to take for hypo/hyperglycemia.;Provide education about proper nutrition, including hydration, and aerobic/resistive exercise prescription along with prescribed medications to achieve blood glucose in normal ranges: Fasting glucose 65-99 mg/dL    Expected Outcomes Short Term: Participant verbalizes understanding of the signs/symptoms and immediate care of hyper/hypoglycemia, proper foot care and importance of medication, aerobic/resistive exercise and nutrition plan for blood glucose control.;Long Term: Attainment of HbA1C < 7%.    Hypertension Yes    Intervention Provide education on lifestyle modifcations including regular physical activity/exercise, weight management, moderate sodium restriction and increased consumption of fresh fruit, vegetables, and low fat dairy, alcohol moderation, and smoking cessation.;Monitor prescription use compliance.    Expected Outcomes Short Term: Continued assessment and intervention until BP is < 140/50mm HG in hypertensive participants. < 130/52mm HG in hypertensive participants with diabetes, heart failure or chronic kidney disease.;Long Term: Maintenance of blood pressure at goal levels.    Lipids Yes    Intervention Provide education and support for participant on nutrition & aerobic/resistive exercise along with prescribed medications to achieve LDL 70mg , HDL >40mg .    Expected Outcomes Short Term: Participant states understanding of desired cholesterol values and is compliant with medications prescribed. Participant is following exercise prescription and nutrition guidelines.;Long Term: Cholesterol controlled with medications as prescribed, with individualized  exercise RX and with personalized nutrition plan. Value goals: LDL < 70mg , HDL > 40 mg.             Tobacco Use Initial Evaluation: Social History   Tobacco Use  Smoking Status Never  Smokeless Tobacco Never    Exercise Goals and Review:  Exercise Goals     Row Name 07/24/22 1647             Exercise Goals   Increase Physical Activity Yes       Intervention Provide advice, education, support and counseling about physical activity/exercise needs.;Develop an individualized exercise prescription for aerobic and resistive training based on initial evaluation findings, risk stratification, comorbidities and participant's personal goals.       Expected Outcomes Short Term: Attend rehab on a regular basis to increase amount of physical activity.;Long Term: Add in home exercise to make exercise part of routine and to increase amount of physical activity.;Long Term: Exercising regularly at least 3-5 days a week.       Increase Strength and Stamina Yes       Intervention Provide advice, education, support and counseling about physical activity/exercise needs.;Develop an individualized exercise prescription for aerobic and resistive training based on initial evaluation findings, risk stratification, comorbidities and  participant's personal goals.       Expected Outcomes Short Term: Increase workloads from initial exercise prescription for resistance, speed, and METs.;Short Term: Perform resistance training exercises routinely during rehab and add in resistance training at home;Long Term: Improve cardiorespiratory fitness, muscular endurance and strength as measured by increased METs and functional capacity ( )       Able to understand and use rate of perceived exertion (RPE) scale Yes       Intervention Provide education and explanation on how to use RPE scale       Expected Outcomes Short Term: Able to use RPE daily in rehab to express subjective intensity level;Long Term:  Able to use RPE  to guide intensity level when exercising independently       Able to understand and use Dyspnea scale Yes       Intervention Provide education and explanation on how to use Dyspnea scale       Expected Outcomes Short Term: Able to use Dyspnea scale daily in rehab to express subjective sense of shortness of breath during exertion;Long Term: Able to use Dyspnea scale to guide intensity level when exercising independently       Knowledge and understanding of Target Heart Rate Range (THRR) Yes       Intervention Provide education and explanation of THRR including how the numbers were predicted and where they are located for reference       Expected Outcomes Short Term: Able to state/look up THRR;Long Term: Able to use THRR to govern intensity when exercising independently;Short Term: Able to use daily as guideline for intensity in rehab       Able to check pulse independently Yes       Intervention Provide education and demonstration on how to check pulse in carotid and radial arteries.;Review the importance of being able to check your own pulse for safety during independent exercise       Expected Outcomes Short Term: Able to explain why pulse checking is important during independent exercise;Long Term: Able to check pulse independently and accurately       Understanding of Exercise Prescription Yes       Intervention Provide education, explanation, and written materials on patient's individual exercise prescription       Expected Outcomes Short Term: Able to explain program exercise prescription;Long Term: Able to explain home exercise prescription to exercise independently                Copy of goals given to participant.

## 2022-07-24 NOTE — Progress Notes (Signed)
Virtual Visit completed. Patient informed on EP and RD appointment and 6 Minute walk test. Patient also informed of patient health questionnaires on My Chart. Patient Verbalizes understanding. Visit diagnosis can be found in [facility] 07/12/22.

## 2022-07-25 ENCOUNTER — Ambulatory Visit: Payer: Medicare Other

## 2022-07-26 ENCOUNTER — Encounter: Payer: Medicare Other | Admitting: *Deleted

## 2022-07-26 DIAGNOSIS — D860 Sarcoidosis of lung: Secondary | ICD-10-CM

## 2022-07-26 LAB — GLUCOSE, CAPILLARY: Glucose-Capillary: 387 mg/dL — ABNORMAL HIGH (ref 70–99)

## 2022-07-26 NOTE — Progress Notes (Signed)
Incomplete Session Note  Patient Details  Name: Kaitlin Kelley MRN: [id number] Date of Birth: [birth year] Referring Provider:   Flowsheet Row Pulmonary Rehab from 07/24/2022 in [facility] Cardiac and Pulmonary Rehab  Referring Provider Lanney Gins       Jazilyn Siegenthaler did not complete her rehab session.  Pt's baseline FSBS 387. Per protocol pt unable to complete exercise session today. Pt asymptomatic. Education provided. Pt will take medications at home per prescribed. Pt aware of parameters. Pt voiced understanding and has no further questions at this time.

## 2022-07-27 ENCOUNTER — Ambulatory Visit: Payer: Medicare Other

## 2022-07-28 ENCOUNTER — Encounter: Payer: Medicare Other | Admitting: *Deleted

## 2022-07-28 DIAGNOSIS — D860 Sarcoidosis of lung: Secondary | ICD-10-CM | POA: Diagnosis not present

## 2022-07-28 LAB — GLUCOSE, CAPILLARY
Glucose-Capillary: 208 mg/dL — ABNORMAL HIGH (ref 70–99)
Glucose-Capillary: 175 mg/dL — ABNORMAL HIGH (ref 70–99)

## 2022-07-28 NOTE — Progress Notes (Signed)
Daily Session Note  Patient Details  Name: Kaitlin Kelley MRN: [id number] Date of Birth: 1950/10/25 Referring Provider:   Flowsheet Row Pulmonary Rehab from 07/24/2022 in [facility] Cardiac and Pulmonary Rehab  Referring Provider Lanney Gins       Encounter Date: 07/28/2022  Check In:  Session Check In - 07/28/22 1034       Check-In   Supervising physician immediately available to respond to emergencies See telemetry face sheet for immediately available ER MD    Location [facility]-Cardiac & Pulmonary Rehab    Staff Present Heath Lark, RN, BSN, CCRP;Marvell Fuller, PhD, RN, CNS, CEN;Joseph Tessie Fass, [place]    Virtual Visit No    Medication changes reported     No    Fall or balance concerns reported    No    Warm-up and Cool-down Performed on first and last piece of equipment    Resistance Training Performed Yes    VAD Patient? No    PAD/SET Patient? No      Pain Assessment   Currently in Pain? No/denies                Social History   Tobacco Use  Smoking Status Never  Smokeless Tobacco Never    Goals Met:  Proper associated with RPD/PD & O2 Sat Exercise tolerated well Personal goals reviewed No report of concerns or symptoms today  Goals Unmet:  Not Applicable  Comments: First full day of exercise!  Patient was oriented to gym and equipment including functions, settings, policies, and procedures.  Patient's individual exercise prescription and treatment plan were reviewed.  All starting workloads were established based on the results of the 6 minute walk test done at initial orientation visit.  The plan for exercise progression was also introduced and progression will be customized based on patient's performance and goals.    Dr. Emily Filbert is Medical Director for [place].  Dr. Ottie Glazier is Medical Director for [facility] Pulmonary Rehabilitation.

## 2022-07-31 ENCOUNTER — Encounter: Payer: Medicare Other | Admitting: *Deleted

## 2022-08-01 ENCOUNTER — Ambulatory Visit: Payer: Medicare Other

## 2022-08-02 ENCOUNTER — Encounter: Payer: Self-pay | Admitting: *Deleted

## 2022-08-02 ENCOUNTER — Encounter: Payer: Medicare Other | Admitting: *Deleted

## 2022-08-02 DIAGNOSIS — D860 Sarcoidosis of lung: Secondary | ICD-10-CM | POA: Diagnosis not present

## 2022-08-02 LAB — GLUCOSE, CAPILLARY
Glucose-Capillary: 138 mg/dL — ABNORMAL HIGH (ref 70–99)
Glucose-Capillary: 218 mg/dL — ABNORMAL HIGH (ref 70–99)

## 2022-08-02 NOTE — Progress Notes (Signed)
Daily Session Note  Patient Details  Name: Kaitlin Kelley MRN: [id number] Date of Birth: Nov 29, 1950 Referring Provider:   Flowsheet Row Pulmonary Rehab from 07/24/2022 in [facility] Cardiac and Pulmonary Rehab  Referring Provider Lanney Gins       Encounter Date: 08/02/2022  Check In:  Session Check In - 08/02/22 0948       Check-In   Supervising physician immediately available to respond to emergencies See telemetry face sheet for immediately available ER MD    Location [facility]-Cardiac & Pulmonary Rehab    Staff Present Darlyne Russian, RN, ADN;Joseph Tessie Fass, RCP,RRT,BSRT;Noah Tickle, BS, Exercise Physiologist    Virtual Visit No    Medication changes reported     No    Fall or balance concerns reported    No    Warm-up and Cool-down Performed on first and last piece of equipment    Resistance Training Performed Yes    VAD Patient? No    PAD/SET Patient? No      Pain Assessment   Currently in Pain? No/denies                Social History   Tobacco Use  Smoking Status Never  Smokeless Tobacco Never    Goals Met:  Independence with exercise equipment Exercise tolerated well No report of concerns or symptoms today Strength training completed today  Goals Unmet:  Not Applicable  Comments: Pt able to follow exercise prescription today without complaint.  Will continue to monitor for progression.    Dr. Emily Filbert is Medical Director for [place].  Dr. Ottie Glazier is Medical Director for [facility] Pulmonary Rehabilitation.

## 2022-08-02 NOTE — Progress Notes (Signed)
Pulmonary Individual Treatment Plan  Patient Details  Name: Kaitlin Kelley MRN: [id number] Date of Birth: 29-Dec-1950 Referring Provider:   Flowsheet Row Pulmonary Rehab from 07/24/2022 in [facility] Cardiac and Pulmonary Rehab  Referring Provider Lanney Gins       Initial Encounter Date:  Flowsheet Row Pulmonary Rehab from 07/24/2022 in 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 , Disp: , Rfl:    azelastine (ASTELIN) 0.1 % nasal spray, Place 1 spray into both nostrils as needed for allergies., Disp: , Rfl:    Bacillus Coagulans-Inulin (ALIGN PREBIOTIC-PROBIOTIC PO), Take 1 capsule by mouth daily., Disp: , Rfl:    calcium-vitamin D (OSCAL WITH D) 500-5 MG-MCG tablet, Take 1 tablet by mouth. 1083m, Disp: , Rfl:    Cholecalciferol 50 MCG (2000 UT) CAPS, Take 2,000 Units by mouth daily. , Disp: , Rfl:    Collagen-Vitamin C-Biotin (COLLAGEN 1500/C) 500-50-0.8 MG CAPS, daily., Disp: , Rfl:    cyanocobalamin 100 MCG tablet, Take by mouth., Disp: , Rfl:    Cyanocobalamin 3000 MCG CAPS, Take 3,000 mcg by mouth daily. , Disp: , Rfl:    dicyclomine (BENTYL) 10 MG capsule, Take 10 mg by mouth 4 (four) times daily as needed for spasms. , Disp: , Rfl:    dorzolamide (TRUSOPT) 2 % ophthalmic solution, Place 1 drop into both eyes in the morning and at bedtime. , Disp: , Rfl:    Dulaglutide (TRULICITY) 1.5 M0000000SOPN, Inject 0.5 mLs into the skin once a week. (Patient not taking: Reported on 02/19/2020), Disp: , Rfl:    DULoxetine (CYMBALTA) 60 MG capsule, Take 60 mg by mouth 2 (two) times daily. , Disp: , Rfl:    ferrous gluconate (FERGON) 240 (27 FE) MG tablet, Take by mouth., Disp: , Rfl:    folic acid (FOLVITE) 1 MG tablet, Take 1 mg by mouth daily., Disp: , Rfl:    gabapentin (NEURONTIN) 300 MG capsule,  Take 300 mg by mouth 3 (three) times daily., Disp: , Rfl:    glipiZIDE (GLUCOTROL) 10 MG tablet, Take 10 mg by mouth in the morning and at bedtime. , Disp: , Rfl:    insulin detemir (LEVEMIR) 100 UNIT/ML injection, Inject 30 Units into the skin at bedtime. , Disp: , Rfl:    Insulin Glargine-Lixisenatide (SOLIQUA) 100-33 UNT-MCG/ML SOPN, Inject 30 Units into the skin at bedtime., Disp: , Rfl:    Lactobacillus 0.05-0.05 MG TABS, Take by mouth., Disp: , Rfl:    Lancets 28G MISC, use to check blood sugar three to four times a day or as directed (Patient not taking: Reported on 07/24/2022), Disp: , Rfl:    levothyroxine (SYNTHROID) 75 MCG tablet, Take 1 tablet by mouth daily. (Patient not taking: Reported on 07/24/2022), Disp: , Rfl:    levothyroxine (SYNTHROID, LEVOTHROID) 75 MCG tablet, Take 75 mcg by mouth See admin instructions. Take 75 mcg by mouth daily before breakfast Monday through Saturday (Patient not taking: Reported on 07/24/2022), Disp: , Rfl:    losartan (COZAAR) 50 MG tablet, Take 50 mg by mouth daily., Disp: , Rfl:    methotrexate (RHEUMATREX) 2.5 MG tablet, TAKE 3 TABLETS (7.5 MG TOTAL) BY MOUTH EVERY 7 (SEVEN) DAYS, Disp: , Rfl:    mirtazapine (REMERON) 15 MG tablet, Take 15 mg by mouth at bedtime., Disp: ,  Rfl:    modafinil (PROVIGIL) 100 MG tablet, Take by mouth., Disp: , Rfl:    Multiple Vitamin tablet, Take 1 tablet by mouth daily., Disp: , Rfl:    Multiple Vitamins-Minerals (AIRBORNE PO), Take 2 tablets by mouth in the morning and at bedtime., Disp: , Rfl:    rosuvastatin (CRESTOR) 40 MG tablet, Take 40 mg by mouth at bedtime.  (Patient not taking: Reported on 07/24/2022), Disp: , Rfl:    rosuvastatin (CRESTOR) 40 MG tablet, Take 1 tablet by mouth daily., Disp: , Rfl:    sulfaSALAzine (AZULFIDINE) 500 MG tablet, Take 1,000 mg by mouth 2 (two) times daily., Disp: , Rfl:    sulfaSALAzine (AZULFIDINE) 500 MG tablet, Take 2 tablets by mouth 2 (two) times daily. (Patient not taking:  Reported on 07/24/2022), Disp: , Rfl:    TURMERIC PO, Take 1 capsule by mouth daily. , Disp: , Rfl:   Past Medical History: Past Medical History:  Diagnosis Date   Arthritis    Chronic cough    Depression    Diabetes mellitus without complication (HCC)    Edema    LEGS/FEET   GERD (gastroesophageal reflux disease)    NO MEDS   Headache    H/O MIGRAINES   HOH (hard of hearing)    Hypertension    Hypothyroidism    IBS (irritable bowel syndrome)    Sleep apnea    CPAP    Tobacco Use: Social History   Tobacco Use  Smoking Status Never  Smokeless Tobacco Never    Labs: Review Flowsheet       Latest Ref Rng & Units 02/25/2018  Labs for ITP Cardiac and Pulmonary Rehab  Hemoglobin A1c 4.8 - 5.6 % 9.2      Pulmonary Assessment Scores:  Pulmonary Assessment Scores     Row Name 07/24/22 1652         ADL UCSD   ADL Phase Entry     SOB Score total 56     Rest 0     Walk 1     Stairs 2     Bath 0     Dress 1     Shop 5       CAT Score   CAT Score 18       mMRC Score   mMRC Score 2              UCSD: Self-administered rating of dyspnea associated with activities of daily living (ADLs) 6-point scale (0 = "not at all" to 5 = "maximal or unable to do because of breathlessness")  Scoring Scores range from 0 to 120.  Minimally important difference is 5 units  CAT: CAT can identify the health impairment of COPD patients and is better correlated with disease progression.  CAT has a scoring range of zero to 40. The CAT score is classified into four groups of low (less than 10), medium (10 - 20), high (21-30) and very high (31-40) based on the impact level of disease on health status. A CAT score over 10 suggests significant symptoms.  A worsening CAT score could be explained by an exacerbation, poor medication adherence, poor inhaler technique, or progression of COPD or comorbid conditions.  CAT MCID is 2 points  mMRC: mMRC (Modified Medical Research Council)  Dyspnea Scale is used to assess the degree of baseline functional disability in patients of respiratory disease due to dyspnea. No minimal important difference is established. A decrease in score of 1 point or  greater is considered a positive change.   Pulmonary Function Assessment:  Pulmonary Function Assessment - 07/24/22 1029       Breath   Shortness of Breath Yes;Limiting activity             Exercise Target Goals: Exercise Program Goal: Individual exercise prescription set using results from initial 6 min walk test and THRR while considering  patient's activity barriers and safety.   Exercise Prescription Goal: Initial exercise prescription builds to 30-45 minutes a day of aerobic activity, 2-3 days per week.  Home exercise guidelines will be given to patient during program as part of exercise prescription that the participant will acknowledge.  Education: Aerobic Exercise: - Group verbal and visual presentation on the components of exercise prescription. Introduces F.I.T.T principle from ACSM for exercise prescriptions.  Reviews F.I.T.T. principles of aerobic exercise including progression. Written material given at graduation.   Education: Resistance Exercise: - Group verbal and visual presentation on the components of exercise prescription. Introduces F.I.T.T principle from ACSM for exercise prescriptions  Reviews F.I.T.T. principles of resistance exercise including progression. Written material given at graduation.    Education: Exercise & Equipment Safety: - Individual verbal instruction and demonstration of equipment use and safety with use of the equipment. Flowsheet Row Pulmonary Rehab from 07/24/2022 in [facility] Cardiac and Pulmonary Rehab  Date 07/24/22  Educator [facility]  Instruction Review Code 1- Verbalizes Understanding       Education: Exercise Physiology & General Exercise Guidelines: - Group verbal and written instruction with models to review the exercise  physiology of the cardiovascular system and associated critical values. Provides general exercise guidelines with specific guidelines to those with heart or lung disease.    Education: Flexibility, Balance, Mind/Body Relaxation: - Group verbal and visual presentation with interactive activity on the components of exercise prescription. Introduces F.I.T.T principle from ACSM for exercise prescriptions. Reviews F.I.T.T. principles of flexibility and balance exercise training including progression. Also discusses the mind body connection.  Reviews various relaxation techniques to help reduce and manage stress (i.e. Deep breathing, progressive muscle relaxation, and visualization). Balance handout provided to take home. Written material given at graduation.   Activity Barriers & Risk Stratification:  Activity Barriers & Cardiac Risk Stratification - 07/24/22 1643       Activity Barriers & Cardiac Risk Stratification   Activity Barriers Back Problems;Neck/Spine Problems             6 Minute Walk:  6 Minute Walk     Row Name 07/24/22 1640         6 Minute Walk   Phase Initial     Distance 1215 feet     Walk Time 6 minutes     # of Rest Breaks 0     MPH 2.3     METS 2.15     RPE 13     Perceived Dyspnea  1     VO2 Peak 7.54     Symptoms No     Resting HR 73 bpm     Resting BP 132/82     Resting Oxygen Saturation  96 %     Exercise Oxygen Saturation  during 6 min walk 93 %     Max Ex. HR 98 bpm     Max Ex. BP 140/80     2 Minute Post BP 128/70       Interval HR   1 Minute HR 81     2 Minute HR 95     3  Minute HR 98     4 Minute HR 97     5 Minute HR 97     6 Minute HR 95     2 Minute Post HR 69     Interval Heart Rate? Yes       Interval Oxygen   Interval Oxygen? Yes     Baseline Oxygen Saturation % 96 %     1 Minute Oxygen Saturation % 95 %     1 Minute Liters of Oxygen 0 L     2 Minute Oxygen Saturation % 93 %     2 Minute Liters of Oxygen 0 L     3 Minute  Oxygen Saturation % 95 %     3 Minute Liters of Oxygen 0 L     4 Minute Oxygen Saturation % 96 %     4 Minute Liters of Oxygen 0 L     5 Minute Oxygen Saturation % 95 %     5 Minute Liters of Oxygen 0 L     6 Minute Oxygen Saturation % 96 %     6 Minute Liters of Oxygen 0 L     2 Minute Post Oxygen Saturation % 95 %     2 Minute Post Liters of Oxygen 0 L             Oxygen Initial Assessment:  Oxygen Initial Assessment - 07/24/22 1655       Home Oxygen   Home Oxygen Device None    Sleep Oxygen Prescription CPAP    Home Exercise Oxygen Prescription None    Home Resting Oxygen Prescription None    Compliance with Home Oxygen Use Yes      Initial 6 min Walk   Oxygen Used None      Program Oxygen Prescription   Program Oxygen Prescription None      Intervention   Short Term Goals To learn and exhibit compliance with exercise, home and travel O2 prescription;To learn and understand importance of maintaining oxygen saturations>88%;To learn and demonstrate proper use of respiratory medications;To learn and demonstrate proper pursed lip breathing techniques or other breathing techniques. ;To learn and understand importance of monitoring SPO2 with pulse oximeter and demonstrate accurate use of the pulse oximeter.    Long  Term Goals Exhibits compliance with exercise, home  and travel O2 prescription;Verbalizes importance of monitoring SPO2 with pulse oximeter and return demonstration;Exhibits proper breathing techniques, such as pursed lip breathing or other method taught during program session;Maintenance of O2 saturations>88%;Compliance with respiratory medication;Demonstrates proper use of MDI's             Oxygen Re-Evaluation:  Oxygen Re-Evaluation     Row Name 07/28/22 1036             Program Oxygen Prescription   Program Oxygen Prescription None         Home Oxygen   Home Oxygen Device None       Sleep Oxygen Prescription None       Home Exercise Oxygen  Prescription None       Home Resting Oxygen Prescription None         Goals/Expected Outcomes   Short Term Goals To learn and demonstrate proper pursed lip breathing techniques or other breathing techniques.        Long  Term Goals Exhibits proper breathing techniques, such as pursed lip breathing or other method taught during program session       Comments Reviewed  PLB technique with pt.  Talked about how it works and it's importance in maintaining their exercise saturations.       Goals/Expected Outcomes Short: Become more profiecient at using PLB. Long: Become independent at using PLB.                Oxygen Discharge (Final Oxygen Re-Evaluation):  Oxygen Re-Evaluation - 07/28/22 1036       Program Oxygen Prescription   Program Oxygen Prescription None      Home Oxygen   Home Oxygen Device None    Sleep Oxygen Prescription None    Home Exercise Oxygen Prescription None    Home Resting Oxygen Prescription None      Goals/Expected Outcomes   Short Term Goals To learn and demonstrate proper pursed lip breathing techniques or other breathing techniques.     Long  Term Goals Exhibits proper breathing techniques, such as pursed lip breathing or other method taught during program session    Comments Reviewed PLB technique with pt.  Talked about how it works and it's importance in maintaining their exercise saturations.    Goals/Expected Outcomes Short: Become more profiecient at using PLB. Long: Become independent at using PLB.             Initial Exercise Prescription:  Initial Exercise Prescription - 07/24/22 1600       Date of Initial Exercise RX and Referring Provider   Date 07/24/22    Referring Provider Aleskerov      Oxygen   Maintain Oxygen Saturation 88% or higher      Treadmill   MPH 1.5    Grade 1    Minutes 15    METs 2.35      NuStep   Level 2    SPM 80    Minutes 15    METs 2.15      REL-XR   Level 2    Speed 50    Minutes 15    METs 2.15       Biostep-RELP   Level 2    SPM 50    Minutes 15    METs 2.15      Intensity   THRR 40-80% of Max Heartrate 102-132    Ratings of Perceived Exertion 11-13    Perceived Dyspnea 0-4      Progression   Progression Continue to progress workloads to maintain intensity without signs/symptoms of physical distress.      Resistance Training   Training Prescription Yes    Weight 2    Reps 10-15             Perform Capillary Blood Glucose checks as needed.  Exercise Prescription Changes:   Exercise Prescription Changes     Row Name 07/24/22 1600 07/31/22 1000           Response to Exercise   Blood Pressure (Admit) 132/82 140/60      Blood Pressure (Exercise) 140/80 132/62      Blood Pressure (Exit) 128/70 124/72      Heart Rate (Admit) 73 bpm 83 bpm      Heart Rate (Exercise) 98 bpm 93 bpm      Heart Rate (Exit) 69 bpm 85 bpm      Oxygen Saturation (Admit) 96 % 95 %      Oxygen Saturation (Exercise) 93 % 96 %      Oxygen Saturation (Exit) 95 % 95 %      Rating of Perceived Exertion (Exercise) 13 9  Perceived Dyspnea (Exercise) 1 --      Symptoms 3/10 left hip pain none      Comments 6 MWT results 1st full day of exercise      Duration -- Progress to 30 minutes of  aerobic without signs/symptoms of physical distress      Intensity -- THRR unchanged        Progression   Progression -- Continue to progress workloads to maintain intensity without signs/symptoms of physical distress.      Average METs -- 2.5        Resistance Training   Training Prescription -- Yes      Weight -- 2 lb      Reps -- 10-15        Interval Training   Interval Training -- No        NuStep   Level -- 2      Minutes -- 15        Biostep-RELP   Level -- 2      Minutes -- 15      METs -- 3        Oxygen   Maintain Oxygen Saturation -- 88% or higher               Exercise Comments:   Exercise Comments     Row Name 07/28/22 1035           Exercise Comments  First full day of exercise!  Patient was oriented to gym and equipment including functions, settings, policies, and procedures.  Patient's individual exercise prescription and treatment plan were reviewed.  All starting workloads were established based on the results of the 6 minute walk test done at initial orientation visit.  The plan for exercise progression was also introduced and progression will be customized based on patient's performance and goals.                Exercise Goals and Review:   Exercise Goals     Row Name 07/24/22 1647             Exercise Goals   Increase Physical Activity Yes       Intervention Provide advice, education, support and counseling about physical activity/exercise needs.;Develop an individualized exercise prescription for aerobic and resistive training based on initial evaluation findings, risk stratification, comorbidities and participant's personal goals.       Expected Outcomes Short Term: Attend rehab on a regular basis to increase amount of physical activity.;Long Term: Add in home exercise to make exercise part of routine and to increase amount of physical activity.;Long Term: Exercising regularly at least 3-5 days a week.       Increase Strength and Stamina Yes       Intervention Provide advice, education, support and counseling about physical activity/exercise needs.;Develop an individualized exercise prescription for aerobic and resistive training based on initial evaluation findings, risk stratification, comorbidities and participant's personal goals.       Expected Outcomes Short Term: Increase workloads from initial exercise prescription for resistance, speed, and METs.;Short Term: Perform resistance training exercises routinely during rehab and add in resistance training at home;Long Term: Improve cardiorespiratory fitness, muscular endurance and strength as measured by increased METs and functional capacity (6MWT)       Able to understand and  use rate of perceived exertion (RPE) scale Yes       Intervention Provide education and explanation on how to use RPE scale       Expected Outcomes  Short Term: Able to use RPE daily in rehab to express subjective intensity level;Long Term:  Able to use RPE to guide intensity level when exercising independently       Able to understand and use Dyspnea scale Yes       Intervention Provide education and explanation on how to use Dyspnea scale       Expected Outcomes Short Term: Able to use Dyspnea scale daily in rehab to express subjective sense of shortness of breath during exertion;Long Term: Able to use Dyspnea scale to guide intensity level when exercising independently       Knowledge and understanding of Target Heart Rate Range (THRR) Yes       Intervention Provide education and explanation of THRR including how the numbers were predicted and where they are located for reference       Expected Outcomes Short Term: Able to state/look up THRR;Long Term: Able to use THRR to govern intensity when exercising independently;Short Term: Able to use daily as guideline for intensity in rehab       Able to check pulse independently Yes       Intervention Provide education and demonstration on how to check pulse in carotid and radial arteries.;Review the importance of being able to check your own pulse for safety during independent exercise       Expected Outcomes Short Term: Able to explain why pulse checking is important during independent exercise;Long Term: Able to check pulse independently and accurately       Understanding of Exercise Prescription Yes       Intervention Provide education, explanation, and written materials on patient's individual exercise prescription       Expected Outcomes Short Term: Able to explain program exercise prescription;Long Term: Able to explain home exercise prescription to exercise independently                Exercise Goals Re-Evaluation :  Exercise Goals  Re-Evaluation     Row Name 07/28/22 1035 07/31/22 1035           Exercise Goal Re-Evaluation   Exercise Goals Review Able to understand and use rate of perceived exertion (RPE) scale;Able to understand and use Dyspnea scale;Knowledge and understanding of Target Heart Rate Range (THRR);Understanding of Exercise Prescription Understanding of Exercise Prescription;Increase Physical Activity;Increase Strength and Stamina      Comments Reviewed RPE  and dyspnea scale, THR and program prescription with pt today.  Pt voiced understanding and was given a copy of goals to take home. Tylynn did well for her first day of rehab. She completed both the T4 and BS machines. It may appear her workloads her light due to having RPEs of 8 and 9, so we will work with patient the next couple of sessions to ensure she is working at more challenging workloads to meet more of a 11-13 RPE. HR and O2 saturations were appropriate as well. We will continue to monitor.      Expected Outcomes Short: Use RPE and dyspnea scales daily to regulate intensity. Long: Follow program prescription in THR. Short: Continue to follow initial exercise prescription, and increase workloads when appropriate Long: Continue to increase overall stamina and MET level               Discharge Exercise Prescription (Final Exercise Prescription Changes):  Exercise Prescription Changes - 07/31/22 1000       Response to Exercise   Blood Pressure (Admit) 140/60    Blood Pressure (Exercise) 132/62  Blood Pressure (Exit) 124/72    Heart Rate (Admit) 83 bpm    Heart Rate (Exercise) 93 bpm    Heart Rate (Exit) 85 bpm    Oxygen Saturation (Admit) 95 %    Oxygen Saturation (Exercise) 96 %    Oxygen Saturation (Exit) 95 %    Rating of Perceived Exertion (Exercise) 9    Symptoms none    Comments 1st full day of exercise    Duration Progress to 30 minutes of  aerobic without signs/symptoms of physical distress    Intensity THRR unchanged       Progression   Progression Continue to progress workloads to maintain intensity without signs/symptoms of physical distress.    Average METs 2.5      Resistance Training   Training Prescription Yes    Weight 2 lb    Reps 10-15      Interval Training   Interval Training No      NuStep   Level 2    Minutes 15      Biostep-RELP   Level 2    Minutes 15    METs 3      Oxygen   Maintain Oxygen Saturation 88% or higher             Nutrition:  Target Goals: Understanding of nutrition guidelines, daily intake of sodium 1500mg , cholesterol 200mg , calories 30% from fat and 7% or less from saturated fats, daily to have 5 or more servings of fruits and vegetables.  Education: All About Nutrition: -Group instruction provided by verbal, written material, interactive activities, discussions, models, and posters to present general guidelines for heart healthy nutrition including fat, fiber, MyPlate, the role of sodium in heart healthy nutrition, utilization of the nutrition label, and utilization of this knowledge for meal planning. Follow up email sent as well. Written material given at graduation.   Biometrics:  Pre Biometrics - 07/24/22 1647       Pre Biometrics   Height 5\' 6"  (1.676 m)    Weight 220 lb 14.4 oz (100.2 kg)    Waist Circumference 42 inches    Hip Circumference 49 inches    Waist to Hip Ratio 0.86 %    BMI (Calculated) 35.67    Single Leg Stand 1.75 seconds              Nutrition Therapy Plan and Nutrition Goals:  Nutrition Therapy & Goals - 07/24/22 1649       Intervention Plan   Intervention Prescribe, educate and counsel regarding individualized specific dietary modifications aiming towards targeted core components such as weight, hypertension, lipid management, diabetes, heart failure and other comorbidities.    Expected Outcomes Short Term Goal: Understand basic principles of dietary content, such as calories, fat, sodium, cholesterol and  nutrients.;Short Term Goal: A plan has been developed with personal nutrition goals set during dietitian appointment.;Long Term Goal: Adherence to prescribed nutrition plan.             Nutrition Assessments:  MEDIFICTS Score Key: ?70 Need to make dietary changes  40-70 Heart Healthy Diet ? 40 Therapeutic Level Cholesterol Diet  Flowsheet Row Pulmonary Rehab from 07/24/2022 in [facility] Cardiac and Pulmonary Rehab  Picture Your Plate Total Score on Admission 49      Picture Your Plate Scores: [id number] Unhealthy dietary pattern with much room for improvement. 41-50 Dietary pattern unlikely to meet recommendations for good health and room for improvement. 51-60 More healthful dietary pattern, with some room for improvement.  >  60 Healthy dietary pattern, although there may be some specific behaviors that could be improved.   Nutrition Goals Re-Evaluation:   Nutrition Goals Discharge (Final Nutrition Goals Re-Evaluation):   Psychosocial: Target Goals: Acknowledge presence or absence of significant depression and/or stress, maximize coping skills, provide positive support system. Participant is able to verbalize types and ability to use techniques and skills needed for reducing stress and depression.   Education: Stress, Anxiety, and Depression - Group verbal and visual presentation to define topics covered.  Reviews how body is impacted by stress, anxiety, and depression.  Also discusses healthy ways to reduce stress and to treat/manage anxiety and depression.  Written material given at graduation.   Education: Sleep Hygiene -Provides group verbal and written instruction about how sleep can affect your health.  Define sleep hygiene, discuss sleep cycles and impact of sleep habits. Review good sleep hygiene tips.    Initial Review & Psychosocial Screening:  Initial Psych Review & Screening - 07/24/22 1031       Initial Review   Current issues with Current Anxiety/Panic;Current  Psychotropic Meds;Current Depression;History of Depression;Current Sleep Concerns      Family Dynamics   Good Support System? Yes    Comments She has lost her husband two years ago. She stays up late and has been very tired.  Her son lives with her and is a good support system for her. From time to time she will miss her husband and feel depressed.      Barriers   Psychosocial barriers to participate in program The patient should benefit from training in stress management and relaxation.      Screening Interventions   Interventions Encouraged to exercise;To provide support and resources with identified psychosocial needs;Provide feedback about the scores to participant    Expected Outcomes Short Term goal: Utilizing psychosocial counselor, staff and physician to assist with identification of specific Stressors or current issues interfering with healing process. Setting desired goal for each stressor or current issue identified.;Long Term Goal: Stressors or current issues are controlled or eliminated.;Short Term goal: Identification and review with participant of any Quality of Life or Depression concerns found by scoring the questionnaire.;Long Term goal: The participant improves quality of Life and PHQ9 Scores as seen by post scores and/or verbalization of changes             Quality of Life Scores:  Scores of 19 and below usually indicate a poorer quality of life in these areas.  A difference of  2-3 points is a clinically meaningful difference.  A difference of 2-3 points in the total score of the Quality of Life Index has been associated with significant improvement in overall quality of life, self-image, physical symptoms, and general health in studies assessing change in quality of life.  PHQ-9: Review Flowsheet       07/24/2022  Depression screen PHQ 2/9  Decreased Interest 3  Down, Depressed, Hopeless 1  PHQ - 2 Score 4  Altered sleeping 3  Tired, decreased energy 3  Change  in appetite 2  Feeling bad or failure about yourself  2  Trouble concentrating 3  Moving slowly or fidgety/restless 1  Suicidal thoughts 0  PHQ-9 Score 18  Difficult doing work/chores Very difficult   Interpretation of Total Score  Total Score Depression Severity:  1-4 = Minimal depression, 5-9 = Mild depression, 10-14 = Moderate depression, 15-19 = Moderately severe depression, 20-27 = Severe depression   Psychosocial Evaluation and Intervention:  Psychosocial Evaluation - 07/24/22  [id number]       Psychosocial Evaluation & Interventions   Interventions Encouraged to exercise with the program and follow exercise prescription;Relaxation education;Stress management education    Comments She has lost her husband two years ago. She stays up late and has been very tired. Her son lives with her and is a good support system for her. From time to time she will miss her husband and feel depressed.    Expected Outcomes Short: Start [facility] to help with mood. Long: Maintain a healthy mental state.    Continue Psychosocial Services  Follow up required by staff             Psychosocial Re-Evaluation:   Psychosocial Discharge (Final Psychosocial Re-Evaluation):   Education: Education Goals: Education classes will be provided on a weekly basis, covering required topics. Participant will state understanding/return demonstration of topics presented.  Learning Barriers/Preferences:  Learning Barriers/Preferences - 07/24/22 1030       Learning Barriers/Preferences   Learning Barriers None    Learning Preferences None             General Pulmonary Education Topics:  Infection Prevention: - Provides verbal and written material to individual with discussion of infection control including proper hand washing and proper equipment cleaning during exercise session. Flowsheet Row Pulmonary Rehab from 07/24/2022 in [facility] Cardiac and Pulmonary Rehab  Date 07/24/22  Educator [facility]  Instruction  Review Code 1- Verbalizes Understanding       Falls Prevention: - Provides verbal and written material to individual with discussion of falls prevention and safety. Flowsheet Row Pulmonary Rehab from 07/24/2022 in [facility] Cardiac and Pulmonary Rehab  Date 07/24/22  Educator [facility]  Instruction Review Code 1- Verbalizes Understanding       Chronic Lung Disease Review: - Group verbal instruction with posters, models, PowerPoint presentations and videos,  to review new updates, new respiratory medications, new advancements in procedures and treatments. Providing information on websites and "800" numbers for continued self-education. Includes information about supplement oxygen, available portable oxygen systems, continuous and intermittent flow rates, oxygen safety, concentrators, and Medicare reimbursement for oxygen. Explanation of Pulmonary Drugs, including class, frequency, complications, importance of spacers, rinsing mouth after steroid MDI's, and proper cleaning methods for nebulizers. Review of basic lung anatomy and physiology related to function, structure, and complications of lung disease. Review of risk factors. Discussion about methods for diagnosing sleep apnea and types of masks and machines for OSA. Includes a review of the use of types of environmental controls: home humidity, furnaces, filters, dust mite/pet prevention, HEPA vacuums. Discussion about weather changes, air quality and the benefits of nasal washing. Instruction on Warning signs, infection symptoms, calling MD promptly, preventive modes, and value of vaccinations. Review of effective airway clearance, coughing and/or vibration techniques. Emphasizing that all should Create an Action Plan. Written material given at graduation. Flowsheet Row Pulmonary Rehab from 07/24/2022 in [facility] Cardiac and Pulmonary Rehab  Education need identified 07/24/22       AED/CPR: - Group verbal and written instruction with the use of models to  demonstrate the basic use of the AED with the basic ABC's of resuscitation.    Anatomy and Cardiac Procedures: - Group verbal and visual presentation and models provide information about basic cardiac anatomy and function. Reviews the testing methods done to diagnose heart disease and the outcomes of the test results. Describes the treatment choices: Medical Management, Angioplasty, or Coronary Bypass Surgery for treating various heart conditions including Myocardial Infarction, Angina, Valve Disease, and Cardiac  Arrhythmias.  Written material given at graduation.   Medication Safety: - Group verbal and visual instruction to review commonly prescribed medications for heart and lung disease. Reviews the medication, class of the drug, and side effects. Includes the steps to properly store meds and maintain the prescription regimen.  Written material given at graduation.   Other: -Provides group and verbal instruction on various topics (see comments)   Knowledge Questionnaire Score:  Knowledge Questionnaire Score - 07/24/22 1648       Knowledge Questionnaire Score   Pre Score 14/18              Core Components/Risk Factors/Patient Goals at Admission:  Personal Goals and Risk Factors at Admission - 07/24/22 1650       Core Components/Risk Factors/Patient Goals on Admission    Weight Management Yes;Weight Loss    Intervention Weight Management: Develop a combined nutrition and exercise program designed to reach desired caloric intake, while maintaining appropriate intake of nutrient and fiber, sodium and fats, and appropriate energy expenditure required for the weight goal.;Weight Management: Provide education and appropriate resources to help participant work on and attain dietary goals.;Obesity: Provide education and appropriate resources to help participant work on and attain dietary goals.;Weight Management/Obesity: Establish reasonable short term and long term weight goals.     Admit Weight 220 lb 14.4 oz (100.2 kg)    Goal Weight: Short Term 210 lb (95.3 kg)    Goal Weight: Long Term 200 lb (90.7 kg)    Expected Outcomes Short Term: Continue to assess and modify interventions until short term weight is achieved;Long Term: Adherence to nutrition and physical activity/exercise program aimed toward attainment of established weight goal;Weight Loss: Understanding of general recommendations for a balanced deficit meal plan, which promotes 1-2 lb weight loss per week and includes a negative energy balance of [phone] kcal/d;Understanding recommendations for meals to include 15-35% energy as protein, 25-35% energy from fat, 35-60% energy from carbohydrates, less than 200mg  of dietary cholesterol, 20-35 gm of total fiber daily;Understanding of distribution of calorie intake throughout the day with the consumption of 4-5 meals/snacks    Improve shortness of breath with ADL's Yes    Intervention Provide education, individualized exercise plan and daily activity instruction to help decrease symptoms of SOB with activities of daily living.    Expected Outcomes Short Term: Improve cardiorespiratory fitness to achieve a reduction of symptoms when performing ADLs;Long Term: Be able to perform more ADLs without symptoms or delay the onset of symptoms    Diabetes Yes    Intervention Provide education about signs/symptoms and action to take for hypo/hyperglycemia.;Provide education about proper nutrition, including hydration, and aerobic/resistive exercise prescription along with prescribed medications to achieve blood glucose in normal ranges: Fasting glucose 65-99 mg/dL    Expected Outcomes Short Term: Participant verbalizes understanding of the signs/symptoms and immediate care of hyper/hypoglycemia, proper foot care and importance of medication, aerobic/resistive exercise and nutrition plan for blood glucose control.;Long Term: Attainment of HbA1C < 7%.    Hypertension Yes    Intervention  Provide education on lifestyle modifcations including regular physical activity/exercise, weight management, moderate sodium restriction and increased consumption of fresh fruit, vegetables, and low fat dairy, alcohol moderation, and smoking cessation.;Monitor prescription use compliance.    Expected Outcomes Short Term: Continued assessment and intervention until BP is < 140/67mm HG in hypertensive participants. < 130/55mm HG in hypertensive participants with diabetes, heart failure or chronic kidney disease.;Long Term: Maintenance of blood pressure at goal levels.  Lipids Yes    Intervention Provide education and support for participant on nutrition & aerobic/resistive exercise along with prescribed medications to achieve LDL 70mg , HDL >40mg .    Expected Outcomes Short Term: Participant states understanding of desired cholesterol values and is compliant with medications prescribed. Participant is following exercise prescription and nutrition guidelines.;Long Term: Cholesterol controlled with medications as prescribed, with individualized exercise RX and with personalized nutrition plan. Value goals: LDL < 70mg , HDL > 40 mg.             Education:Diabetes - Individual verbal and written instruction to review signs/symptoms of diabetes, desired ranges of glucose level fasting, after meals and with exercise. Acknowledge that pre and post exercise glucose checks will be done for 3 sessions at entry of program. Flowsheet Row Pulmonary Rehab from 07/24/2022 in [facility] Cardiac and Pulmonary Rehab  Date 07/24/22  Educator [facility]  Instruction Review Code 1- Verbalizes Understanding       Know Your Numbers and Heart Failure: - Group verbal and visual instruction to discuss disease risk factors for cardiac and pulmonary disease and treatment options.  Reviews associated critical values for Overweight/Obesity, Hypertension, Cholesterol, and Diabetes.  Discusses basics of heart failure: signs/symptoms and  treatments.  Introduces Heart Failure Zone chart for action plan for heart failure.  Written material given at graduation.   Core Components/Risk Factors/Patient Goals Review:    Core Components/Risk Factors/Patient Goals at Discharge (Final Review):    ITP Comments:  ITP Comments     Row Name 07/24/22 1028 07/24/22 1640 07/28/22 1035 08/02/22 0854     ITP Comments Virtual Visit completed. Patient informed on EP and RD appointment and 6 Minute walk test. Patient also informed of patient health questionnaires on My Chart. Patient Verbalizes understanding. Visit diagnosis can be found in [facility] 07/12/22. Completed 6MWT and gym orientation. Initial ITP created and sent for review to Dr. Zetta Bills, Medical Director. First full day of exercise!  Patient was oriented to gym and equipment including functions, settings, policies, and procedures.  Patient's individual exercise prescription and treatment plan were reviewed.  All starting workloads were established based on the results of the 6 minute walk test done at initial orientation visit.  The plan for exercise progression was also introduced and progression will be customized based on patient's performance and goals. 30 Day review completed. Medical Director ITP review done, changes made as directed, and signed approval by Medical Director.   new to program             Comments:

## 2022-08-03 ENCOUNTER — Ambulatory Visit: Payer: Medicare Other

## 2022-08-07 ENCOUNTER — Encounter: Payer: Medicare Other | Admitting: *Deleted

## 2022-08-07 DIAGNOSIS — D860 Sarcoidosis of lung: Secondary | ICD-10-CM | POA: Diagnosis not present

## 2022-08-07 LAB — GLUCOSE, CAPILLARY
Glucose-Capillary: 203 mg/dL — ABNORMAL HIGH (ref 70–99)
Glucose-Capillary: 245 mg/dL — ABNORMAL HIGH (ref 70–99)

## 2022-08-07 NOTE — Progress Notes (Signed)
Daily Session Note  Patient Details  Name: Kaitlin Kelley MRN: [id number] Date of Birth: Oct 23, 1950 Referring Provider:   Flowsheet Row Pulmonary Rehab from 07/24/2022 in [facility] Cardiac and Pulmonary Rehab  Referring Provider Lanney Gins       Encounter Date: 08/07/2022  Check In:  Session Check In - 08/07/22 1007       Check-In   Supervising physician immediately available to respond to emergencies See telemetry face sheet for immediately available ER MD    Location [facility]-Cardiac & Pulmonary Rehab    Staff Present Darlyne Russian, RN, Doyce Para, BS, ACSM CEP, Exercise Physiologist;Noah Tickle, BS, Exercise Physiologist    Virtual Visit No    Medication changes reported     No    Fall or balance concerns reported    No    Warm-up and Cool-down Performed on first and last piece of equipment    Resistance Training Performed Yes    VAD Patient? No    PAD/SET Patient? No      Pain Assessment   Currently in Pain? No/denies                Social History   Tobacco Use  Smoking Status Never  Smokeless Tobacco Never    Goals Met:  Independence with exercise equipment Exercise tolerated well No report of concerns or symptoms today Strength training completed today  Goals Unmet:  Not Applicable  Comments: Pt able to follow exercise prescription today without complaint.  Will continue to monitor for progression.    Dr. Emily Filbert is Medical Director for [place].  Dr. Ottie Glazier is Medical Director for [facility] Pulmonary Rehabilitation.

## 2022-08-08 ENCOUNTER — Ambulatory Visit: Payer: Medicare Other

## 2022-08-09 ENCOUNTER — Encounter: Payer: Medicare Other | Admitting: *Deleted

## 2022-08-09 DIAGNOSIS — D860 Sarcoidosis of lung: Secondary | ICD-10-CM

## 2022-08-09 NOTE — Progress Notes (Signed)
Daily Session Note  Patient Details  Name: Kaitlin Kelley MRN: [id number] Date of Birth: 09/19/1950 Referring Provider:   Flowsheet Row Pulmonary Rehab from 07/24/2022 in [facility] Cardiac and Pulmonary Rehab  Referring Provider Lanney Gins       Encounter Date: 08/09/2022  Check In:  Session Check In - 08/09/22 1125       Check-In   Supervising physician immediately available to respond to emergencies See telemetry face sheet for immediately available ER MD    Location [facility]-Cardiac & Pulmonary Rehab    Staff Present Darlyne Russian, RN, ADN;Meredith Sherryll Burger, RN Abel Presto, MS, ACSM CEP, Exercise Physiologist;Noah Tickle, BS, Exercise Physiologist    Virtual Visit No    Medication changes reported     No    Fall or balance concerns reported    No    Warm-up and Cool-down Performed on first and last piece of equipment    Resistance Training Performed Yes    VAD Patient? No    PAD/SET Patient? No      Pain Assessment   Currently in Pain? No/denies                Social History   Tobacco Use  Smoking Status Never  Smokeless Tobacco Never    Goals Met:  Independence with exercise equipment Exercise tolerated well No report of concerns or symptoms today Strength training completed today  Goals Unmet:  Not Applicable  Comments: Pt able to follow exercise prescription today without complaint.  Will continue to monitor for progression.    Dr. Emily Filbert is Medical Director for [place].  Dr. Ottie Glazier is Medical Director for [facility] Pulmonary Rehabilitation.

## 2022-08-10 ENCOUNTER — Ambulatory Visit: Payer: Medicare Other

## 2022-08-11 ENCOUNTER — Encounter: Payer: Medicare Other | Attending: Pulmonary Disease | Admitting: *Deleted

## 2022-08-11 DIAGNOSIS — D860 Sarcoidosis of lung: Secondary | ICD-10-CM | POA: Diagnosis present

## 2022-08-11 NOTE — Progress Notes (Signed)
Daily Session Note  Patient Details  Name: Kaitlin Kelley MRN: [id number] Date of Birth: 08/31/1950 Referring Provider:   Flowsheet Row Pulmonary Rehab from 07/24/2022 in [facility] Cardiac and Pulmonary Rehab  Referring Provider Lanney Gins       Encounter Date: 08/11/2022  Check In:  Session Check In - 08/11/22 1139       Check-In   Supervising physician immediately available to respond to emergencies See telemetry face sheet for immediately available ER MD    Location [facility]-Cardiac & Pulmonary Rehab    Staff Present Heath Lark, RN, BSN, CCRP;Jessica [place], MA, RCEP, CCRP, CCET;Joseph [place], [place]    Virtual Visit No    Medication changes reported     No    Fall or balance concerns reported    No    Warm-up and Cool-down Performed on first and last piece of equipment    Resistance Training Performed Yes    VAD Patient? No    PAD/SET Patient? No      Pain Assessment   Currently in Pain? No/denies                Social History   Tobacco Use  Smoking Status Never  Smokeless Tobacco Never    Goals Met:  Proper associated with RPD/PD & O2 Sat Independence with exercise equipment Exercise tolerated well No report of concerns or symptoms today  Goals Unmet:  Not Applicable  Comments: Pt able to follow exercise prescription today without complaint.  Will continue to monitor for progression.    Dr. Emily Filbert is Medical Director for [place].  Dr. Ottie Glazier is Medical Director for [facility] Pulmonary Rehabilitation.

## 2022-08-14 ENCOUNTER — Encounter: Payer: Medicare Other | Admitting: *Deleted

## 2022-08-14 DIAGNOSIS — D860 Sarcoidosis of lung: Secondary | ICD-10-CM | POA: Diagnosis not present

## 2022-08-14 NOTE — Progress Notes (Signed)
Daily Session Note  Patient Details  Name: Kaitlin Kelley MRN: [id number] Date of Birth: 09/08/1950 Referring Provider:   Flowsheet Row Pulmonary Rehab from 07/24/2022 in [facility] Cardiac and Pulmonary Rehab  Referring Provider Lanney Gins       Encounter Date: 08/14/2022  Check In:  Session Check In - 08/14/22 1107       Check-In   Supervising physician immediately available to respond to emergencies See telemetry face sheet for immediately available ER MD    Location [facility]-Cardiac & Pulmonary Rehab    Staff Present Darlyne Russian, RN, ADN;Meredith Sherryll Burger, RN Moises Blood, BS, ACSM CEP, Exercise Physiologist;Noah Tickle, BS, Exercise Physiologist    Virtual Visit No    Medication changes reported     No    Fall or balance concerns reported    No    Warm-up and Cool-down Performed on first and last piece of equipment    Resistance Training Performed Yes    VAD Patient? No    PAD/SET Patient? No      Pain Assessment   Currently in Pain? No/denies                Social History   Tobacco Use  Smoking Status Never  Smokeless Tobacco Never    Goals Met:  Independence with exercise equipment Exercise tolerated well No report of concerns or symptoms today Strength training completed today  Goals Unmet:  Not Applicable  Comments: Pt able to follow exercise prescription today without complaint.  Will continue to monitor for progression.    Dr. Emily Filbert is Medical Director for [place].  Dr. Ottie Glazier is Medical Director for [facility] Pulmonary Rehabilitation.

## 2022-08-15 ENCOUNTER — Ambulatory Visit: Payer: Medicare Other

## 2022-08-16 ENCOUNTER — Encounter: Payer: Medicare Other | Admitting: *Deleted

## 2022-08-16 DIAGNOSIS — D860 Sarcoidosis of lung: Secondary | ICD-10-CM

## 2022-08-16 NOTE — Progress Notes (Signed)
Daily Session Note  Patient Details  Name: Ivanka Kirshner MRN: [id number] Date of Birth: 1951-01-07 Referring Provider:   Flowsheet Row Pulmonary Rehab from 07/24/2022 in [facility] Cardiac and Pulmonary Rehab  Referring Provider Lanney Gins       Encounter Date: 08/16/2022  Check In:  Session Check In - 08/16/22 1125       Check-In   Supervising physician immediately available to respond to emergencies See telemetry face sheet for immediately available ER MD    Location [facility]-Cardiac & Pulmonary Rehab    Staff Present Darlyne Russian, RN, Lorin [place], MS, ACSM CEP, Exercise Physiologist;Meredith Sherryll Burger, RN BSN;Noah Tickle, BS, Exercise Physiologist    Virtual Visit No    Medication changes reported     No    Fall or balance concerns reported    No    Warm-up and Cool-down Performed on first and last piece of equipment    Resistance Training Performed Yes    VAD Patient? No    PAD/SET Patient? No      Pain Assessment   Currently in Pain? No/denies                Social History   Tobacco Use  Smoking Status Never  Smokeless Tobacco Never    Goals Met:  Independence with exercise equipment Exercise tolerated well No report of concerns or symptoms today Strength training completed today  Goals Unmet:  Not Applicable  Comments: Pt able to follow exercise prescription today without complaint.  Will continue to monitor for progression.  Reviewed home exercise with pt today.  Pt plans to walk and use staff videos at home for exercise.  Reviewed THR, pulse, RPE, sign and symptoms, pulse oximetery and when to call 911 or MD.  Also discussed weather considerations and indoor options.  Pt voiced understanding.   Dr. Emily Filbert is Medical Director for [place].  Dr. Ottie Glazier is Medical Director for [facility] Pulmonary Rehabilitation.

## 2022-08-17 ENCOUNTER — Ambulatory Visit: Payer: Medicare Other

## 2022-08-17 DIAGNOSIS — D860 Sarcoidosis of lung: Secondary | ICD-10-CM

## 2022-08-17 NOTE — Progress Notes (Signed)
Completed initial RD consultation ?

## 2022-08-18 ENCOUNTER — Encounter: Payer: Medicare Other | Admitting: *Deleted

## 2022-08-18 DIAGNOSIS — D860 Sarcoidosis of lung: Secondary | ICD-10-CM | POA: Diagnosis not present

## 2022-08-18 NOTE — Progress Notes (Signed)
Daily Session Note  Patient Details  Name: Kaitlin Kelley MRN: [id number] Date of Birth: Mar 02, 1951 Referring Provider:   Flowsheet Row Pulmonary Rehab from 07/24/2022 in [facility] Cardiac and Pulmonary Rehab  Referring Provider Lanney Gins       Encounter Date: 08/18/2022  Check In:  Session Check In - 08/18/22 1138       Check-In   Supervising physician immediately available to respond to emergencies See telemetry face sheet for immediately available ER MD    Location [facility]-Cardiac & Pulmonary Rehab    Staff Present Heath Lark, RN, BSN, CCRP;Jessica [place], MA, RCEP, CCRP, CCET;[place], [place]    Virtual Visit No    Medication changes reported     No    Fall or balance concerns reported    No    Warm-up and Cool-down Performed on first and last piece of equipment    Resistance Training Performed Yes    VAD Patient? No    PAD/SET Patient? No      Pain Assessment   Currently in Pain? No/denies                Social History   Tobacco Use  Smoking Status Never  Smokeless Tobacco Never    Goals Met:  Proper associated with RPD/PD & O2 Sat Independence with exercise equipment Exercise tolerated well No report of concerns or symptoms today  Goals Unmet:  Not Applicable  Comments: Pt able to follow exercise prescription today without complaint.  Will continue to monitor for progression.    Dr. Emily Filbert is Medical Director for [place].  Dr. Ottie Glazier is Medical Director for [facility] Pulmonary Rehabilitation.

## 2022-08-21 ENCOUNTER — Encounter: Payer: Medicare Other | Admitting: *Deleted

## 2022-08-21 DIAGNOSIS — D860 Sarcoidosis of lung: Secondary | ICD-10-CM

## 2022-08-21 NOTE — Progress Notes (Signed)
Daily Session Note  Patient Details  Name: Kaitlin Kelley MRN: [id number] Date of Birth: [birth year] Referring Provider:   Flowsheet Row Pulmonary Rehab from 07/24/2022 in [facility] Cardiac and Pulmonary Rehab  Referring Provider Lanney Gins       Encounter Date: 08/21/2022  Check In:  Session Check In - 08/21/22 1116       Check-In   Supervising physician immediately available to respond to emergencies See telemetry face sheet for immediately available ER MD    Location [facility]-Cardiac & Pulmonary Rehab    Staff Present Renita Papa, RN BSN;Joseph [place], RCP,RRT,BSRT;Noah Tickle, BS, Exercise Physiologist;Jessica [place], [place], RCEP, CCRP, CCET    Virtual Visit No    Medication changes reported     No    Fall or balance concerns reported    No    Warm-up and Cool-down Performed on first and last piece of equipment    Resistance Training Performed Yes    VAD Patient? No    PAD/SET Patient? No      Pain Assessment   Currently in Pain? No/denies                Social History   Tobacco Use  Smoking Status Never  Smokeless Tobacco Never    Goals Met:  Independence with exercise equipment Exercise tolerated well No report of concerns or symptoms today Strength training completed today  Goals Unmet:  Not Applicable  Comments: Pt able to follow exercise prescription today without complaint.  Will continue to monitor for progression.    Dr. Emily Filbert is Medical Director for [place].  Dr. Ottie Glazier is Medical Director for [facility] Pulmonary Rehabilitation.

## 2022-08-22 ENCOUNTER — Ambulatory Visit: Payer: Medicare Other

## 2022-08-23 ENCOUNTER — Encounter: Payer: Medicare Other | Admitting: *Deleted

## 2022-08-23 DIAGNOSIS — D860 Sarcoidosis of lung: Secondary | ICD-10-CM | POA: Diagnosis not present

## 2022-08-23 NOTE — Progress Notes (Signed)
Daily Session Note  Patient Details  Name: Kaitlin Kelley MRN: [id number] Date of Birth: 01-Mar-1951 Referring Provider:   Flowsheet Row Pulmonary Rehab from 07/24/2022 in [facility] Cardiac and Pulmonary Rehab  Referring Provider Lanney Gins       Encounter Date: 08/23/2022  Check In:  Session Check In - 08/23/22 1134       Check-In   Supervising physician immediately available to respond to emergencies See telemetry face sheet for immediately available ER MD    Location [facility]-Cardiac & Pulmonary Rehab    Staff Present Darlyne Russian, RN, ADN;Joseph Tessie Fass, RCP,RRT,BSRT;Noah Tickle, BS, Exercise Physiologist    Virtual Visit No    Medication changes reported     No    Fall or balance concerns reported    No    Warm-up and Cool-down Performed on first and last piece of equipment    Resistance Training Performed Yes    VAD Patient? No    PAD/SET Patient? No      Pain Assessment   Currently in Pain? No/denies                Social History   Tobacco Use  Smoking Status Never  Smokeless Tobacco Never    Goals Met:  Independence with exercise equipment Exercise tolerated well No report of concerns or symptoms today Strength training completed today  Goals Unmet:  Not Applicable  Comments: Pt able to follow exercise prescription today without complaint.  Will continue to monitor for progression.    Dr. Emily Filbert is Medical Director for [place].  Dr. Ottie Glazier is Medical Director for [facility] Pulmonary Rehabilitation.

## 2022-08-24 ENCOUNTER — Ambulatory Visit: Payer: Medicare Other

## 2022-08-25 ENCOUNTER — Encounter: Payer: Medicare Other | Admitting: *Deleted

## 2022-08-25 DIAGNOSIS — D860 Sarcoidosis of lung: Secondary | ICD-10-CM

## 2022-08-25 NOTE — Progress Notes (Signed)
Daily Session Note  Patient Details  Name: Kaitlin Kelley MRN: [id number] Date of Birth: Jan 05, 1951 Referring Provider:   Flowsheet Row Pulmonary Rehab from 07/24/2022 in [facility] Cardiac and Pulmonary Rehab  Referring Provider Lanney Gins       Encounter Date: 08/25/2022  Check In:  Session Check In - 08/25/22 1112       Check-In   Supervising physician immediately available to respond to emergencies See telemetry face sheet for immediately available ER MD    Location [facility]-Cardiac & Pulmonary Rehab    Staff Present Darlyne Russian, RN, ADN;Jessica Luan [place], MA, RCEP, CCRP, CCET;Joseph [place], [place]    Virtual Visit No    Medication changes reported     No    Fall or balance concerns reported    No    Warm-up and Cool-down Performed on first and last piece of equipment    Resistance Training Performed Yes    VAD Patient? No    PAD/SET Patient? No      Pain Assessment   Currently in Pain? No/denies                Social History   Tobacco Use  Smoking Status Never  Smokeless Tobacco Never    Goals Met:  Independence with exercise equipment Exercise tolerated well No report of concerns or symptoms today Strength training completed today  Goals Unmet:  Not Applicable  Comments: Pt able to follow exercise prescription today without complaint.  Will continue to monitor for progression.    Dr. Emily Filbert is Medical Director for [place].  Dr. Ottie Glazier is Medical Director for [facility] Pulmonary Rehabilitation.

## 2022-08-28 ENCOUNTER — Encounter: Payer: Medicare Other | Admitting: *Deleted

## 2022-08-29 ENCOUNTER — Encounter: Payer: Medicare Other | Admitting: *Deleted

## 2022-08-29 DIAGNOSIS — D860 Sarcoidosis of lung: Secondary | ICD-10-CM | POA: Diagnosis not present

## 2022-08-29 NOTE — Progress Notes (Signed)
Daily Session Note  Patient Details  Name: Kaitlin Kelley MRN: [id number] Date of Birth: May 16, 1951 Referring Provider:   Flowsheet Row Pulmonary Rehab from 07/24/2022 in [facility] Cardiac and Pulmonary Rehab  Referring Provider Lanney Gins       Encounter Date: 08/29/2022  Check In:  Session Check In - 08/29/22 1151       Check-In   Supervising physician immediately available to respond to emergencies See telemetry face sheet for immediately available ER MD    Location [facility]-Cardiac & Pulmonary Rehab    Staff Present Nyoka Cowden, RN, BSN, Fenton Foy, BS, Exercise Physiologist    Virtual Visit No    Medication changes reported     No    Fall or balance concerns reported    No    Tobacco Cessation No Change    Warm-up and Cool-down Performed on first and last piece of equipment    Resistance Training Performed Yes    VAD Patient? No    PAD/SET Patient? No      Pain Assessment   Currently in Pain? No/denies    Multiple Pain Sites No                Social History   Tobacco Use  Smoking Status Never  Smokeless Tobacco Never    Goals Met:  Independence with exercise equipment Exercise tolerated well No report of concerns or symptoms today  Goals Unmet:  Not Applicable  Comments: Pt able to follow exercise prescription today without complaint.  Will continue to monitor for progression.    Dr. Emily Filbert is Medical Director for [place].  Dr. Ottie Glazier is Medical Director for [facility] Pulmonary Rehabilitation.

## 2022-08-30 ENCOUNTER — Encounter: Payer: Medicare Other | Admitting: *Deleted

## 2022-08-30 ENCOUNTER — Encounter: Payer: Self-pay | Admitting: *Deleted

## 2022-08-30 DIAGNOSIS — D860 Sarcoidosis of lung: Secondary | ICD-10-CM

## 2022-08-30 NOTE — Progress Notes (Signed)
Pulmonary Individual Treatment Plan  Patient Details  Name: Kaitlin Kelley MRN: [id number] Date of Birth: 29-Dec-1950 Referring Provider:   Flowsheet Row Pulmonary Rehab from 07/24/2022 in [facility] Cardiac and Pulmonary Rehab  Referring Provider Lanney Gins       Initial Encounter Date:  Flowsheet Row Pulmonary Rehab from 07/24/2022 in 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 , Disp: , Rfl:    azelastine (ASTELIN) 0.1 % nasal spray, Place 1 spray into both nostrils as needed for allergies., Disp: , Rfl:    Bacillus Coagulans-Inulin (ALIGN PREBIOTIC-PROBIOTIC PO), Take 1 capsule by mouth daily., Disp: , Rfl:    calcium-vitamin D (OSCAL WITH D) 500-5 MG-MCG tablet, Take 1 tablet by mouth. 1083m, Disp: , Rfl:    Cholecalciferol 50 MCG (2000 UT) CAPS, Take 2,000 Units by mouth daily. , Disp: , Rfl:    Collagen-Vitamin C-Biotin (COLLAGEN 1500/C) 500-50-0.8 MG CAPS, daily., Disp: , Rfl:    cyanocobalamin 100 MCG tablet, Take by mouth., Disp: , Rfl:    Cyanocobalamin 3000 MCG CAPS, Take 3,000 mcg by mouth daily. , Disp: , Rfl:    dicyclomine (BENTYL) 10 MG capsule, Take 10 mg by mouth 4 (four) times daily as needed for spasms. , Disp: , Rfl:    dorzolamide (TRUSOPT) 2 % ophthalmic solution, Place 1 drop into both eyes in the morning and at bedtime. , Disp: , Rfl:    Dulaglutide (TRULICITY) 1.5 M0000000SOPN, Inject 0.5 mLs into the skin once a week. (Patient not taking: Reported on 02/19/2020), Disp: , Rfl:    DULoxetine (CYMBALTA) 60 MG capsule, Take 60 mg by mouth 2 (two) times daily. , Disp: , Rfl:    ferrous gluconate (FERGON) 240 (27 FE) MG tablet, Take by mouth., Disp: , Rfl:    folic acid (FOLVITE) 1 MG tablet, Take 1 mg by mouth daily., Disp: , Rfl:    gabapentin (NEURONTIN) 300 MG capsule,  Take 300 mg by mouth 3 (three) times daily., Disp: , Rfl:    glipiZIDE (GLUCOTROL) 10 MG tablet, Take 10 mg by mouth in the morning and at bedtime. , Disp: , Rfl:    insulin detemir (LEVEMIR) 100 UNIT/ML injection, Inject 30 Units into the skin at bedtime. , Disp: , Rfl:    Insulin Glargine-Lixisenatide (SOLIQUA) 100-33 UNT-MCG/ML SOPN, Inject 30 Units into the skin at bedtime., Disp: , Rfl:    Lactobacillus 0.05-0.05 MG TABS, Take by mouth., Disp: , Rfl:    Lancets 28G MISC, use to check blood sugar three to four times a day or as directed (Patient not taking: Reported on 07/24/2022), Disp: , Rfl:    levothyroxine (SYNTHROID) 75 MCG tablet, Take 1 tablet by mouth daily. (Patient not taking: Reported on 07/24/2022), Disp: , Rfl:    levothyroxine (SYNTHROID, LEVOTHROID) 75 MCG tablet, Take 75 mcg by mouth See admin instructions. Take 75 mcg by mouth daily before breakfast Monday through Saturday (Patient not taking: Reported on 07/24/2022), Disp: , Rfl:    losartan (COZAAR) 50 MG tablet, Take 50 mg by mouth daily., Disp: , Rfl:    methotrexate (RHEUMATREX) 2.5 MG tablet, TAKE 3 TABLETS (7.5 MG TOTAL) BY MOUTH EVERY 7 (SEVEN) DAYS, Disp: , Rfl:    mirtazapine (REMERON) 15 MG tablet, Take 15 mg by mouth at bedtime., Disp: ,  Rfl:    modafinil (PROVIGIL) 100 MG tablet, Take by mouth., Disp: , Rfl:    Multiple Vitamin tablet, Take 1 tablet by mouth daily., Disp: , Rfl:    Multiple Vitamins-Minerals (AIRBORNE PO), Take 2 tablets by mouth in the morning and at bedtime., Disp: , Rfl:    rosuvastatin (CRESTOR) 40 MG tablet, Take 40 mg by mouth at bedtime.  (Patient not taking: Reported on 07/24/2022), Disp: , Rfl:    rosuvastatin (CRESTOR) 40 MG tablet, Take 1 tablet by mouth daily., Disp: , Rfl:    sulfaSALAzine (AZULFIDINE) 500 MG tablet, Take 1,000 mg by mouth 2 (two) times daily., Disp: , Rfl:    sulfaSALAzine (AZULFIDINE) 500 MG tablet, Take 2 tablets by mouth 2 (two) times daily. (Patient not taking:  Reported on 07/24/2022), Disp: , Rfl:    TURMERIC PO, Take 1 capsule by mouth daily. , Disp: , Rfl:   Past Medical History: Past Medical History:  Diagnosis Date   Arthritis    Chronic cough    Depression    Diabetes mellitus without complication (HCC)    Edema    LEGS/FEET   GERD (gastroesophageal reflux disease)    NO MEDS   Headache    H/O MIGRAINES   HOH (hard of hearing)    Hypertension    Hypothyroidism    IBS (irritable bowel syndrome)    Sleep apnea    CPAP    Tobacco Use: Social History   Tobacco Use  Smoking Status Never  Smokeless Tobacco Never    Labs: Review Flowsheet       Latest Ref Rng & Units 02/25/2018  Labs for ITP Cardiac and Pulmonary Rehab  Hemoglobin A1c 4.8 - 5.6 % 9.2      Pulmonary Assessment Scores:  Pulmonary Assessment Scores     Row Name 07/24/22 1652         ADL UCSD   ADL Phase Entry     SOB Score total 56     Rest 0     Walk 1     Stairs 2     Bath 0     Dress 1     Shop 5       CAT Score   CAT Score 18       mMRC Score   mMRC Score 2              UCSD: Self-administered rating of dyspnea associated with activities of daily living (ADLs) 6-point scale (0 = "not at all" to 5 = "maximal or unable to do because of breathlessness")  Scoring Scores range from 0 to 120.  Minimally important difference is 5 units  CAT: CAT can identify the health impairment of COPD patients and is better correlated with disease progression.  CAT has a scoring range of zero to 40. The CAT score is classified into four groups of low (less than 10), medium (10 - 20), high (21-30) and very high (31-40) based on the impact level of disease on health status. A CAT score over 10 suggests significant symptoms.  A worsening CAT score could be explained by an exacerbation, poor medication adherence, poor inhaler technique, or progression of COPD or comorbid conditions.  CAT MCID is 2 points  mMRC: mMRC (Modified Medical Research Council)  Dyspnea Scale is used to assess the degree of baseline functional disability in patients of respiratory disease due to dyspnea. No minimal important difference is established. A decrease in score of 1 point or  greater is considered a positive change.   Pulmonary Function Assessment:  Pulmonary Function Assessment - 07/24/22 1029       Breath   Shortness of Breath Yes;Limiting activity             Exercise Target Goals: Exercise Program Goal: Individual exercise prescription set using results from initial 6 min walk test and THRR while considering  patient's activity barriers and safety.   Exercise Prescription Goal: Initial exercise prescription builds to 30-45 minutes a day of aerobic activity, 2-3 days per week.  Home exercise guidelines will be given to patient during program as part of exercise prescription that the participant will acknowledge.  Education: Aerobic Exercise: - Group verbal and visual presentation on the components of exercise prescription. Introduces F.I.T.T principle from ACSM for exercise prescriptions.  Reviews F.I.T.T. principles of aerobic exercise including progression. Written material given at graduation.   Education: Resistance Exercise: - Group verbal and visual presentation on the components of exercise prescription. Introduces F.I.T.T principle from ACSM for exercise prescriptions  Reviews F.I.T.T. principles of resistance exercise including progression. Written material given at graduation. Flowsheet Row Pulmonary Rehab from 08/09/2022 in [facility] Cardiac and Pulmonary Rehab  Date 08/02/22  Educator [facility]  Instruction Review Code 1- Verbalizes Understanding        Education: Exercise & Equipment Safety: - Individual verbal instruction and demonstration of equipment use and safety with use of the equipment. Flowsheet Row Pulmonary Rehab from 08/09/2022 in [facility] Cardiac and Pulmonary Rehab  Date 07/24/22  Educator [facility]  Instruction Review Code 1-  Verbalizes Understanding       Education: Exercise Physiology & General Exercise Guidelines: - Group verbal and written instruction with models to review the exercise physiology of the cardiovascular system and associated critical values. Provides general exercise guidelines with specific guidelines to those with heart or lung disease.    Education: Flexibility, Balance, Mind/Body Relaxation: - Group verbal and visual presentation with interactive activity on the components of exercise prescription. Introduces F.I.T.T principle from ACSM for exercise prescriptions. Reviews F.I.T.T. principles of flexibility and balance exercise training including progression. Also discusses the mind body connection.  Reviews various relaxation techniques to help reduce and manage stress (i.e. Deep breathing, progressive muscle relaxation, and visualization). Balance handout provided to take home. Written material given at graduation. Flowsheet Row Pulmonary Rehab from 08/09/2022 in [facility] Cardiac and Pulmonary Rehab  Date 08/09/22  Educator [facility]  Instruction Review Code 1- Verbalizes Understanding       Activity Barriers & Risk Stratification:  Activity Barriers & Cardiac Risk Stratification - 07/24/22 1643       Activity Barriers & Cardiac Risk Stratification   Activity Barriers Back Problems;Neck/Spine Problems             6 Minute Walk:  6 Minute Walk     Row Name 07/24/22 1640         6 Minute Walk   Phase Initial     Distance 1215 feet     Walk Time 6 minutes     # of Rest Breaks 0     MPH 2.3     METS 2.15     RPE 13     Perceived Dyspnea  1     VO2 Peak 7.54     Symptoms No     Resting HR 73 bpm     Resting BP 132/82     Resting Oxygen Saturation  96 %     Exercise Oxygen Saturation  during  6 min walk 93 %     Max Ex. HR 98 bpm     Max Ex. BP 140/80     2 Minute Post BP 128/70       Interval HR   1 Minute HR 81     2 Minute HR 95     3 Minute HR 98     4 Minute HR 97      5 Minute HR 97     6 Minute HR 95     2 Minute Post HR 69     Interval Heart Rate? Yes       Interval Oxygen   Interval Oxygen? Yes     Baseline Oxygen Saturation % 96 %     1 Minute Oxygen Saturation % 95 %     1 Minute Liters of Oxygen 0 L     2 Minute Oxygen Saturation % 93 %     2 Minute Liters of Oxygen 0 L     3 Minute Oxygen Saturation % 95 %     3 Minute Liters of Oxygen 0 L     4 Minute Oxygen Saturation % 96 %     4 Minute Liters of Oxygen 0 L     5 Minute Oxygen Saturation % 95 %     5 Minute Liters of Oxygen 0 L     6 Minute Oxygen Saturation % 96 %     6 Minute Liters of Oxygen 0 L     2 Minute Post Oxygen Saturation % 95 %     2 Minute Post Liters of Oxygen 0 L             Oxygen Initial Assessment:  Oxygen Initial Assessment - 07/24/22 1655       Home Oxygen   Home Oxygen Device None    Sleep Oxygen Prescription CPAP    Home Exercise Oxygen Prescription None    Home Resting Oxygen Prescription None    Compliance with Home Oxygen Use Yes      Initial 6 min Walk   Oxygen Used None      Program Oxygen Prescription   Program Oxygen Prescription None      Intervention   Short Term Goals To learn and exhibit compliance with exercise, home and travel O2 prescription;To learn and understand importance of maintaining oxygen saturations>88%;To learn and demonstrate proper use of respiratory medications;To learn and demonstrate proper pursed lip breathing techniques or other breathing techniques. ;To learn and understand importance of monitoring SPO2 with pulse oximeter and demonstrate accurate use of the pulse oximeter.    Long  Term Goals Exhibits compliance with exercise, home  and travel O2 prescription;Verbalizes importance of monitoring SPO2 with pulse oximeter and return demonstration;Exhibits proper breathing techniques, such as pursed lip breathing or other method taught during program session;Maintenance of O2 saturations>88%;Compliance with respiratory  medication;Demonstrates proper use of MDI's             Oxygen Re-Evaluation:  Oxygen Re-Evaluation     Row Name 07/28/22 1036 08/09/22 1151 08/16/22 1146         Program Oxygen Prescription   Program Oxygen Prescription None None None       Home Oxygen   Home Oxygen Device None None None     Sleep Oxygen Prescription None None CPAP;None     Home Exercise Oxygen Prescription None None None     Home Resting Oxygen Prescription None None None  Compliance with Home Oxygen Use -- -- Yes       Goals/Expected Outcomes   Short Term Goals To learn and demonstrate proper pursed lip breathing techniques or other breathing techniques.  To learn and demonstrate proper pursed lip breathing techniques or other breathing techniques.  To learn and demonstrate proper pursed lip breathing techniques or other breathing techniques. ;To learn and understand importance of monitoring SPO2 with pulse oximeter and demonstrate accurate use of the pulse oximeter.;To learn and understand importance of maintaining oxygen saturations>88%;To learn and demonstrate proper use of respiratory medications     Long  Term Goals Exhibits proper breathing techniques, such as pursed lip breathing or other method taught during program session Exhibits proper breathing techniques, such as pursed lip breathing or other method taught during program session Exhibits proper breathing techniques, such as pursed lip breathing or other method taught during program session;Maintenance of O2 saturations>88%;Verbalizes importance of monitoring SPO2 with pulse oximeter and return demonstration;Compliance with respiratory medication;Demonstrates proper use of MDI's     Comments Reviewed PLB technique with pt.  Talked about how it works and it's importance in maintaining their exercise saturations. Diaphragmatic and PLB breathing explained and performed with patient. Patient has a better understanding of how to do these exercises to help  with breathing performance and relaxation. Patient performed breathing techniques adequately and to practice further at home. Kenneshia is doing well in rehab. She is doing better with her breathing. She tries to ues her PLB.  She is compliant with her CPAP each night.  She does check her saturations at home and has been doing well. She is also good about using her inhaler each day.     Goals/Expected Outcomes Short: Become more profiecient at using PLB. Long: Become independent at using PLB. Short: practice PLB and diaphragmatic breathing at home. Long: Use PLB and diaphragmatic breathing independently post [facility]. Short: Continue to practice PLB to get better Long: continued compliance with CPAP              Oxygen Discharge (Final Oxygen Re-Evaluation):  Oxygen Re-Evaluation - 08/16/22 1146       Program Oxygen Prescription   Program Oxygen Prescription None      Home Oxygen   Home Oxygen Device None    Sleep Oxygen Prescription CPAP;None    Home Exercise Oxygen Prescription None    Home Resting Oxygen Prescription None    Compliance with Home Oxygen Use Yes      Goals/Expected Outcomes   Short Term Goals To learn and demonstrate proper pursed lip breathing techniques or other breathing techniques. ;To learn and understand importance of monitoring SPO2 with pulse oximeter and demonstrate accurate use of the pulse oximeter.;To learn and understand importance of maintaining oxygen saturations>88%;To learn and demonstrate proper use of respiratory medications    Long  Term Goals Exhibits proper breathing techniques, such as pursed lip breathing or other method taught during program session;Maintenance of O2 saturations>88%;Verbalizes importance of monitoring SPO2 with pulse oximeter and return demonstration;Compliance with respiratory medication;Demonstrates proper use of MDI's    Comments Vernette is doing well in rehab. She is doing better with her breathing. She tries to ues her PLB.  She is  compliant with her CPAP each night.  She does check her saturations at home and has been doing well. She is also good about using her inhaler each day.    Goals/Expected Outcomes Short: Continue to practice PLB to get better Long: continued compliance with CPAP  Initial Exercise Prescription:  Initial Exercise Prescription - 07/24/22 1600       Date of Initial Exercise RX and Referring Provider   Date 07/24/22    Referring Provider Aleskerov      Oxygen   Maintain Oxygen Saturation 88% or higher      Treadmill   MPH 1.5    Grade 1    Minutes 15    METs 2.35      NuStep   Level 2    SPM 80    Minutes 15    METs 2.15      REL-XR   Level 2    Speed 50    Minutes 15    METs 2.15      Biostep-RELP   Level 2    SPM 50    Minutes 15    METs 2.15      Intensity   THRR 40-80% of Max Heartrate 102-132    Ratings of Perceived Exertion 11-13    Perceived Dyspnea 0-4      Progression   Progression Continue to progress workloads to maintain intensity without signs/symptoms of physical distress.      Resistance Training   Training Prescription Yes    Weight 2    Reps 10-15             Perform Capillary Blood Glucose checks as needed.  Exercise Prescription Changes:   Exercise Prescription Changes     Row Name 07/24/22 1600 07/31/22 1000 08/14/22 1300 08/16/22 1100 08/28/22 0900     Response to Exercise   Blood Pressure (Admit) 132/82 140/60 110/62 -- 124/82   Blood Pressure (Exercise) 140/80 132/62 144/68 -- 166/72   Blood Pressure (Exit) 128/70 124/72 102/62 -- 120/72   Heart Rate (Admit) 73 bpm 83 bpm 92 bpm -- 81 bpm   Heart Rate (Exercise) 98 bpm 93 bpm 109 bpm -- 133 bpm   Heart Rate (Exit) 69 bpm 85 bpm 75 bpm -- 92 bpm   Oxygen Saturation (Admit) 96 % 95 % 96 % -- 98 %   Oxygen Saturation (Exercise) 93 % 96 % 94 % -- 95 %   Oxygen Saturation (Exit) 95 % 95 % 96 % -- 97 %   Rating of Perceived Exertion (Exercise) 13 9 16 $ -- 13    Perceived Dyspnea (Exercise) 1 -- -- -- 2   Symptoms 3/10 left hip pain none none -- SOB   Comments 6 MWT results 1st full day of exercise -- -- --   Duration -- Progress to 30 minutes of  aerobic without signs/symptoms of physical distress Continue with 30 min of aerobic exercise without signs/symptoms of physical distress. -- Progress to 30 minutes of  aerobic without signs/symptoms of physical distress   Intensity -- THRR unchanged THRR unchanged -- THRR unchanged     Progression   Progression -- Continue to progress workloads to maintain intensity without signs/symptoms of physical distress. Continue to progress workloads to maintain intensity without signs/symptoms of physical distress. -- Continue to progress workloads to maintain intensity without signs/symptoms of physical distress.   Average METs -- 2.5 2.34 -- 2.9     Resistance Training   Training Prescription -- Yes Yes -- Yes   Weight -- 2 lb 2 lb -- 2 lb   Reps -- 10-15 10-15 -- 10-15     Interval Training   Interval Training -- No No -- No     Treadmill   MPH -- -- 2.3 --  2.5   Grade -- -- 0 -- 0.5   Minutes -- -- 15 -- 15   METs -- -- -- -- 3.09     Recumbant Bike   Level -- -- -- -- 5   Watts -- -- -- -- 37   Minutes -- -- -- -- 15   METs -- -- -- -- 3.15     NuStep   Level -- 2 2 -- 5   Minutes -- 15 15 -- 15   METs -- -- 2.8 -- 4     REL-XR   Level -- -- 2 -- 6   Minutes -- -- 15 -- 15   METs -- -- 2 -- --     Biostep-RELP   Level -- 2 -- -- --   Minutes -- 15 -- -- --   METs -- 3 -- -- --     Home Exercise Plan   Plans to continue exercise at -- -- -- Home (comment)  walking, staff videos Home (comment)  walking, staff videos   Frequency -- -- -- Add 2 additional days to program exercise sessions. Add 2 additional days to program exercise sessions.   Initial Home Exercises Provided -- -- -- 08/16/22 08/16/22     Oxygen   Maintain Oxygen Saturation -- 88% or higher 88% or higher -- 88% or higher             Exercise Comments:   Exercise Comments     Row Name 07/28/22 1035           Exercise Comments First full day of exercise!  Patient was oriented to gym and equipment including functions, settings, policies, and procedures.  Patient's individual exercise prescription and treatment plan were reviewed.  All starting workloads were established based on the results of the 6 minute walk test done at initial orientation visit.  The plan for exercise progression was also introduced and progression will be customized based on patient's performance and goals.                Exercise Goals and Review:   Exercise Goals     Row Name 07/24/22 1647             Exercise Goals   Increase Physical Activity Yes       Intervention Provide advice, education, support and counseling about physical activity/exercise needs.;Develop an individualized exercise prescription for aerobic and resistive training based on initial evaluation findings, risk stratification, comorbidities and participant's personal goals.       Expected Outcomes Short Term: Attend rehab on a regular basis to increase amount of physical activity.;Long Term: Add in home exercise to make exercise part of routine and to increase amount of physical activity.;Long Term: Exercising regularly at least 3-5 days a week.       Increase Strength and Stamina Yes       Intervention Provide advice, education, support and counseling about physical activity/exercise needs.;Develop an individualized exercise prescription for aerobic and resistive training based on initial evaluation findings, risk stratification, comorbidities and participant's personal goals.       Expected Outcomes Short Term: Increase workloads from initial exercise prescription for resistance, speed, and METs.;Short Term: Perform resistance training exercises routinely during rehab and add in resistance training at home;Long Term: Improve cardiorespiratory fitness,  muscular endurance and strength as measured by increased METs and functional capacity (6MWT)       Able to understand and use rate of perceived exertion (RPE) scale Yes  Intervention Provide education and explanation on how to use RPE scale       Expected Outcomes Short Term: Able to use RPE daily in rehab to express subjective intensity level;Long Term:  Able to use RPE to guide intensity level when exercising independently       Able to understand and use Dyspnea scale Yes       Intervention Provide education and explanation on how to use Dyspnea scale       Expected Outcomes Short Term: Able to use Dyspnea scale daily in rehab to express subjective sense of shortness of breath during exertion;Long Term: Able to use Dyspnea scale to guide intensity level when exercising independently       Knowledge and understanding of Target Heart Rate Range (THRR) Yes       Intervention Provide education and explanation of THRR including how the numbers were predicted and where they are located for reference       Expected Outcomes Short Term: Able to state/look up THRR;Long Term: Able to use THRR to govern intensity when exercising independently;Short Term: Able to use daily as guideline for intensity in rehab       Able to check pulse independently Yes       Intervention Provide education and demonstration on how to check pulse in carotid and radial arteries.;Review the importance of being able to check your own pulse for safety during independent exercise       Expected Outcomes Short Term: Able to explain why pulse checking is important during independent exercise;Long Term: Able to check pulse independently and accurately       Understanding of Exercise Prescription Yes       Intervention Provide education, explanation, and written materials on patient's individual exercise prescription       Expected Outcomes Short Term: Able to explain program exercise prescription;Long Term: Able to explain home  exercise prescription to exercise independently                Exercise Goals Re-Evaluation :  Exercise Goals Re-Evaluation     Row Name 07/28/22 1035 07/31/22 1035 08/14/22 1322 08/16/22 1129 08/28/22 0924     Exercise Goal Re-Evaluation   Exercise Goals Review Able to understand and use rate of perceived exertion (RPE) scale;Able to understand and use Dyspnea scale;Knowledge and understanding of Target Heart Rate Range (THRR);Understanding of Exercise Prescription Understanding of Exercise Prescription;Increase Physical Activity;Increase Strength and Stamina Understanding of Exercise Prescription;Increase Physical Activity;Increase Strength and Stamina Understanding of Exercise Prescription;Increase Physical Activity;Increase Strength and Stamina;Able to understand and use rate of perceived exertion (RPE) scale;Knowledge and understanding of Target Heart Rate Range (THRR);Able to understand and use Dyspnea scale;Able to check pulse independently Understanding of Exercise Prescription;Increase Physical Activity;Increase Strength and Stamina   Comments Reviewed RPE  and dyspnea scale, THR and program prescription with pt today.  Pt voiced understanding and was given a copy of goals to take home. Dorie did well for her first day of rehab. She completed both the T4 and BS machines. It may appear her workloads her light due to having RPEs of 8 and 9, so we will work with patient the next couple of sessions to ensure she is working at more challenging workloads to meet more of a 11-13 RPE. HR and O2 saturations were appropriate as well. We will continue to monitor. Clorissa is doing well in rehab. She had an overall average MET level of 2.34 METs since the last evaluation. She also Improved to level  2 on the XR. She increased her speed on the treadmill to a speed of 2.3 mph as well. We will continue to monitor her progress in the program. Keena is doing well in rehab.  She has noted that walking wears her  out faster than seated exercise. We talked about weight bearing versus seated and why.  Reviewed home exercise with pt today.  Pt plans to walk and use staff videos at home for exercise.  Reviewed THR, pulse, RPE, sign and symptoms, pulse oximetery and when to call 911 or MD.  Also discussed weather considerations and indoor options.  Pt voiced understanding.  She did note that she has had some pain under her right shoulder blade.  We talked about using a ball to get it to release. Natahsa continues to do well in rehab. She has increased her speed on the treadmill to 2.5 mph. She also was able to increase her level on the XR to 6 and level 5 on the T4 Nustep, working around 4 METS! She continues to hit her THR and O2 saturations are staying well above 88%. We will continue to monitor.   Expected Outcomes Short: Use RPE and dyspnea scales daily to regulate intensity. Long: Follow program prescription in THR. Short: Continue to follow initial exercise prescription, and increase workloads when appropriate Long: Continue to increase overall stamina and MET level Short: Continue to increase treadmill workload. Long: Continue to improve strength and stamina. Short: Start to add in more exercise at home and try ball to release shoulder.  Long: conitnue to improve stamina Short: Continue to work up speed/incline on treadmill Long: Continue to increase overall MET level and stamina            Discharge Exercise Prescription (Final Exercise Prescription Changes):  Exercise Prescription Changes - 08/28/22 0900       Response to Exercise   Blood Pressure (Admit) 124/82    Blood Pressure (Exercise) 166/72    Blood Pressure (Exit) 120/72    Heart Rate (Admit) 81 bpm    Heart Rate (Exercise) 133 bpm    Heart Rate (Exit) 92 bpm    Oxygen Saturation (Admit) 98 %    Oxygen Saturation (Exercise) 95 %    Oxygen Saturation (Exit) 97 %    Rating of Perceived Exertion (Exercise) 13    Perceived Dyspnea (Exercise) 2     Symptoms SOB    Duration Progress to 30 minutes of  aerobic without signs/symptoms of physical distress    Intensity THRR unchanged      Progression   Progression Continue to progress workloads to maintain intensity without signs/symptoms of physical distress.    Average METs 2.9      Resistance Training   Training Prescription Yes    Weight 2 lb    Reps 10-15      Interval Training   Interval Training No      Treadmill   MPH 2.5    Grade 0.5    Minutes 15    METs 3.09      Recumbant Bike   Level 5    Watts 37    Minutes 15    METs 3.15      NuStep   Level 5    Minutes 15    METs 4      REL-XR   Level 6    Minutes 15      Home Exercise Plan   Plans to continue exercise at Home (comment)  walking, staff videos   Frequency Add 2 additional days to program exercise sessions.    Initial Home Exercises Provided 08/16/22      Oxygen   Maintain Oxygen Saturation 88% or higher             Nutrition:  Target Goals: Understanding of nutrition guidelines, daily intake of sodium <1535m, cholesterol <2072m calories 30% from fat and 7% or less from saturated fats, daily to have 5 or more servings of fruits and vegetables.  Education: All About Nutrition: -Group instruction provided by verbal, written material, interactive activities, discussions, models, and posters to present general guidelines for heart healthy nutrition including fat, fiber, MyPlate, the role of sodium in heart healthy nutrition, utilization of the nutrition label, and utilization of this knowledge for meal planning. Follow up email sent as well. Written material given at graduation.   Biometrics:  Pre Biometrics - 07/24/22 1647       Pre Biometrics   Height 5' 6"$  (1.676 m)    Weight 220 lb 14.4 oz (100.2 kg)    Waist Circumference 42 inches    Hip Circumference 49 inches    Waist to Hip Ratio 0.86 %    BMI (Calculated) 35.67    Single Leg Stand 1.75 seconds               Nutrition Therapy Plan and Nutrition Goals:  Nutrition Therapy & Goals - 08/17/22 0830       Nutrition Therapy   Diet Heart healthy, low Na, T2DM MNT    Drug/Food Interactions Statins/Certain Fruits    Protein (specify units) 80-90g    Fiber 25 grams    Whole Grain Foods 3 servings    Saturated Fats 14 max. grams    Fruits and Vegetables 8 servings/day    Sodium 2 grams      Personal Nutrition Goals   Nutrition Goal ST: include 1-2 more meals/snacks, drink in-between meals, pair foods with fiber, fat, and protein LT: maintain A1C <7, meet protein needs, eat 5-6 meals/snacks per day    Comments [age].o. 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 PMHx includes arthritis, CKD stg 3, T2DM, DVT, GERD, HLD, HTN, hypothyroidism, Neuro-degenerative disorders. 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 Most recent labs reviewed 08/17/22: GFR 48 (08/15/22). A1C 7.8 (05/17/22). Relevant medications reviewed 08/17/22: Zinc/Vitamin C/Collagen gummies, dicyclomine, duloxetine, folic acid, glipizide, insulin (levemir, humalog), levothyroxine, methotrexate, MVI with minerals, rosuvastatin, trazodone. DoDorotheeeports that she does not have a normal routine for eating; she reports that in the last couple of years she has had a lower appettie and has a decreased taste for many foods aside from sweets. She also also has felt that it has been hard to do go out; she lives with her son at this time and does cook for herself and her son; they will also go out to eat for fast food or restaurants. She reports being weight stable ~220 lbs. DoShaileneeports that she has had to go to a regular MVI with minerals as well as iron, B-0000000folic acid (she was just denied for a refill), Zinc/Vitamin C/Collagen gummies. She feels that her A1C is higher now as she has a continuous monitor - she feels this is due to the sweet food. She reports not eating many processed foods, but will get deli ham. Additionally, she reports not  frying food and tries to have food that is healthier when eating out. She remembers that she is supposed  to eat small and frequent meals, focusing on protein first (aiming for at least 80g/day), and not drinking with meals, however, she finds these difficult. Discussed general healthy eating, Gastric Bypass MNT, and T2DM MNT. Desirie reports that she does not like leftovers or protein shakes - discussed easy to prep meals as well as ingredient prepping to help with variety. Encouraged small frequent meals including fiber, fat, and protein.      Intervention Plan   Intervention Prescribe, educate and counsel regarding individualized specific dietary modifications aiming towards targeted core components such as weight, hypertension, lipid management, diabetes, heart failure and other comorbidities.    Expected Outcomes Short Term Goal: Understand basic principles of dietary content, such as calories, fat, sodium, cholesterol and nutrients.;Short Term Goal: A plan has been developed with personal nutrition goals set during dietitian appointment.;Long Term Goal: Adherence to prescribed nutrition plan.             Nutrition Assessments:  MEDIFICTS Score Key: ?70 Need to make dietary changes  40-70 Heart Healthy Diet ? 40 Therapeutic Level Cholesterol Diet  Flowsheet Row Pulmonary Rehab from 07/24/2022 in [facility] Card[facility] and Pulmonary Rehab  Picture Your Plate Total Score on Admission 49      Picture Your Plate Scores: [id number] Unhealthy dietary pattern with much room for improvement. 41-50 Dietary pattern unlikely to meet recommendations for good health and room for improvement. 51-60 More healthful dietary pattern, with some room for improvement.  >60 Healthy dietary pattern, although there may be some specific behaviors that could be improved.   Nutrition Goals Re-Evaluation:  Nutrition Goals Re-Evaluation     [place] Name 08/16/22 1139             Goals   Nutrition Goal Meet with dietitian        Comment Marinda would like to meet wiht dietitian now that she is back.  She is diabetic and tend to have higher potassium levels.  She would like some tips on how to combat the potassium in particular.       Expected Outcome Short: Set up appt wiht dietitian Long: Conitnue to follow diabetic healthy diet.                Nutrition Goals Discharge (Final Nutrition Goals Re-Evaluation):  Nutrition Goals Re-Evaluation - 08/16/22 1139       Goals   Nutrition Goal Meet with dietitian    Comment Tandria would like to meet wiht dietitian now that she is back.  She is diabetic and tend to have higher potassium levels.  She would like some tips on how to combat the potassium in particular.    Expected Outcome Short: Set up appt wiht dietitian Long: Conitnue to follow diabetic healthy diet.             Psychosocial: Target Goals: Acknowledge presence or absence of significant depression and/or stress, maximize coping skills, provide positive support system. Participant is able to verbalize types and ability to use techniques and skills needed for reducing stress and depression.   Education: Stress, Anxiety, and Depression - Group verbal and visual presentation to define topics covered.  Reviews how body is impacted by stress, anxiety, and depression.  Also discusses healthy ways to reduce stress and to treat/manage anxiety and depression.  Written material given at graduation.   Education: Sleep Hygiene -Provides group verbal and written instruction about how sleep can affect your health.  Define sleep hygiene, discuss sleep cycles and impact of sleep habits. Review good  sleep hygiene tips.    Initial Review & Psychosocial Screening:  Initial Psych Review & Screening - 07/24/22 1031       Initial Review   Current issues with Current Anxiety/Panic;Current Psychotropic Meds;Current Depression;History of Depression;Current Sleep Concerns      Family Dynamics   Good Support System? Yes     Comments She has lost her husband two years ago. She stays up late and has been very tired.  Her son lives with her and is a good support system for her. From time to time she will miss her husband and feel depressed.      Barriers   Psychosocial barriers to participate in program The patient should benefit from training in stress management and relaxation.      Screening Interventions   Interventions Encouraged to exercise;To provide support and resources with identified psychosocial needs;Provide feedback about the scores to participant    Expected Outcomes Short Term goal: Utilizing psychosocial counselor, staff and physician to assist with identification of specific Stressors or current issues interfering with healing process. Setting desired goal for each stressor or current issue identified.;Long Term Goal: Stressors or current issues are controlled or eliminated.;Short Term goal: Identification and review with participant of any Quality of Life or Depression concerns found by scoring the questionnaire.;Long Term goal: The participant improves quality of Life and PHQ9 Scores as seen by post scores and/or verbalization of changes             Quality of Life Scores:  Scores of 19 and below usually indicate a poorer quality of life in these areas.  A difference of  2-3 points is a clinically meaningful difference.  A difference of 2-3 points in the total score of the Quality of Life Index has been associated with significant improvement in overall quality of life, self-image, physical symptoms, and general health in studies assessing change in quality of life.  PHQ-9: Review Flowsheet       08/16/2022 07/24/2022  Depression screen PHQ 2/9  Decreased Interest 2 3  Down, Depressed, Hopeless 1 1  PHQ - 2 Score 3 4  Altered sleeping 0 3  Tired, decreased energy 3 3  Change in appetite 0 2  Feeling bad or failure about yourself  0 2  Trouble concentrating 1 3  Moving slowly or  fidgety/restless 0 1  Suicidal thoughts 0 0  PHQ-9 Score 7 18  Difficult doing work/chores Somewhat difficult Very difficult   Interpretation of Total Score  Total Score Depression Severity:  1-4 = Minimal depression, 5-9 = Mild depression, 10-14 = Moderate depression, 15-19 = Moderately severe depression, 20-27 = Severe depression   Psychosocial Evaluation and Intervention:  Psychosocial Evaluation - 07/24/22 1033       Psychosocial Evaluation & Interventions   Interventions Encouraged to exercise with the program and follow exercise prescription;Relaxation education;Stress management education    Comments She has lost her husband two years ago. She stays up late and has been very tired. Her son lives with her and is a good support system for her. From time to time she will miss her husband and feel depressed.    Expected Outcomes Short: Start [facility] to help with mood. Long: Maintain a healthy mental state.    Continue Psychosocial Services  Follow up required by staff             Psychosocial Re-Evaluation:  Psychosocial Re-Evaluation     [place] Name 08/16/22 1133  Psychosocial Re-Evaluation   Current issues with Current Depression;Current Anxiety/Panic;Current Stress Concerns;Current Sleep Concerns;History of Depression;Current Psychotropic Meds       Comments Branden is doing well in rehab.  She is feeling pretty good.  She has to fight with herself to get here each day.  Today she had a headache and blood sugar low, but she took care of it and got herself here.  Her son is still very supportive of her and gets her up and checks her sugar each day.  He continues to encourage her to go each. She misses her husband and thinks he would have enjoyed coming as well.  Her PHQ has greatly decreased.  She attributes it to gettting out and feeling better. She is still struggling with energy levels.  She does sleep well and uses her CPAP each night.  She does note that she  wakes frequently, but is able to get back to sleep most of the time.       Expected Outcomes Short: Continue to exercise for mental mood booster Long: Continue to work on sleeping better       Interventions Encouraged to attend Pulmonary Rehabilitation for the exercise;Stress management education       Continue Psychosocial Services  Follow up required by staff         Initial Review   Source of Stress Concerns Unable to participate in former interests or hobbies;Unable to perform yard/household activities;Family                Psychosocial Discharge (Final Psychosocial Re-Evaluation):  Psychosocial Re-Evaluation - 08/16/22 1133       Psychosocial Re-Evaluation   Current issues with Current Depression;Current Anxiety/Panic;Current Stress Concerns;Current Sleep Concerns;History of Depression;Current Psychotropic Meds    Comments Toral is doing well in rehab.  She is feeling pretty good.  She has to fight with herself to get here each day.  Today she had a headache and blood sugar low, but she took care of it and got herself here.  Her son is still very supportive of her and gets her up and checks her sugar each day.  He continues to encourage her to go each. She misses her husband and thinks he would have enjoyed coming as well.  Her PHQ has greatly decreased.  She attributes it to gettting out and feeling better. She is still struggling with energy levels.  She does sleep well and uses her CPAP each night.  She does note that she wakes frequently, but is able to get back to sleep most of the time.    Expected Outcomes Short: Continue to exercise for mental mood booster Long: Continue to work on sleeping better    Interventions Encouraged to attend Pulmonary Rehabilitation for the exercise;Stress management education    Continue Psychosocial Services  Follow up required by staff      Initial Review   Source of Stress Concerns Unable to participate in former interests or hobbies;Unable to  perform yard/household activities;Family             Education: Education Goals: Education classes will be provided on a weekly basis, covering required topics. Participant will state understanding/return demonstration of topics presented.  Learning Barriers/Preferences:  Learning Barriers/Preferences - 07/24/22 1030       Learning Barriers/Preferences   Learning Barriers None    Learning Preferences None             General Pulmonary Education Topics:  Infection Prevention: - Provides verbal and written  material to individual with discussion of infection control including proper hand washing and proper equipment cleaning during exercise session. Flowsheet Row Pulmonary Rehab from 08/09/2022 in [facility] Cardiac and Pulmonary Rehab  Date 07/24/22  Educator [facility]  Instruction Review Code 1- Verbalizes Understanding       Falls Prevention: - Provides verbal and written material to individual with discussion of falls prevention and safety. Flowsheet Row Pulmonary Rehab from 08/09/2022 in [facility] Cardiac and Pulmonary Rehab  Date 07/24/22  Educator [facility]  Instruction Review Code 1- Verbalizes Understanding       Chronic Lung Disease Review: - Group verbal instruction with posters, models, PowerPoint presentations and videos,  to review new updates, new respiratory medications, new advancements in procedures and treatments. Providing information on websites and "800" numbers for continued self-education. Includes information about supplement oxygen, available portable oxygen systems, continuous and intermittent flow rates, oxygen safety, concentrators, and Medicare reimbursement for oxygen. Explanation of Pulmonary Drugs, including class, frequency, complications, importance of spacers, rinsing mouth after steroid MDI's, and proper cleaning methods for nebulizers. Review of basic lung anatomy and physiology related to function, structure, and complications of lung disease. Review of risk  factors. Discussion about methods for diagnosing sleep apnea and types of masks and machines for OSA. Includes a review of the use of types of environmental controls: home humidity, furnaces, filters, dust mite/pet prevention, HEPA vacuums. Discussion about weather changes, air quality and the benefits of nasal washing. Instruction on Warning signs, infection symptoms, calling MD promptly, preventive modes, and value of vaccinations. Review of effective airway clearance, coughing and/or vibration techniques. Emphasizing that all should Create an Action Plan. Written material given at graduation. Flowsheet Row Pulmonary Rehab from 08/09/2022 in [facility] Cardiac and Pulmonary Rehab  Education need identified 07/24/22       AED/CPR: - Group verbal and written instruction with the use of models to demonstrate the basic use of the AED with the basic ABC's of resuscitation.    Anatomy and Cardiac Procedures: - Group verbal and visual presentation and models provide information about basic cardiac anatomy and function. Reviews the testing methods done to diagnose heart disease and the outcomes of the test results. Describes the treatment choices: Medical Management, Angioplasty, or Coronary Bypass Surgery for treating various heart conditions including Myocardial Infarction, Angina, Valve Disease, and Cardiac Arrhythmias.  Written material given at graduation.   Medication Safety: - Group verbal and visual instruction to review commonly prescribed medications for heart and lung disease. Reviews the medication, class of the drug, and side effects. Includes the steps to properly store meds and maintain the prescription regimen.  Written material given at graduation.   Other: -Provides group and verbal instruction on various topics (see comments)   Knowledge Questionnaire Score:  Knowledge Questionnaire Score - 07/24/22 1648       Knowledge Questionnaire Score   Pre Score 14/18               Core Components/Risk Factors/Patient Goals at Admission:  Personal Goals and Risk Factors at Admission - 07/24/22 1650       Core Components/Risk Factors/Patient Goals on Admission    Weight Management Yes;Weight Loss    Intervention Weight Management: Develop a combined nutrition and exercise program designed to reach desired caloric intake, while maintaining appropriate intake of nutrient and fiber, sodium and fats, and appropriate energy expenditure required for the weight goal.;Weight Management: Provide education and appropriate resources to help participant work on and attain dietary goals.;Obesity: Provide education and appropriate  resources to help participant work on and attain dietary goals.;Weight Management/Obesity: Establish reasonable short term and long term weight goals.    Admit Weight 220 lb 14.4 oz (100.2 kg)    Goal Weight: Short Term 210 lb (95.3 kg)    Goal Weight: Long Term 200 lb (90.7 kg)    Expected Outcomes Short Term: Continue to assess and modify interventions until short term weight is achieved;Long Term: Adherence to nutrition and physical activity/exercise program aimed toward attainment of established weight goal;Weight Loss: Understanding of general recommendations for a balanced deficit meal plan, which promotes 1-2 lb weight loss per week and includes a negative energy balance of [phone] kcal/d;Understanding recommendations for meals to include 15-35% energy as protein, 25-35% energy from fat, 35-60% energy from carbohydrates, less than 256m of dietary cholesterol, 20-35 gm of total fiber daily;Understanding of distribution of calorie intake throughout the day with the consumption of 4-5 meals/snacks    Improve shortness of breath with ADL's Yes    Intervention Provide education, individualized exercise plan and daily activity instruction to help decrease symptoms of SOB with activities of daily living.    Expected Outcomes Short Term: Improve cardiorespiratory  fitness to achieve a reduction of symptoms when performing ADLs;Long Term: Be able to perform more ADLs without symptoms or delay the onset of symptoms    Diabetes Yes    Intervention Provide education about signs/symptoms and action to take for hypo/hyperglycemia.;Provide education about proper nutrition, including hydration, and aerobic/resistive exercise prescription along with prescribed medications to achieve blood glucose in normal ranges: Fasting glucose 65-99 mg/dL    Expected Outcomes Short Term: Participant verbalizes understanding of the signs/symptoms and immediate care of hyper/hypoglycemia, proper foot care and importance of medication, aerobic/resistive exercise and nutrition plan for blood glucose control.;Long Term: Attainment of HbA1C < 7%.    Hypertension Yes    Intervention Provide education on lifestyle modifcations including regular physical activity/exercise, weight management, moderate sodium restriction and increased consumption of fresh fruit, vegetables, and low fat dairy, alcohol moderation, and smoking cessation.;Monitor prescription use compliance.    Expected Outcomes Short Term: Continued assessment and intervention until BP is < 140/965mHG in hypertensive participants. < 130/8012mG in hypertensive participants with diabetes, heart failure or chronic kidney disease.;Long Term: Maintenance of blood pressure at goal levels.    Lipids Yes    Intervention Provide education and support for participant on nutrition & aerobic/resistive exercise along with prescribed medications to achieve LDL <14m1mDL >40mg44m Expected Outcomes Short Term: Participant states understanding of desired cholesterol values and is compliant with medications prescribed. Participant is following exercise prescription and nutrition guidelines.;Long Term: Cholesterol controlled with medications as prescribed, with individualized exercise RX and with personalized nutrition plan. Value goals: LDL < 14mg,1mL > 40 mg.             Education:Diabetes - Individual verbal and written instruction to review signs/symptoms of diabetes, desired ranges of glucose level fasting, after meals and with exercise. Acknowledge that pre and post exercise glucose checks will be done for 3 sessions at entry of program. Flowsheet Row [facility]monary [facility]  Date 07/24/22  [facility] Verbalizes Understanding       Know Your Numbers and Heart Failure: - Group verbal and visual instruction to discuss disease risk factors for cardiac and pulmonary disease and treatment options.  Reviews associated critical values for Overweight/Obesity, Hypertension, Cholesterol, and Diabetes.  Discusses basics of heart failure: signs/symptoms and treatments.  Introduces Heart Failure Zone chart for action plan for heart failure.  Written material given at graduation.   Core Components/Risk Factors/Patient Goals Review:   Goals and Risk Factor Review     Row Name 08/16/22 1142             Core Components/Risk Factors/Patient Goals Review   Personal Goals Review Weight Management/Obesity;Improve shortness of breath with ADL's;Diabetes;Increase knowledge of respiratory medications and ability to use respiratory devices properly.;Hypertension       Review Omera is doing well in rehab.  Her weight has been yoyoing. She will go down 4 then up 5 then down 2 and up 3.  She is trying to find a balance and maintain the downward trend.  Her breathing id doing well and she is using her inhaler like she is supposed to.  Her sugars have been running higher than normal.  She really wants to work to get better control over her sugars and will set up an appointment with dietitan to help.  Her pressures are doing well and she does check them at home.       Expected Outcomes short: Continue to work on better blood sugar control to help with weight loss Long; continue to  monitor risk factors.                Core Components/Risk Factors/Patient Goals at Discharge (Final Review):   Goals and Risk Factor Review - 08/16/22 1142       Core Components/Risk Factors/Patient Goals Review   Personal Goals Review Weight Management/Obesity;Improve shortness of breath with ADL's;Diabetes;Increase knowledge of respiratory medications and ability to use respiratory devices properly.;Hypertension    Review Shantinique is doing well in rehab.  Her weight has been yoyoing. She will go down 4 then up 5 then down 2 and up 3.  She is trying to find a balance and maintain the downward trend.  Her breathing id doing well and she is using her inhaler like she is supposed to.  Her sugars have been running higher than normal.  She really wants to work to get better control over her sugars and will set up an appointment with dietitan to help.  Her pressures are doing well and she does check them at home.    Expected Outcomes short: Continue to work on better blood sugar control to help with weight loss Long; continue to monitor risk factors.             ITP Comments:  ITP Comments     Row Name 07/24/22 1028 07/24/22 1640 07/28/22 1035 08/02/22 0854 08/17/22 0921   ITP Comments Virtual Visit completed. Patient informed on EP and RD appointment and 6 Minute walk test. Patient also informed of patient health questionnaires on My Chart. Patient Verbalizes understanding. Visit diagnosis can be found in [facility] 07/12/22. Completed 6MWT and gym orientation. Initial ITP created and sent for review to Dr. Zetta Bills, Medical Director. First full day of exercise!  Patient was oriented to gym and equipment including functions, settings, policies, and procedures.  Patient's individual exercise prescription and treatment plan were reviewed.  All starting workloads were established based on the results of the 6 minute walk test done at initial orientation visit.  The plan for exercise progression was  also introduced and progression will be customized based on patient's performance and goals. 30 Day review completed. Medical Director ITP review done, changes made as directed, and signed approval  by Medical Director.   new to program Completed initial RD consultation    [place] Name 08/30/22 0749           ITP Comments 30 day review completed. ITP sent to Dr. Zetta Bills, Medical Director of  Pulmonary Rehab. Continue with ITP unless changes are made by physician.                Comments: 30 day review

## 2022-08-30 NOTE — Progress Notes (Signed)
Daily Session Note  Patient Details  Name: Kaitlin Kelley MRN: [id number] Date of Birth: 02/22/1951 Referring Provider:   Flowsheet Row Pulmonary Rehab from 07/24/2022 in [facility] Cardiac and Pulmonary Rehab  Referring Provider Lanney Gins       Encounter Date: 08/30/2022  Check In:  Session Check In - 08/30/22 1110       Check-In   Supervising physician immediately available to respond to emergencies See telemetry face sheet for immediately available ER MD    Location [facility]-Cardiac & Pulmonary Rehab    Staff Present Renita Papa, RN BSN;Joseph [place], RCP,RRT,BSRT;Noah Tickle, [place], Exercise Physiologist;Megan Tamala Julian, RN, [place]    Virtual Visit No    Medication changes reported     No    Fall or balance concerns reported    No    Warm-up and Cool-down Performed on first and last piece of equipment    Resistance Training Performed Yes    VAD Patient? No    PAD/SET Patient? No      Pain Assessment   Currently in Pain? No/denies                Social History   Tobacco Use  Smoking Status Never  Smokeless Tobacco Never    Goals Met:  Independence with exercise equipment Exercise tolerated well No report of concerns or symptoms today Strength training completed today  Goals Unmet:  Not Applicable  Comments: Pt able to follow exercise prescription today without complaint.  Will continue to monitor for progression.    Dr. Emily Filbert is Medical Director for [place].  Dr. Ottie Glazier is Medical Director for [facility] Pulmonary Rehabilitation.

## 2022-09-01 ENCOUNTER — Encounter: Payer: Medicare Other | Admitting: *Deleted

## 2022-09-01 DIAGNOSIS — D860 Sarcoidosis of lung: Secondary | ICD-10-CM

## 2022-09-01 NOTE — Progress Notes (Signed)
Daily Session Note  Patient Details  Name: Kaitlin Kelley MRN: [id number] Date of Birth: 1950/12/18 Referring Provider:   Flowsheet Row Pulmonary Rehab from 07/24/2022 in [facility] Cardiac and Pulmonary Rehab  Referring Provider Lanney Gins       Encounter Date: 09/01/2022  Check In:  Session Check In - 09/01/22 1119       Check-In   Supervising physician immediately available to respond to emergencies See telemetry face sheet for immediately available ER MD    Location [facility]-Cardiac & Pulmonary Rehab    Staff Present Alberteen Sam, MA, RCEP, CCRP, CCET;Joseph [place], RCP,RRT,BSRT;Other   Darel Hong, RN BSN   Virtual Visit No    Medication changes reported     No    Fall or balance concerns reported    No    Tobacco Cessation No Change    Warm-up and Cool-down Performed on first and last piece of equipment    Resistance Training Performed Yes    VAD Patient? No    PAD/SET Patient? No      Pain Assessment   Currently in Pain? No/denies                Social History   Tobacco Use  Smoking Status Never  Smokeless Tobacco Never    Goals Met:  Independence with exercise equipment Exercise tolerated well No report of concerns or symptoms today Strength training completed today  Goals Unmet:  Not Applicable  Comments: Pt able to follow exercise prescription today without complaint.  Will continue to monitor for progression.    Dr. Emily Filbert is Medical Director for [place].  Dr. Ottie Glazier is Medical Director for [facility] Pulmonary Rehabilitation.

## 2022-09-04 ENCOUNTER — Encounter: Payer: Medicare Other | Admitting: *Deleted

## 2022-09-04 DIAGNOSIS — D860 Sarcoidosis of lung: Secondary | ICD-10-CM

## 2022-09-04 NOTE — Progress Notes (Signed)
Daily Session Note  Patient Details  Name: Kaitlin Kelley MRN: [id number] Date of Birth: 1951-02-17 Referring Provider:   Flowsheet Row Pulmonary Rehab from 07/24/2022 in [facility] Cardiac and Pulmonary Rehab  Referring Provider Lanney Gins       Encounter Date: 09/04/2022  Check In:  Session Check In - 09/04/22 1110       Check-In   Supervising physician immediately available to respond to emergencies See telemetry face sheet for immediately available ER MD    Location [facility]-Cardiac & Pulmonary Rehab    Staff Present Darlyne Russian, RN, Doyce Para, BS, ACSM CEP, Exercise Physiologist;Noah Tickle, BS, Exercise Physiologist;Kara Maricela Bo, MS, ACSM CEP, Exercise Physiologist    Virtual Visit No    Medication changes reported     No    Fall or balance concerns reported    No    Warm-up and Cool-down Performed on first and last piece of equipment    Resistance Training Performed Yes    VAD Patient? No    PAD/SET Patient? No      Pain Assessment   Currently in Pain? No/denies                Social History   Tobacco Use  Smoking Status Never  Smokeless Tobacco Never    Goals Met:  Independence with exercise equipment Exercise tolerated well No report of concerns or symptoms today Strength training completed today  Goals Unmet:  Not Applicable  Comments: Pt able to follow exercise prescription today without complaint.  Will continue to monitor for progression.    Dr. Emily Filbert is Medical Director for [place].  Dr. Ottie Glazier is Medical Director for [facility] Pulmonary Rehabilitation.

## 2022-09-08 ENCOUNTER — Encounter: Payer: Medicare Other | Attending: Pulmonary Disease | Admitting: *Deleted

## 2022-09-08 DIAGNOSIS — D860 Sarcoidosis of lung: Secondary | ICD-10-CM | POA: Insufficient documentation

## 2022-09-08 NOTE — Progress Notes (Signed)
Daily Session Note  Patient Details  Name: Kaitlin Kelley MRN: [id number] Date of Birth: [birth year] Referring Provider:   Flowsheet Row Pulmonary Rehab from 07/24/2022 in [facility] Cardiac and Pulmonary Rehab  Referring Provider Lanney Gins       Encounter Date: 09/08/2022  Check In:  Session Check In - 09/08/22 1117       Check-In   Supervising physician immediately available to respond to emergencies See telemetry face sheet for immediately available ER MD    Location [facility]-Cardiac & Pulmonary Rehab    Staff Present Darlyne Russian, RN, ADN;Jessica Luan Pulling, MA, RCEP, CCRP, CCET;Joseph [place], [place]    Virtual Visit No    Medication changes reported     No    Fall or balance concerns reported    No    Warm-up and Cool-down Performed on first and last piece of equipment    Resistance Training Performed Yes    VAD Patient? No    PAD/SET Patient? No      Pain Assessment   Currently in Pain? No/denies                Social History   Tobacco Use  Smoking Status Never  Smokeless Tobacco Never    Goals Met:  Independence with exercise equipment Exercise tolerated well No report of concerns or symptoms today Strength training completed today  Goals Unmet:  Not Applicable  Comments: Pt able to follow exercise prescription today without complaint.  Will continue to monitor for progression.    Dr. Emily Filbert is Medical Director for [place].  Dr. Ottie Glazier is Medical Director for [facility] Pulmonary Rehabilitation.

## 2022-09-11 ENCOUNTER — Encounter: Payer: Medicare Other | Admitting: *Deleted

## 2022-09-13 ENCOUNTER — Encounter: Payer: Medicare Other | Admitting: *Deleted

## 2022-09-13 DIAGNOSIS — D860 Sarcoidosis of lung: Secondary | ICD-10-CM

## 2022-09-13 NOTE — Progress Notes (Signed)
Daily Session Note  Patient Details  Name: Kaitlin Kelley MRN: [id number] Date of Birth: 05-Jan-1951 Referring Provider:   Flowsheet Row Pulmonary Rehab from 07/24/2022 in [facility] Cardiac and Pulmonary Rehab  Referring Provider Lanney Gins       Encounter Date: 09/13/2022  Check In:  Session Check In - 09/13/22 1055       Check-In   Staff Present Darlyne Russian, RN, ADN;Jessica Luan [place]ing, MA, RCEP, CCRP, CCET;Noah Tickle, BS, Exercise Physiologist;Meredith Sherryll Burger, RN BSN    Virtual Visit No    Medication changes reported     No    Fall or balance concerns reported    No    Warm-up and Cool-down Performed on first and last piece of equipment    Resistance Training Performed Yes    VAD Patient? No    PAD/SET Patient? No      Pain Assessment   Currently in Pain? No/denies                Social History   Tobacco Use  Smoking Status Never  Smokeless Tobacco Never    Goals Met:  Independence with exercise equipment Exercise tolerated well No report of concerns or symptoms today Strength training completed today  Goals Unmet:  Not Applicable  Comments: Pt able to follow exercise prescription today without complaint.  Will continue to monitor for progression.    Dr. Emily Filbert is Medical Director for [place].  Dr. Ottie Glazier is Medical Director for [facility] Pulmonary Rehabilitation.

## 2022-09-15 ENCOUNTER — Encounter: Payer: Medicare Other | Admitting: *Deleted

## 2022-09-15 DIAGNOSIS — D860 Sarcoidosis of lung: Secondary | ICD-10-CM

## 2022-09-15 NOTE — Progress Notes (Signed)
Daily Session Note  Patient Details  Name: Kaitlin Kelley MRN: [id number] Date of Birth: April 06, 1951 Referring Provider:   Flowsheet Row Pulmonary Rehab from 07/24/2022 in [facility] Cardiac and Pulmonary Rehab  Referring Provider Lanney Gins       Encounter Date: 09/15/2022  Check In:  Session Check In - 09/15/22 1143       Check-In   Supervising physician immediately available to respond to emergencies See telemetry face sheet for immediately available ER MD    Location [facility]-Cardiac & Pulmonary Rehab    Staff Present Heath Lark, RN, BSN, CCRP;Jessica [place], MA, RCEP, CCRP, CCET;Joseph [place], [place]    Virtual Visit No    Medication changes reported     No    Fall or balance concerns reported    No    Warm-up and Cool-down Performed on first and last piece of equipment    Resistance Training Performed Yes    VAD Patient? No    PAD/SET Patient? No      Pain Assessment   Currently in Pain? No/denies                Social History   Tobacco Use  Smoking Status Never  Smokeless Tobacco Never    Goals Met:  Proper associated with RPD/PD & O2 Sat Independence with exercise equipment Exercise tolerated well No report of concerns or symptoms today  Goals Unmet:  Not Applicable  Comments: Pt able to follow exercise prescription today without complaint.  Will continue to monitor for progression.    Dr. Emily Filbert is Medical Director for [place].  Dr. Ottie Glazier is Medical Director for [facility] Pulmonary Rehabilitation.

## 2022-09-18 ENCOUNTER — Encounter: Payer: Medicare Other | Admitting: *Deleted

## 2022-09-18 DIAGNOSIS — D860 Sarcoidosis of lung: Secondary | ICD-10-CM

## 2022-09-18 NOTE — Progress Notes (Signed)
Daily Session Note  Patient Details  Name: Kaitlin Kelley MRN: [id number] Date of Birth: December 21, 1950 Referring Provider:   Flowsheet Row Pulmonary Rehab from 07/24/2022 in [facility] Cardiac and Pulmonary Rehab  Referring Provider Lanney Gins       Encounter Date: 09/18/2022  Check In:  Session Check In - 09/18/22 1116       Check-In   Supervising physician immediately available to respond to emergencies See telemetry face sheet for immediately available ER MD    Location [facility]-Cardiac & Pulmonary Rehab    Staff Present Darlyne Russian, RN, Doyce Para, BS, ACSM CEP, Exercise Physiologist;Meredith Sherryll Burger, RN BSN;Noah Tickle, BS, Exercise Physiologist    Virtual Visit No    Medication changes reported     No    Fall or balance concerns reported    No    Warm-up and Cool-down Performed on first and last piece of equipment    Resistance Training Performed Yes    VAD Patient? No    PAD/SET Patient? No      Pain Assessment   Currently in Pain? No/denies                Social History   Tobacco Use  Smoking Status Never  Smokeless Tobacco Never    Goals Met:  Independence with exercise equipment Exercise tolerated well No report of concerns or symptoms today Strength training completed today  Goals Unmet:  Not Applicable  Comments: Pt able to follow exercise prescription today without complaint.  Will continue to monitor for progression.    Dr. Emily Filbert is Medical Director for [place].  Dr. Ottie Glazier is Medical Director for [facility] Pulmonary Rehabilitation.

## 2022-09-20 ENCOUNTER — Encounter: Payer: Medicare Other | Admitting: *Deleted

## 2022-09-20 DIAGNOSIS — D860 Sarcoidosis of lung: Secondary | ICD-10-CM | POA: Diagnosis not present

## 2022-09-20 NOTE — Progress Notes (Signed)
Daily Session Note  Patient Details  Name: Kaitlin Kelley MRN: [id number] Date of Birth: Jul 07, 1951 Referring Provider:   Flowsheet Row Pulmonary Rehab from 07/24/2022 in [facility] Cardiac and Pulmonary Rehab  Referring Provider Lanney Gins       Encounter Date: 09/20/2022  Check In:  Session Check In - 09/20/22 1126       Check-In   Supervising physician immediately available to respond to emergencies See telemetry face sheet for immediately available ER MD    Location [facility]-Cardiac & Pulmonary Rehab    Staff Present Darlyne Russian, RN, ADN;Meredith Sherryll Burger, RN BSN;Joseph [place], RCP,RRT,BSRT;Noah Tickle, BS, Exercise Physiologist    Virtual Visit No    Medication changes reported     No    Fall or balance concerns reported    No    Warm-up and Cool-down Performed on first and last piece of equipment    Resistance Training Performed Yes    VAD Patient? No    PAD/SET Patient? No      Pain Assessment   Currently in Pain? No/denies                Social History   Tobacco Use  Smoking Status Never  Smokeless Tobacco Never    Goals Met:  Independence with exercise equipment Exercise tolerated well No report of concerns or symptoms today Strength training completed today  Goals Unmet:  Not Applicable  Comments: Pt able to follow exercise prescription today without complaint.  Will continue to monitor for progression.    Dr. Emily Filbert is Medical Director for [place].  Dr. Ottie Glazier is Medical Director for [facility] Pulmonary Rehabilitation.

## 2022-09-22 ENCOUNTER — Encounter: Payer: Medicare Other | Admitting: *Deleted

## 2022-09-22 DIAGNOSIS — D860 Sarcoidosis of lung: Secondary | ICD-10-CM | POA: Diagnosis not present

## 2022-09-22 NOTE — Progress Notes (Signed)
Daily Session Note  Patient Details  Name: Kaitlin Kelley MRN: [id number] Date of Birth: August 02, 1950 Referring Provider:   Flowsheet Row Pulmonary Rehab from 07/24/2022 in [facility] Cardiac and Pulmonary Rehab  Referring Provider Lanney Gins       Encounter Date: 09/22/2022  Check In:  Session Check In - 09/22/22 1009       Check-In   Supervising physician immediately available to respond to emergencies See telemetry face sheet for immediately available ER MD    Location [facility]-Cardiac & Pulmonary Rehab    Staff Present Heath Lark, RN, BSN, CCRP;Jessica [place], MA, RCEP, CCRP, CCET;Joseph [place], [place]    Virtual Visit No    Medication changes reported     No    Fall or balance concerns reported    No    Warm-up and Cool-down Performed on first and last piece of equipment    Resistance Training Performed Yes    VAD Patient? No    PAD/SET Patient? No      Pain Assessment   Currently in Pain? No/denies                Social History   Tobacco Use  Smoking Status Never  Smokeless Tobacco Never    Goals Met:  Proper associated with RPD/PD & O2 Sat Independence with exercise equipment Exercise tolerated well No report of concerns or symptoms today  Goals Unmet:  Not Applicable  Comments: Pt able to follow exercise prescription today without complaint.  Will continue to monitor for progression.    Dr. Emily Filbert is Medical Director for [place].  Dr. Ottie Glazier is Medical Director for [facility] Pulmonary Rehabilitation.

## 2022-09-25 ENCOUNTER — Encounter: Payer: Medicare Other | Admitting: *Deleted

## 2022-09-25 DIAGNOSIS — D860 Sarcoidosis of lung: Secondary | ICD-10-CM

## 2022-09-25 NOTE — Progress Notes (Signed)
Daily Session Note  Patient Details  Name: Kaitlin Kelley MRN: [id number] Date of Birth: 01-28-1951 Referring Provider:   Flowsheet Row Pulmonary Rehab from 07/24/2022 in [facility] Cardiac and Pulmonary Rehab  Referring Provider Lanney Gins       Encounter Date: 09/25/2022  Check In:  Session Check In - 09/25/22 1129       Check-In   Supervising physician immediately available to respond to emergencies See telemetry face sheet for immediately available ER MD    Location [facility]-Cardiac & Pulmonary Rehab    Staff Present Darlyne Russian, RN, Doyce Para, BS, ACSM CEP, Exercise Physiologist;Meredith Sherryll Burger, RN BSN;Noah Tickle, BS, Exercise Physiologist    Virtual Visit No    Medication changes reported     No    Fall or balance concerns reported    No    Warm-up and Cool-down Performed on first and last piece of equipment    Resistance Training Performed Yes    VAD Patient? No    PAD/SET Patient? No      Pain Assessment   Currently in Pain? No/denies                Social History   Tobacco Use  Smoking Status Never  Smokeless Tobacco Never    Goals Met:  Independence with exercise equipment Exercise tolerated well No report of concerns or symptoms today Strength training completed today  Goals Unmet:  Not Applicable  Comments: Pt able to follow exercise prescription today without complaint.  Will continue to monitor for progression.    Dr. Emily Filbert is Medical Director for [place].  Dr. Ottie Glazier is Medical Director for [facility] Pulmonary Rehabilitation.

## 2022-09-27 ENCOUNTER — Encounter: Payer: Self-pay | Admitting: *Deleted

## 2022-09-27 ENCOUNTER — Encounter: Payer: Medicare Other | Admitting: *Deleted

## 2022-09-27 DIAGNOSIS — D860 Sarcoidosis of lung: Secondary | ICD-10-CM | POA: Diagnosis not present

## 2022-09-27 NOTE — Progress Notes (Signed)
Pulmonary Individual Treatment Plan  Patient Details  Name: Kaitlin Kelley MRN: [id number] Date of Birth: 29-Dec-1950 Referring Provider:   Flowsheet Row Pulmonary Rehab from 07/24/2022 in [facility] Cardiac and Pulmonary Rehab  Referring Provider Lanney Gins       Initial Encounter Date:  Flowsheet Row Pulmonary Rehab from 07/24/2022 in 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 , Disp: , Rfl:    azelastine (ASTELIN) 0.1 % nasal spray, Place 1 spray into both nostrils as needed for allergies., Disp: , Rfl:    Bacillus Coagulans-Inulin (ALIGN PREBIOTIC-PROBIOTIC PO), Take 1 capsule by mouth daily., Disp: , Rfl:    calcium-vitamin D (OSCAL WITH D) 500-5 MG-MCG tablet, Take 1 tablet by mouth. 1083m, Disp: , Rfl:    Cholecalciferol 50 MCG (2000 UT) CAPS, Take 2,000 Units by mouth daily. , Disp: , Rfl:    Collagen-Vitamin C-Biotin (COLLAGEN 1500/C) 500-50-0.8 MG CAPS, daily., Disp: , Rfl:    cyanocobalamin 100 MCG tablet, Take by mouth., Disp: , Rfl:    Cyanocobalamin 3000 MCG CAPS, Take 3,000 mcg by mouth daily. , Disp: , Rfl:    dicyclomine (BENTYL) 10 MG capsule, Take 10 mg by mouth 4 (four) times daily as needed for spasms. , Disp: , Rfl:    dorzolamide (TRUSOPT) 2 % ophthalmic solution, Place 1 drop into both eyes in the morning and at bedtime. , Disp: , Rfl:    Dulaglutide (TRULICITY) 1.5 M0000000SOPN, Inject 0.5 mLs into the skin once a week. (Patient not taking: Reported on 02/19/2020), Disp: , Rfl:    DULoxetine (CYMBALTA) 60 MG capsule, Take 60 mg by mouth 2 (two) times daily. , Disp: , Rfl:    ferrous gluconate (FERGON) 240 (27 FE) MG tablet, Take by mouth., Disp: , Rfl:    folic acid (FOLVITE) 1 MG tablet, Take 1 mg by mouth daily., Disp: , Rfl:    gabapentin (NEURONTIN) 300 MG capsule,  Take 300 mg by mouth 3 (three) times daily., Disp: , Rfl:    glipiZIDE (GLUCOTROL) 10 MG tablet, Take 10 mg by mouth in the morning and at bedtime. , Disp: , Rfl:    insulin detemir (LEVEMIR) 100 UNIT/ML injection, Inject 30 Units into the skin at bedtime. , Disp: , Rfl:    Insulin Glargine-Lixisenatide (SOLIQUA) 100-33 UNT-MCG/ML SOPN, Inject 30 Units into the skin at bedtime., Disp: , Rfl:    Lactobacillus 0.05-0.05 MG TABS, Take by mouth., Disp: , Rfl:    Lancets 28G MISC, use to check blood sugar three to four times a day or as directed (Patient not taking: Reported on 07/24/2022), Disp: , Rfl:    levothyroxine (SYNTHROID) 75 MCG tablet, Take 1 tablet by mouth daily. (Patient not taking: Reported on 07/24/2022), Disp: , Rfl:    levothyroxine (SYNTHROID, LEVOTHROID) 75 MCG tablet, Take 75 mcg by mouth See admin instructions. Take 75 mcg by mouth daily before breakfast Monday through Saturday (Patient not taking: Reported on 07/24/2022), Disp: , Rfl:    losartan (COZAAR) 50 MG tablet, Take 50 mg by mouth daily., Disp: , Rfl:    methotrexate (RHEUMATREX) 2.5 MG tablet, TAKE 3 TABLETS (7.5 MG TOTAL) BY MOUTH EVERY 7 (SEVEN) DAYS, Disp: , Rfl:    mirtazapine (REMERON) 15 MG tablet, Take 15 mg by mouth at bedtime., Disp: ,  Rfl:    modafinil (PROVIGIL) 100 MG tablet, Take by mouth., Disp: , Rfl:    Multiple Vitamin tablet, Take 1 tablet by mouth daily., Disp: , Rfl:    Multiple Vitamins-Minerals (AIRBORNE PO), Take 2 tablets by mouth in the morning and at bedtime., Disp: , Rfl:    rosuvastatin (CRESTOR) 40 MG tablet, Take 40 mg by mouth at bedtime.  (Patient not taking: Reported on 07/24/2022), Disp: , Rfl:    rosuvastatin (CRESTOR) 40 MG tablet, Take 1 tablet by mouth daily., Disp: , Rfl:    sulfaSALAzine (AZULFIDINE) 500 MG tablet, Take 1,000 mg by mouth 2 (two) times daily., Disp: , Rfl:    sulfaSALAzine (AZULFIDINE) 500 MG tablet, Take 2 tablets by mouth 2 (two) times daily. (Patient not taking:  Reported on 07/24/2022), Disp: , Rfl:    TURMERIC PO, Take 1 capsule by mouth daily. , Disp: , Rfl:   Past Medical History: Past Medical History:  Diagnosis Date   Arthritis    Chronic cough    Depression    Diabetes mellitus without complication (HCC)    Edema    LEGS/FEET   GERD (gastroesophageal reflux disease)    NO MEDS   Headache    H/O MIGRAINES   HOH (hard of hearing)    Hypertension    Hypothyroidism    IBS (irritable bowel syndrome)    Sleep apnea    CPAP    Tobacco Use: Social History   Tobacco Use  Smoking Status Never  Smokeless Tobacco Never    Labs: Review Flowsheet       Latest Ref Rng & Units 02/25/2018  Labs for ITP Cardiac and Pulmonary Rehab  Hemoglobin A1c 4.8 - 5.6 % 9.2      Pulmonary Assessment Scores:  Pulmonary Assessment Scores     Row Name 07/24/22 1652         ADL UCSD   ADL Phase Entry     SOB Score total 56     Rest 0     Walk 1     Stairs 2     Bath 0     Dress 1     Shop 5       CAT Score   CAT Score 18       mMRC Score   mMRC Score 2              UCSD: Self-administered rating of dyspnea associated with activities of daily living (ADLs) 6-point scale (0 = "not at all" to 5 = "maximal or unable to do because of breathlessness")  Scoring Scores range from 0 to 120.  Minimally important difference is 5 units  CAT: CAT can identify the health impairment of COPD patients and is better correlated with disease progression.  CAT has a scoring range of zero to 40. The CAT score is classified into four groups of low (less than 10), medium (10 - 20), high (21-30) and very high (31-40) based on the impact level of disease on health status. A CAT score over 10 suggests significant symptoms.  A worsening CAT score could be explained by an exacerbation, poor medication adherence, poor inhaler technique, or progression of COPD or comorbid conditions.  CAT MCID is 2 points  mMRC: mMRC (Modified Medical Research Council)  Dyspnea Scale is used to assess the degree of baseline functional disability in patients of respiratory disease due to dyspnea. No minimal important difference is established. A decrease in score of 1 point or  greater is considered a positive change.   Pulmonary Function Assessment:  Pulmonary Function Assessment - 07/24/22 1029       Breath   Shortness of Breath Yes;Limiting activity             Exercise Target Goals: Exercise Program Goal: Individual exercise prescription set using results from initial 6 min walk test and THRR while considering  patient's activity barriers and safety.   Exercise Prescription Goal: Initial exercise prescription builds to 30-45 minutes a day of aerobic activity, 2-3 days per week.  Home exercise guidelines will be given to patient during program as part of exercise prescription that the participant will acknowledge.  Education: Aerobic Exercise: - Group verbal and visual presentation on the components of exercise prescription. Introduces F.I.T.T principle from ACSM for exercise prescriptions.  Reviews F.I.T.T. principles of aerobic exercise including progression. Written material given at graduation.   Education: Resistance Exercise: - Group verbal and visual presentation on the components of exercise prescription. Introduces F.I.T.T principle from ACSM for exercise prescriptions  Reviews F.I.T.T. principles of resistance exercise including progression. Written material given at graduation. Flowsheet Row Pulmonary Rehab from 09/20/2022 in [facility] Cardiac and Pulmonary Rehab  Date 08/02/22  Educator [facility]  Instruction Review Code 1- Verbalizes Understanding        Education: Exercise & Equipment Safety: - Individual verbal instruction and demonstration of equipment use and safety with use of the equipment. Flowsheet Row Pulmonary Rehab from 09/20/2022 in [facility] Cardiac and Pulmonary Rehab  Date 07/24/22  Educator [facility]  Instruction Review Code 1-  Verbalizes Understanding       Education: Exercise Physiology & General Exercise Guidelines: - Group verbal and written instruction with models to review the exercise physiology of the cardiovascular system and associated critical values. Provides general exercise guidelines with specific guidelines to those with heart or lung disease.    Education: Flexibility, Balance, Mind/Body Relaxation: - Group verbal and visual presentation with interactive activity on the components of exercise prescription. Introduces F.I.T.T principle from ACSM for exercise prescriptions. Reviews F.I.T.T. principles of flexibility and balance exercise training including progression. Also discusses the mind body connection.  Reviews various relaxation techniques to help reduce and manage stress (i.e. Deep breathing, progressive muscle relaxation, and visualization). Balance handout provided to take home. Written material given at graduation. Flowsheet Row Pulmonary Rehab from 09/20/2022 in [facility] Cardiac and Pulmonary Rehab  Date 08/09/22  Educator [facility]  Instruction Review Code 1- Verbalizes Understanding       Activity Barriers & Risk Stratification:  Activity Barriers & Cardiac Risk Stratification - 07/24/22 1643       Activity Barriers & Cardiac Risk Stratification   Activity Barriers Back Problems;Neck/Spine Problems             6 Minute Walk:  6 Minute Walk     Row Name 07/24/22 1640         6 Minute Walk   Phase Initial     Distance 1215 feet     Walk Time 6 minutes     # of Rest Breaks 0     MPH 2.3     METS 2.15     RPE 13     Perceived Dyspnea  1     VO2 Peak 7.54     Symptoms No     Resting HR 73 bpm     Resting BP 132/82     Resting Oxygen Saturation  96 %     Exercise Oxygen Saturation  during  6 min walk 93 %     Max Ex. HR 98 bpm     Max Ex. BP 140/80     2 Minute Post BP 128/70       Interval HR   1 Minute HR 81     2 Minute HR 95     3 Minute HR 98     4 Minute HR 97      5 Minute HR 97     6 Minute HR 95     2 Minute Post HR 69     Interval Heart Rate? Yes       Interval Oxygen   Interval Oxygen? Yes     Baseline Oxygen Saturation % 96 %     1 Minute Oxygen Saturation % 95 %     1 Minute Liters of Oxygen 0 L     2 Minute Oxygen Saturation % 93 %     2 Minute Liters of Oxygen 0 L     3 Minute Oxygen Saturation % 95 %     3 Minute Liters of Oxygen 0 L     4 Minute Oxygen Saturation % 96 %     4 Minute Liters of Oxygen 0 L     5 Minute Oxygen Saturation % 95 %     5 Minute Liters of Oxygen 0 L     6 Minute Oxygen Saturation % 96 %     6 Minute Liters of Oxygen 0 L     2 Minute Post Oxygen Saturation % 95 %     2 Minute Post Liters of Oxygen 0 L             Oxygen Initial Assessment:  Oxygen Initial Assessment - 07/24/22 1655       Home Oxygen   Home Oxygen Device None    Sleep Oxygen Prescription CPAP    Home Exercise Oxygen Prescription None    Home Resting Oxygen Prescription None    Compliance with Home Oxygen Use Yes      Initial 6 min Walk   Oxygen Used None      Program Oxygen Prescription   Program Oxygen Prescription None      Intervention   Short Term Goals To learn and exhibit compliance with exercise, home and travel O2 prescription;To learn and understand importance of maintaining oxygen saturations>88%;To learn and demonstrate proper use of respiratory medications;To learn and demonstrate proper pursed lip breathing techniques or other breathing techniques. ;To learn and understand importance of monitoring SPO2 with pulse oximeter and demonstrate accurate use of the pulse oximeter.    Long  Term Goals Exhibits compliance with exercise, home  and travel O2 prescription;Verbalizes importance of monitoring SPO2 with pulse oximeter and return demonstration;Exhibits proper breathing techniques, such as pursed lip breathing or other method taught during program session;Maintenance of O2 saturations>88%;Compliance with respiratory  medication;Demonstrates proper use of MDI's             Oxygen Re-Evaluation:  Oxygen Re-Evaluation     Row Name 07/28/22 1036 08/09/22 1151 08/16/22 1146 09/13/22 1130       Program Oxygen Prescription   Program Oxygen Prescription None None None None      Home Oxygen   Home Oxygen Device None None None None    Sleep Oxygen Prescription None None CPAP;None CPAP;None    Home Exercise Oxygen Prescription None None None None    Home Resting Oxygen Prescription None None None None  Compliance with Home Oxygen Use -- -- Yes Yes      Goals/Expected Outcomes   Short Term Goals To learn and demonstrate proper pursed lip breathing techniques or other breathing techniques.  To learn and demonstrate proper pursed lip breathing techniques or other breathing techniques.  To learn and demonstrate proper pursed lip breathing techniques or other breathing techniques. ;To learn and understand importance of monitoring SPO2 with pulse oximeter and demonstrate accurate use of the pulse oximeter.;To learn and understand importance of maintaining oxygen saturations>88%;To learn and demonstrate proper use of respiratory medications To learn and demonstrate proper pursed lip breathing techniques or other breathing techniques. ;To learn and understand importance of monitoring SPO2 with pulse oximeter and demonstrate accurate use of the pulse oximeter.;To learn and understand importance of maintaining oxygen saturations>88%;To learn and demonstrate proper use of respiratory medications    Long  Term Goals Exhibits proper breathing techniques, such as pursed lip breathing or other method taught during program session Exhibits proper breathing techniques, such as pursed lip breathing or other method taught during program session Exhibits proper breathing techniques, such as pursed lip breathing or other method taught during program session;Maintenance of O2 saturations>88%;Verbalizes importance of monitoring SPO2  with pulse oximeter and return demonstration;Compliance with respiratory medication;Demonstrates proper use of MDI's Exhibits proper breathing techniques, such as pursed lip breathing or other method taught during program session;Maintenance of O2 saturations>88%;Verbalizes importance of monitoring SPO2 with pulse oximeter and return demonstration;Compliance with respiratory medication;Demonstrates proper use of MDI's    Comments Reviewed PLB technique with pt.  Talked about how it works and it's importance in maintaining their exercise saturations. Diaphragmatic and PLB breathing explained and performed with patient. Patient has a better understanding of how to do these exercises to help with breathing performance and relaxation. Patient performed breathing techniques adequately and to practice further at home. Kailani is doing well in rehab. She is doing better with her breathing. She tries to ues her PLB.  She is compliant with her CPAP each night.  She does check her saturations at home and has been doing well. She is also good about using her inhaler each day. Maggy is doing well in rehab. She states that she feels she is doing better with her breathing overall. We reviewed PLB and she reports that she has been practicing. She has been using her CPAP most nights as well. She has not been checking her saturations at home, but we encouraged her to do so especially with beginning to walk at home.    Goals/Expected Outcomes Short: Become more profiecient at using PLB. Long: Become independent at using PLB. Short: practice PLB and diaphragmatic breathing at home. Long: Use PLB and diaphragmatic breathing independently post [employer]. Short: Continue to practice PLB to get better Long: continued compliance with CPAP Short: Continue to practice PLB and begin to check saturations at home. Long: Continued compliance with CPAP.             Oxygen Discharge (Final Oxygen Re-Evaluation):  Oxygen Re-Evaluation -  09/13/22 1130       Program Oxygen Prescription   Program Oxygen Prescription None      Home Oxygen   Home Oxygen Device None    Sleep Oxygen Prescription CPAP;None    Home Exercise Oxygen Prescription None    Home Resting Oxygen Prescription None    Compliance with Home Oxygen Use Yes      Goals/Expected Outcomes   Short Term Goals To learn and demonstrate proper pursed lip  breathing techniques or other breathing techniques. ;To learn and understand importance of monitoring SPO2 with pulse oximeter and demonstrate accurate use of the pulse oximeter.;To learn and understand importance of maintaining oxygen saturations>88%;To learn and demonstrate proper use of respiratory medications    Long  Term Goals Exhibits proper breathing techniques, such as pursed lip breathing or other method taught during program session;Maintenance of O2 saturations>88%;Verbalizes importance of monitoring SPO2 with pulse oximeter and return demonstration;Compliance with respiratory medication;Demonstrates proper use of MDI's    Comments Ellen is doing well in rehab. She states that she feels she is doing better with her breathing overall. We reviewed PLB and she reports that she has been practicing. She has been using her CPAP most nights as well. She has not been checking her saturations at home, but we encouraged her to do so especially with beginning to walk at home.    Goals/Expected Outcomes Short: Continue to practice PLB and begin to check saturations at home. Long: Continued compliance with CPAP.             Initial Exercise Prescription:  Initial Exercise Prescription - 07/24/22 1600       Date of Initial Exercise RX and Referring Provider   Date 07/24/22    Referring Provider Aleskerov      Oxygen   Maintain Oxygen Saturation 88% or higher      Treadmill   MPH 1.5    Grade 1    Minutes 15    METs 2.35      NuStep   Level 2    SPM 80    Minutes 15    METs 2.15      REL-XR   Level 2     Speed 50    Minutes 15    METs 2.15      Biostep-RELP   Level 2    SPM 50    Minutes 15    METs 2.15      Intensity   THRR 40-80% of Max Heartrate 102-132    Ratings of Perceived Exertion 11-13    Perceived Dyspnea 0-4      Progression   Progression Continue to progress workloads to maintain intensity without signs/symptoms of physical distress.      Resistance Training   Training Prescription Yes    Weight 2    Reps 10-15             Perform Capillary Blood Glucose checks as needed.  Exercise Prescription Changes:   Exercise Prescription Changes     Row Name 07/24/22 1600 07/31/22 1000 08/14/22 1300 08/16/22 1100 08/28/22 0900     Response to Exercise   Blood Pressure (Admit) 132/82 140/60 110/62 -- 124/82   Blood Pressure (Exercise) 140/80 132/62 144/68 -- 166/72   Blood Pressure (Exit) 128/70 124/72 102/62 -- 120/72   Heart Rate (Admit) 73 bpm 83 bpm 92 bpm -- 81 bpm   Heart Rate (Exercise) 98 bpm 93 bpm 109 bpm -- 133 bpm   Heart Rate (Exit) 69 bpm 85 bpm 75 bpm -- 92 bpm   Oxygen Saturation (Admit) 96 % 95 % 96 % -- 98 %   Oxygen Saturation (Exercise) 93 % 96 % 94 % -- 95 %   Oxygen Saturation (Exit) 95 % 95 % 96 % -- 97 %   Rating of Perceived Exertion (Exercise) 13 9 16  -- 13   Perceived Dyspnea (Exercise) 1 -- -- -- 2   Symptoms 3/10 left hip pain none  none -- SOB   Comments 6 MWT results 1st full day of exercise -- -- --   Duration -- Progress to 30 minutes of  aerobic without signs/symptoms of physical distress Continue with 30 min of aerobic exercise without signs/symptoms of physical distress. -- Progress to 30 minutes of  aerobic without signs/symptoms of physical distress   Intensity -- THRR unchanged THRR unchanged -- THRR unchanged     Progression   Progression -- Continue to progress workloads to maintain intensity without signs/symptoms of physical distress. Continue to progress workloads to maintain intensity without signs/symptoms of  physical distress. -- Continue to progress workloads to maintain intensity without signs/symptoms of physical distress.   Average METs -- 2.5 2.34 -- 2.9     Resistance Training   Training Prescription -- Yes Yes -- Yes   Weight -- 2 lb 2 lb -- 2 lb   Reps -- 10-15 10-15 -- 10-15     Interval Training   Interval Training -- No No -- No     Treadmill   MPH -- -- 2.3 -- 2.5   Grade -- -- 0 -- 0.5   Minutes -- -- 15 -- 15   METs -- -- -- -- 3.09     Recumbant Bike   Level -- -- -- -- 5   Watts -- -- -- -- 37   Minutes -- -- -- -- 15   METs -- -- -- -- 3.15     NuStep   Level -- 2 2 -- 5   Minutes -- 15 15 -- 15   METs -- -- 2.8 -- 4     REL-XR   Level -- -- 2 -- 6   Minutes -- -- 15 -- 15   METs -- -- 2 -- --     Biostep-RELP   Level -- 2 -- -- --   Minutes -- 15 -- -- --   METs -- 3 -- -- --     Home Exercise Plan   Plans to continue exercise at -- -- -- Home (comment)  walking, staff videos Home (comment)  walking, staff videos   Frequency -- -- -- Add 2 additional days to program exercise sessions. Add 2 additional days to program exercise sessions.   Initial Home Exercises Provided -- -- -- 08/16/22 08/16/22     Oxygen   Maintain Oxygen Saturation -- 88% or higher 88% or higher -- 88% or higher    Row Name 09/11/22 1300 09/25/22 0900           Response to Exercise   Blood Pressure (Admit) 126/62 114/64      Blood Pressure (Exit) 122/62 124/62      Heart Rate (Admit) 64 bpm 85 bpm      Heart Rate (Exercise) 124 bpm 103 bpm      Heart Rate (Exit) 96 bpm 83 bpm      Oxygen Saturation (Admit) 95 % 97 %      Oxygen Saturation (Exercise) 93 % 94 %      Oxygen Saturation (Exit) 98 % 96 %      Rating of Perceived Exertion (Exercise) 12 13      Perceived Dyspnea (Exercise) 2 --      Symptoms SOB none      Duration Progress to 30 minutes of  aerobic without signs/symptoms of physical distress Continue with 30 min of aerobic exercise without signs/symptoms of  physical distress.      Intensity THRR unchanged THRR  unchanged        Progression   Progression Continue to progress workloads to maintain intensity without signs/symptoms of physical distress. Continue to progress workloads to maintain intensity without signs/symptoms of physical distress.      Average METs 3.21 3.84        Resistance Training   Training Prescription Yes Yes      Weight 2 lb 2 lb      Reps 10-15 10-15        Interval Training   Interval Training No No        Treadmill   MPH 2 2      Grade 2 2      Minutes 15 15      METs 3.08 3.08        NuStep   Level 6 6      Minutes 15 15      METs 4.1 3.7        REL-XR   Level 5 6      Minutes 15 15      METs 2.9 6.1        Home Exercise Plan   Plans to continue exercise at Home (comment)  walking, staff videos Home (comment)  walking, staff videos      Frequency Add 2 additional days to program exercise sessions. Add 2 additional days to program exercise sessions.      Initial Home Exercises Provided 08/16/22 08/16/22        Oxygen   Maintain Oxygen Saturation 88% or higher 88% or higher               Exercise Comments:   Exercise Comments     Row Name 07/28/22 1035           Exercise Comments First full day of exercise!  Patient was oriented to gym and equipment including functions, settings, policies, and procedures.  Patient's individual exercise prescription and treatment plan were reviewed.  All starting workloads were established based on the results of the 6 minute walk test done at initial orientation visit.  The plan for exercise progression was also introduced and progression will be customized based on patient's performance and goals.                Exercise Goals and Review:   Exercise Goals     Row Name 07/24/22 1647             Exercise Goals   Increase Physical Activity Yes       Intervention Provide advice, education, support and counseling about physical  activity/exercise needs.;Develop an individualized exercise prescription for aerobic and resistive training based on initial evaluation findings, risk stratification, comorbidities and participant's personal goals.       Expected Outcomes Short Term: Attend rehab on a regular basis to increase amount of physical activity.;Long Term: Add in home exercise to make exercise part of routine and to increase amount of physical activity.;Long Term: Exercising regularly at least 3-5 days a week.       Increase Strength and Stamina Yes       Intervention Provide advice, education, support and counseling about physical activity/exercise needs.;Develop an individualized exercise prescription for aerobic and resistive training based on initial evaluation findings, risk stratification, comorbidities and participant's personal goals.       Expected Outcomes Short Term: Increase workloads from initial exercise prescription for resistance, speed, and METs.;Short Term: Perform resistance training exercises routinely during rehab and add in resistance  training at home;Long Term: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 frequency, complications, importance of spacers, rinsing mouth after steroid MDI's, and proper cleaning methods for nebulizers. Review of basic lung anatomy and physiology related to function, structure, and complications of lung disease. Review of risk factors. Discussion about methods for diagnosing sleep apnea and types of masks and machines for OSA. Includes a review of the use of types of environmental controls: home humidity, furnaces, filters, dust mite/pet prevention, HEPA vacuums. Discussion about weather changes, air quality and the benefits of nasal washing. Instruction on Warning signs, infection symptoms, calling MD promptly, preventive modes, and value of vaccinations. Review of effective airway clearance, coughing and/or vibration techniques. Emphasizing that all should Create an Action Plan. Written material given at graduation. Flowsheet Row Pulmonary Rehab from 09/20/2022 in [facility] Cardiac and Pulmonary Rehab  Education need identified 07/24/22  Date 09/13/22  Educator [facility]  Instruction Review Code 1- Verbalizes Understanding       AED/CPR: - Group verbal and written instruction with the use of models to demonstrate the basic use of the AED with the basic ABC's of resuscitation.    Anatomy and Cardiac Procedures: - Group verbal and visual presentation and models provide information about basic cardiac anatomy and function. Reviews the testing methods done to diagnose heart disease and the outcomes of the test results. Describes the treatment choices: Medical Management, Angioplasty, or Coronary Bypass Surgery for treating various heart conditions including Myocardial Infarction, Angina, Valve Disease, and Cardiac Arrhythmias.  Written material given at graduation.   Medication Safety: - Group verbal and visual instruction to review commonly prescribed medications for heart and lung disease. Reviews the medication, class of the drug, and side effects. Includes the steps to  properly store meds and maintain the prescription regimen.  Written material given at graduation.   Other: -Provides group and verbal instruction on various topics (see comments)   Knowledge Questionnaire Score:  Knowledge Questionnaire Score - 07/24/22 1648       Knowledge Questionnaire Score   Pre Score 14/18              Core Components/Risk Factors/Patient Goals at Admission:  Personal Goals and Risk Factors at Admission - 07/24/22 1650       Core Components/Risk Factors/Patient Goals on Admission    Weight Management Yes;Weight Loss    Intervention Weight Management: Develop a combined nutrition and exercise program designed to reach desired caloric intake, while maintaining appropriate intake of nutrient and fiber, sodium and fats, and appropriate energy expenditure required for the weight goal.;Weight Management: Provide education and appropriate resources to help participant work on and attain dietary goals.;Obesity: Provide education and appropriate resources to help participant work on and attain dietary goals.;Weight Management/Obesity: Establish reasonable short term and long term weight goals.    Admit Weight 220 lb 14.4 oz (100.2 kg)    Goal Weight: Short Term 210 lb (95.3 kg)    Goal Weight: Long Term 200 lb (90.7 kg)  Expected Outcomes Short Term: Continue to assess and modify interventions until short term weight is achieved;Long Term: Adherence to nutrition and physical activity/exercise program aimed toward attainment of established weight goal;Weight Loss: Understanding of general recommendations for a balanced deficit meal plan, which promotes 1-2 lb weight loss per week and includes a negative energy balance of [id number] kcal/d;Understanding recommendations for meals to include 15-35% energy as protein, 25-35% energy from fat, 35-60% energy from carbohydrates, less than 200mg  of dietary cholesterol, 20-35 gm of total fiber daily;Understanding of distribution of  calorie intake throughout the day with the consumption of 4-5 meals/snacks    Improve shortness of breath with ADL's Yes    Intervention Provide education, individualized exercise plan and daily activity instruction to help decrease symptoms of SOB with activities of daily living.    Expected Outcomes Short Term: Improve cardiorespiratory fitness to achieve a reduction of symptoms when performing ADLs;Long Term: Be able to perform more ADLs without symptoms or delay the onset of symptoms    Diabetes Yes    Intervention Provide education about signs/symptoms and action to take for hypo/hyperglycemia.;Provide education about proper nutrition, including hydration, and aerobic/resistive exercise prescription along with prescribed medications to achieve blood glucose in normal ranges: Fasting glucose 65-99 mg/dL    Expected Outcomes Short Term: Participant verbalizes understanding of the signs/symptoms and immediate care of hyper/hypoglycemia, proper foot care and importance of medication, aerobic/resistive exercise and nutrition plan for blood glucose control.;Long Term: Attainment of HbA1C < 7%.    Hypertension Yes    Intervention Provide education on lifestyle modifcations including regular physical activity/exercise, weight management, moderate sodium restriction and increased consumption of fresh fruit, vegetables, and low fat dairy, alcohol moderation, and smoking cessation.;Monitor prescription use compliance.    Expected Outcomes Short Term: Continued assessment and intervention until BP is < 140/37mm HG in hypertensive participants. < 130/87mm HG in hypertensive participants with diabetes, heart failure or chronic kidney disease.;Long Term: Maintenance of blood pressure at goal levels.    Lipids Yes    Intervention Provide education and support for participant on nutrition & aerobic/resistive exercise along with prescribed medications to achieve LDL 70mg , HDL >40mg .    Expected Outcomes Short Term:  Participant states understanding of desired cholesterol values and is compliant with medications prescribed. Participant is following exercise prescription and nutrition guidelines.;Long Term: Cholesterol controlled with medications as prescribed, with individualized exercise RX and with personalized nutrition plan. Value goals: LDL < 70mg , HDL > 40 mg.             Education:Diabetes - Individual verbal and written instruction to review signs/symptoms of diabetes, desired ranges of glucose level fasting, after meals and with exercise. Acknowledge that pre and post exercise glucose checks will be done for 3 sessions at entry of program. Flowsheet Row Pulmonary Rehab from 09/20/2022 in [facility] Cardiac and Pulmonary Rehab  Date 07/24/22  Educator [facility]  Instruction Review Code 1- Verbalizes Understanding       Know Your Numbers and Heart Failure: - Group verbal and visual instruction to discuss disease risk factors for cardiac and pulmonary disease and treatment options.  Reviews associated critical values for Overweight/Obesity, Hypertension, Cholesterol, and Diabetes.  Discusses basics of heart failure: signs/symptoms and treatments.  Introduces Heart Failure Zone chart for action plan for heart failure.  Written material given at graduation.   Core Components/Risk Factors/Patient Goals Review:   Goals and Risk Factor Review     Row Name 08/16/22 1142 09/13/22 1122  Core Components/Risk Factors/Patient Goals Review   Personal Goals Review Weight Management/Obesity;Improve shortness of breath with ADL's;Diabetes;Increase knowledge of respiratory medications and ability to use respiratory devices properly.;Hypertension Weight Management/Obesity;Improve shortness of breath with ADL's;Diabetes;Increase knowledge of respiratory medications and ability to use respiratory devices properly.;Hypertension      Review Chazity is doing well in rehab.  Her weight has been yoyoing. She will go down  4 then up 5 then down 2 and up 3.  She is trying to find a balance and maintain the downward trend.  Her breathing id doing well and she is using her inhaler like she is supposed to.  Her sugars have been running higher than normal.  She really wants to work to get better control over her sugars and will set up an appointment with dietitan to help.  Her pressures are doing well and she does check them at home. Shainah is still working on losing some weight. She weighed in to day at 228.3 lb but would like to get down to 200 lb. She also reports that she is checking her blood sugars at home and states that they are progressing and she is gaining control over them. She also reports that her breathing has improved and she has been practicing PLB technique. She has not been checking her BP at home but states that she does own a BP cuff but has not been using it due to her readings being good while in rehab.      Expected Outcomes short: Continue to work on better blood sugar control to help with weight loss Long; continue to monitor risk factors. Short: Continue to work on improving blood sugars. Long: Continue to manage lifestyle risk factors.               Core Components/Risk Factors/Patient Goals at Discharge (Final Review):   Goals and Risk Factor Review - 09/13/22 1122       Core Components/Risk Factors/Patient Goals Review   Personal Goals Review Weight Management/Obesity;Improve shortness of breath with ADL's;Diabetes;Increase knowledge of respiratory medications and ability to use respiratory devices properly.;Hypertension    Review Ruthie is still working on losing some weight. She weighed in to day at 228.3 lb but would like to get down to 200 lb. She also reports that she is checking her blood sugars at home and states that they are progressing and she is gaining control over them. She also reports that her breathing has improved and she has been practicing PLB technique. She has not been  checking her BP at home but states that she does own a BP cuff but has not been using it due to her readings being good while in rehab.    Expected Outcomes Short: Continue to work on improving blood sugars. Long: Continue to manage lifestyle risk factors.             ITP Comments:  ITP Comments     Row Name 07/24/22 1028 07/24/22 1640 07/28/22 1035 08/02/22 0854 08/17/22 0921   ITP Comments Virtual Visit completed. Patient informed on EP and RD appointment and 6 Minute walk test. Patient also informed of patient health questionnaires on My Chart. Patient Verbalizes understanding. Visit diagnosis can be found in [facility] 07/12/22. Completed 6MWT and gym orientation. Initial ITP created and sent for review to Dr. Zetta Bills, Medical Director. First full day of exercise!  Patient was oriented to gym and equipment including functions, settings, policies, and procedures.  Patient's individual exercise prescription and treatment  plan were reviewed.  All starting workloads were established based on the results of the 6 minute walk test done at initial orientation visit.  The plan for exercise progression was also introduced and progression will be customized based on patient's performance and goals. 30 Day review completed. Medical Director ITP review done, changes made as directed, and signed approval by Medical Director.   new to program Completed initial RD consultation    [place] Name 08/30/22 0749 09/27/22 0746         ITP Comments 30 day review completed. ITP sent to Dr. Zetta Bills, Medical Director of  Pulmonary Rehab. Continue with ITP unless changes are made by physician. 30 Day review completed. Medical Director ITP review done, changes made as directed, and signed approval by Medical Director.               Comments:

## 2022-09-27 NOTE — Progress Notes (Signed)
Daily Session Note  Patient Details  Name: Kaitlin Kelley MRN: [id number] Date of Birth: Aug 13, 1950 Referring Provider:   Flowsheet Row Pulmonary Rehab from 07/24/2022 in [facility] Cardiac and Pulmonary Rehab  Referring Provider Lanney Gins       Encounter Date: 09/27/2022  Check In:  Session Check In - 09/27/22 1105       Check-In   Supervising physician immediately available to respond to emergencies See telemetry face sheet for immediately available ER MD    Location [facility]-Cardiac & Pulmonary Rehab    Staff Present Darlyne Russian, RN, ADN;Meredith Sherryll Burger, RN BSN;Joseph Hood, RCP,RRT,BSRT;Noah Tickle, BS, Exercise Physiologist    Virtual Visit No    Medication changes reported     No    Fall or balance concerns reported    No    Warm-up and Cool-down Performed on first and last piece of equipment    Resistance Training Performed Yes    VAD Patient? No    PAD/SET Patient? No      Pain Assessment   Currently in Pain? No/denies                Social History   Tobacco Use  Smoking Status Never  Smokeless Tobacco Never    Goals Met:  Independence with exercise equipment Exercise tolerated well No report of concerns or symptoms today Strength training completed today  Goals Unmet:  Not Applicable  Comments: Pt able to follow exercise prescription today without complaint.  Will continue to monitor for progression.    Dr. Emily Filbert is Medical Director for [place].  Dr. Ottie Glazier is Medical Director for [facility] Pulmonary Rehabilitation.

## 2022-09-29 ENCOUNTER — Encounter: Payer: Medicare Other | Admitting: *Deleted

## 2022-09-29 DIAGNOSIS — D860 Sarcoidosis of lung: Secondary | ICD-10-CM | POA: Diagnosis not present

## 2022-09-29 NOTE — Progress Notes (Signed)
Daily Session Note  Patient Details  Name: Kaitlin Kelley MRN: [id number] Date of Birth: 06-22-1951 Referring Provider:   Flowsheet Row Pulmonary Rehab from 07/24/2022 in [facility] Cardiac and Pulmonary Rehab  Referring Provider Lanney Gins       Encounter Date: 09/29/2022  Check In:  Session Check In - 09/29/22 1004       Check-In   Supervising physician immediately available to respond to emergencies See telemetry face sheet for immediately available ER MD    Location [facility]-Cardiac & Pulmonary Rehab    Staff Present Heath Lark, RN, BSN, CCRP;Jessica [place], MA, RCEP, CCRP, CCET;Joseph [place], [place]    Virtual Visit No    Medication changes reported     No    Fall or balance concerns reported    No    Warm-up and Cool-down Performed on first and last piece of equipment    Resistance Training Performed Yes    VAD Patient? No    PAD/SET Patient? No      Pain Assessment   Currently in Pain? No/denies                Social History   Tobacco Use  Smoking Status Never  Smokeless Tobacco Never    Goals Met:  Proper associated with RPD/PD & O2 Sat Independence with exercise equipment Exercise tolerated well No report of concerns or symptoms today  Goals Unmet:  Not Applicable  Comments: Pt able to follow exercise prescription today without complaint.  Will continue to monitor for progression.    Dr. Emily Filbert is Medical Director for [place].  Dr. Ottie Glazier is Medical Director for [facility] Pulmonary Rehabilitation.

## 2022-10-02 ENCOUNTER — Encounter: Payer: Medicare Other | Admitting: *Deleted

## 2022-10-02 DIAGNOSIS — D860 Sarcoidosis of lung: Secondary | ICD-10-CM | POA: Diagnosis not present

## 2022-10-02 NOTE — Progress Notes (Signed)
Daily Session Note  Patient Details  Name: Kaitlin Kelley MRN: [id number] Date of Birth: 10-23-1950 Referring Provider:   Flowsheet Row Pulmonary Rehab from 07/24/2022 in [facility] Cardiac and Pulmonary Rehab  Referring Provider Lanney Gins       Encounter Date: 10/02/2022  Check In:  Session Check In - 10/02/22 1119       Check-In   Supervising physician immediately available to respond to emergencies See telemetry face sheet for immediately available ER MD    Location [facility]-Cardiac & Pulmonary Rehab    Staff Present Darlyne Russian, RN, Doyce Para, BS, ACSM CEP, Exercise Physiologist;Susanne Bice, RN, BSN, CCRP;Noah Tickle, BS, Exercise Physiologist    Virtual Visit No    Medication changes reported     No    Fall or balance concerns reported    No    Warm-up and Cool-down Performed on first and last piece of equipment    Resistance Training Performed Yes    VAD Patient? No    PAD/SET Patient? No      Pain Assessment   Currently in Pain? No/denies                Social History   Tobacco Use  Smoking Status Never  Smokeless Tobacco Never    Goals Met:  Independence with exercise equipment Exercise tolerated well No report of concerns or symptoms today Strength training completed today  Goals Unmet:  Not Applicable  Comments: Pt able to follow exercise prescription today without complaint.  Will continue to monitor for progression.    Dr. Emily Filbert is Medical Director for [place].  Dr. Ottie Glazier is Medical Director for [facility] Pulmonary Rehabilitation.

## 2022-10-04 ENCOUNTER — Encounter: Payer: Medicare Other | Admitting: *Deleted

## 2022-10-04 DIAGNOSIS — D860 Sarcoidosis of lung: Secondary | ICD-10-CM

## 2022-10-04 NOTE — Progress Notes (Signed)
Daily Session Note  Patient Details  Name: Kaitlin Kelley MRN: [id number] Date of Birth: 11-Jan-1951 Referring Provider:   Flowsheet Row Pulmonary Rehab from 07/24/2022 in [facility] Cardiac and Pulmonary Rehab  Referring Provider Lanney Gins       Encounter Date: 10/04/2022  Check In:  Session Check In - 10/04/22 1117       Check-In   Supervising physician immediately available to respond to emergencies See telemetry face sheet for immediately available ER MD    Location [facility]-Cardiac & Pulmonary Rehab    Staff Present Antionette Fairy, BS, Exercise Physiologist;Joseph [place], RCP,RRT,BSRT;Pranshu Lyster Tasley RN, Odelia Gage, RN, [place]    Virtual Visit No    Medication changes reported     No    Fall or balance concerns reported    No    Tobacco Cessation No Change    Warm-up and Cool-down Performed on first and last piece of equipment    Resistance Training Performed Yes    VAD Patient? No    PAD/SET Patient? No      Pain Assessment   Currently in Pain? No/denies                Social History   Tobacco Use  Smoking Status Never  Smokeless Tobacco Never    Goals Met:  Independence with exercise equipment Exercise tolerated well No report of concerns or symptoms today Strength training completed today  Goals Unmet:  Not Applicable  Comments: Pt able to follow exercise prescription today without complaint.  Will continue to monitor for progression.    Dr. Emily Filbert is Medical Director for [place].  Dr. Ottie Glazier is Medical Director for [facility] Pulmonary Rehabilitation.

## 2022-10-06 ENCOUNTER — Encounter: Payer: Medicare Other | Admitting: *Deleted

## 2022-10-06 DIAGNOSIS — D860 Sarcoidosis of lung: Secondary | ICD-10-CM

## 2022-10-06 NOTE — Progress Notes (Signed)
Daily Session Note  Patient Details  Name: Kaitlin Kelley MRN: [id number] Date of Birth: Jul 05, 1951 Referring Provider:   Flowsheet Row Pulmonary Rehab from 07/24/2022 in [facility] Cardiac and Pulmonary Rehab  Referring Provider Lanney Gins       Encounter Date: 10/06/2022  Check In:  Session Check In - 10/06/22 1147       Check-In   Supervising physician immediately available to respond to emergencies See telemetry face sheet for immediately available ER MD    Location [facility]-Cardiac & Pulmonary Rehab    Staff Present Heath Lark, RN, BSN, CCRP;Krista Frederico Hamman RN, BSN;Joseph [place], [place]    Virtual Visit No    Medication changes reported     No    Fall or balance concerns reported    No    Warm-up and Cool-down Performed on first and last piece of equipment    Resistance Training Performed Yes    VAD Patient? No    PAD/SET Patient? No      Pain Assessment   Currently in Pain? No/denies                Social History   Tobacco Use  Smoking Status Never  Smokeless Tobacco Never    Goals Met:  Proper associated with RPD/PD & O2 Sat Independence with exercise equipment Exercise tolerated well No report of concerns or symptoms today  Goals Unmet:  Not Applicable  Comments: Pt able to follow exercise prescription today without complaint.  Will continue to monitor for progression.    Dr. Emily Filbert is Medical Director for [place].  Dr. Ottie Glazier is Medical Director for [facility] Pulmonary Rehabilitation.

## 2022-10-09 ENCOUNTER — Encounter: Payer: Medicare Other | Attending: Pulmonary Disease | Admitting: *Deleted

## 2022-10-09 DIAGNOSIS — D860 Sarcoidosis of lung: Secondary | ICD-10-CM | POA: Insufficient documentation

## 2022-10-09 NOTE — Progress Notes (Signed)
Daily Session Note  Patient Details  Name: Kaitlin Kelley MRN: [id number] Date of Birth: 05-Oct-1950 Referring Provider:   Flowsheet Row Pulmonary Rehab from 07/24/2022 in [facility] Cardiac and Pulmonary Rehab  Referring Provider Lanney Gins       Encounter Date: 10/09/2022  Check In:  Session Check In - 10/09/22 1113       Check-In   Supervising physician immediately available to respond to emergencies See telemetry face sheet for immediately available ER MD    Location [facility]-Cardiac & Pulmonary Rehab    Staff Present Darlyne Russian, RN, Doyce Para, BS, ACSM CEP, Exercise Physiologist;Meredith Sherryll Burger, RN BSN;Joseph Tessie Fass, RCP,RRT,BSRT    Virtual Visit No    Medication changes reported     No    Fall or balance concerns reported    No    Warm-up and Cool-down Performed on first and last piece of equipment    Resistance Training Performed Yes    VAD Patient? No    PAD/SET Patient? No      Pain Assessment   Currently in Pain? No/denies                Social History   Tobacco Use  Smoking Status Never  Smokeless Tobacco Never    Goals Met:  Independence with exercise equipment Exercise tolerated well No report of concerns or symptoms today Strength training completed today  Goals Unmet:  Not Applicable  Comments: Pt able to follow exercise prescription today without complaint.  Will continue to monitor for progression.    Dr. Emily Filbert is Medical Director for [place].  Dr. Ottie Glazier is Medical Director for [facility] Pulmonary Rehabilitation.

## 2022-10-13 DIAGNOSIS — D860 Sarcoidosis of lung: Secondary | ICD-10-CM | POA: Diagnosis not present

## 2022-10-13 NOTE — Progress Notes (Signed)
Daily Session Note  Patient Details  Name: Kaitlin Kelley MRN: [id number] Date of Birth: 01-18-1951 Referring Provider:   Flowsheet Row Pulmonary Rehab from 07/24/2022 in [facility] Cardiac and Pulmonary Rehab  Referring Provider Karna Christmas       Encounter Date: 10/13/2022  Check In:  Session Check In - 10/13/22 1107       Check-In   Supervising physician immediately available to respond to emergencies See telemetry face sheet for immediately available ER MD    Location [facility]-Cardiac & Pulmonary Rehab    Staff Present Darcel Bayley, RN,BC,MSN;Joseph [place], RCP,RRT,BSRT;Noah [place], [place], Exercise Physiologist    Virtual Visit No    Medication changes reported     No    Fall or balance concerns reported    No    Tobacco Cessation No Change    Warm-up and Cool-down Performed on first and last piece of equipment    Resistance Training Performed Yes    VAD Patient? No    PAD/SET Patient? No      Pain Assessment   Currently in Pain? No/denies    Multiple Pain Sites No                Social History   Tobacco Use  Smoking Status Never  Smokeless Tobacco Never    Goals Met:  Independence with exercise equipment Exercise tolerated well No report of concerns or symptoms today  Goals Unmet:  Not Applicable  Comments: Pt able to follow exercise prescription today without complaint.  Will continue to monitor for progression.    Dr. Bethann Punches is Medical Director for [facility] Cardiac Rehabilitation.  Dr. Vida Rigger is Medical Director for [facility] Pulmonary Rehabilitation.

## 2022-10-18 ENCOUNTER — Encounter: Payer: Medicare Other | Admitting: *Deleted

## 2022-10-18 VITALS — Ht 66.0 in | Wt 224.8 lb

## 2022-10-18 DIAGNOSIS — D860 Sarcoidosis of lung: Secondary | ICD-10-CM | POA: Diagnosis not present

## 2022-10-18 NOTE — Patient Instructions (Addendum)
Discharge Patient Instructions  Patient Details  Name: Kaitlin Kelley MRN: [id number] Date of Birth: 05-09-1951 Referring Provider:  Elissa HeftyGalbreath, John W, MD   Number of Visits: 36  Reason for Discharge:  Patient reached a stable level of exercise. Patient independent in their exercise. Patient has met program and personal goals.  Diagnosis:  Sarcoidosis of lung  Initial Exercise Prescription:  Initial Exercise Prescription - 07/24/22 1600       Date of Initial Exercise RX and Referring Provider   Date 07/24/22    Referring Provider Aleskerov      Oxygen   Maintain Oxygen Saturation 88% or higher      Treadmill   MPH 1.5    Grade 1    Minutes 15    METs 2.35      NuStep   Level 2    SPM 80    Minutes 15    METs 2.15      REL-XR   Level 2    Speed 50    Minutes 15    METs 2.15      Biostep-RELP   Level 2    SPM 50    Minutes 15    METs 2.15      Intensity   THRR 40-80% of Max Heartrate 102-132    Ratings of Perceived Exertion 11-13    Perceived Dyspnea 0-4      Progression   Progression Continue to progress workloads to maintain intensity without signs/symptoms of physical distress.      Resistance Training   Training Prescription Yes    Weight 2    Reps 10-15             Discharge Exercise Prescription (Final Exercise Prescription Changes):  Exercise Prescription Changes - 10/09/22 1300       Response to Exercise   Blood Pressure (Admit) 118/68    Blood Pressure (Exit) 128/70    Heart Rate (Admit) 75 bpm    Heart Rate (Exercise) 113 bpm    Heart Rate (Exit) 91 bpm    Oxygen Saturation (Admit) 95 %    Oxygen Saturation (Exercise) 93 %    Oxygen Saturation (Exit) 96 %    Rating of Perceived Exertion (Exercise) 13    Perceived Dyspnea (Exercise) 2    Symptoms none    Duration Continue with 30 min of aerobic exercise without signs/symptoms of physical distress.    Intensity THRR unchanged      Progression   Progression Continue to  progress workloads to maintain intensity without signs/symptoms of physical distress.    Average METs 3.68      Resistance Training   Training Prescription Yes    Weight 2 lb    Reps 10-15      Interval Training   Interval Training No      Treadmill   MPH 2    Grade 2    Minutes 11    METs 3.08      NuStep   Level 6    Minutes 15    METs 3.8      Elliptical   Level 1    Speed 1    Minutes 3      REL-XR   Level 7    Minutes 15    METs 3      Home Exercise Plan   Plans to continue exercise at Home (comment)   walking, staff videos   Frequency Add 2 additional days to program exercise sessions.  Initial Home Exercises Provided 08/16/22      Oxygen   Maintain Oxygen Saturation 88% or higher             Functional Capacity:  6 Minute Walk     Row Name 07/24/22 1640 10/18/22 1120       6 Minute Walk   Phase Initial Initial    Distance 1215 feet 1345 feet    Distance % Change -- 10.7 %    Distance Feet Change -- 130 ft    Walk Time 6 minutes 6 minutes    # of Rest Breaks 0 0    MPH 2.3 2.55    METS 2.15 2.89    RPE 13 13    Perceived Dyspnea  1 1    VO2 Peak 7.54 10.11    Symptoms No Yes (comment)    Comments -- left hip pain 6/10    Resting HR 73 bpm 97 bpm    Resting BP 132/82 124/70    Resting Oxygen Saturation  96 % 97 %    Exercise Oxygen Saturation  during 6 min walk 93 % 96 %    Max Ex. HR 98 bpm 142 bpm    Max Ex. BP 140/80 148/76    2 Minute Post BP 128/70 132/66      Interval HR   1 Minute HR 81 113    2 Minute HR 95 124    3 Minute HR 98 130    4 Minute HR 97 133    5 Minute HR 97 140    6 Minute HR 95 142    2 Minute Post HR 69 96    Interval Heart Rate? Yes Yes      Interval Oxygen   Interval Oxygen? Yes Yes    Baseline Oxygen Saturation % 96 % 97 %    1 Minute Oxygen Saturation % 95 % 97 %    1 Minute Liters of Oxygen 0 L 0 L    2 Minute Oxygen Saturation % 93 % 97 %    2 Minute Liters of Oxygen 0 L 0 L    3 Minute  Oxygen Saturation % 95 % 97 %    3 Minute Liters of Oxygen 0 L 0 L    4 Minute Oxygen Saturation % 96 % 98 %    4 Minute Liters of Oxygen 0 L 0 L    5 Minute Oxygen Saturation % 95 % 97 %    5 Minute Liters of Oxygen 0 L 0 L    6 Minute Oxygen Saturation % 96 % 97 %    6 Minute Liters of Oxygen 0 L 0 L    2 Minute Post Oxygen Saturation % 95 % 96 %    2 Minute Post Liters of Oxygen 0 L 0 L            Nutrition & Weight - Outcomes:  Pre Biometrics - 07/24/22 1647       Pre Biometrics   Height 5\' 6"  (1.676 m)    Weight 220 lb 14.4 oz (100.2 kg)    Waist Circumference 42 inches    Hip Circumference 49 inches    Waist to Hip Ratio 0.86 %    BMI (Calculated) 35.67    Single Leg Stand 1.75 seconds             Post Biometrics - 10/18/22 1124        Post  Biometrics  Height 5\' 6"  (1.676 m)    Weight 224 lb 12.8 oz (102 kg)    Waist Circumference 42.5 inches    Hip Circumference 46.5 inches    Waist to Hip Ratio 0.91 %    BMI (Calculated) 36.3    Single Leg Stand 5.7 seconds             Nutrition:  Nutrition Therapy & Goals - 08/17/22 0830       Nutrition Therapy   Diet Heart healthy, low Na, T2DM MNT    Drug/Food Interactions Statins/Certain Fruits    Protein (specify units) 80-90g    Fiber 25 grams    Whole Grain Foods 3 servings    Saturated Fats 14 max. grams    Fruits and Vegetables 8 servings/day    Sodium 2 grams      Personal Nutrition Goals   Nutrition Goal ST: include 1-2 more meals/snacks, drink in-between meals, pair foods with fiber, fat, and protein LT: maintain A1C <7, meet protein needs, eat 5-6 meals/snacks per day    Comments 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 PMHx includes arthritis, CKD stg 3, T2DM, DVT, GERD, HLD, HTN, hypothyroidism, Neuro-degenerative disorders. 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 Most recent labs reviewed 08/17/22: GFR 48 (08/15/22). A1C 7.8 (05/17/22). Relevant medications reviewed  08/17/22: Zinc/Vitamin C/Collagen gummies, dicyclomine, duloxetine, folic acid, glipizide, insulin (levemir, humalog), levothyroxine, methotrexate, MVI with minerals, rosuvastatin, trazodone. Lezette reports that she does not have a normal routine for eating; she reports that in the last couple of years she has had a lower appettie and has a decreased taste for many foods aside from sweets. She also also has felt that it has been hard to do go out; she lives with her son at this time and does cook for herself and her son; they will also go out to eat for fast food or restaurants. She reports being weight stable ~220 lbs. [place]ne reports that she has had to go to a regular MVI with minerals as well as iron, B-12, folic acid (she was just denied for a refill), Zinc/Vitamin C/Collagen gummies. She feels that her A1C is higher now as she has a continuous monitor - she feels this is due to the sweet food. She reports not eating many processed foods, but will get deli ham. Additionally, she reports not frying food and tries to have food that is healthier when eating out. She remembers that she is supposed to eat small and frequent meals, focusing on protein first (aiming for at least 80g/day), and not drinking with meals, however, she finds these difficult. Discussed general healthy eating, Gastric Bypass MNT, and T2DM MNT. Lyana reports that she does not like leftovers or protein shakes - discussed easy to prep meals as well as ingredient prepping to help with variety. Encouraged small frequent meals including fiber, fat, and protein.      Intervention Plan   Intervention Prescribe, educate and counsel regarding individualized specific dietary modifications aiming towards targeted core components such as weight, hypertension, lipid management, diabetes, heart failure and other comorbidities.    Expected Outcomes Short Term Goal: Understand basic principles of dietary content, such as calories, fat, sodium, cholesterol and  nutrients.;Short Term Goal: A plan has been developed with personal nutrition goals set during dietitian appointment.;Long Term Goal: Adherence to prescribed nutrition plan.

## 2022-10-18 NOTE — Progress Notes (Signed)
Daily Session Note  Patient Details  Name: Kaitlin Kelley MRN: [id number] Date of Birth: [birth year] Referring Provider:   Flowsheet Row Pulmonary Rehab from 07/24/2022 in [facility] Cardiac and Pulmonary Rehab  Referring Provider Karna Christmas       Encounter Date: 10/18/2022  Check In:  Session Check In - 10/18/22 1117       Check-In   Supervising physician immediately available to respond to emergencies See telemetry face sheet for immediately available ER MD    Location [facility]-Cardiac & Pulmonary Rehab    Staff Present Lanny Hurst, RN, ADN;Joseph Reino Kent, RCP,RRT,BSRT;Noah Tickle, BS, Exercise Physiologist    Virtual Visit No    Medication changes reported     No    Fall or balance concerns reported    No    Warm-up and Cool-down Performed on first and last piece of equipment    Resistance Training Performed Yes    VAD Patient? No    PAD/SET Patient? No      Pain Assessment   Currently in Pain? No/denies                Social History   Tobacco Use  Smoking Status Never  Smokeless Tobacco Never    Goals Met:  Independence with exercise equipment Exercise tolerated well No report of concerns or symptoms today Strength training completed today  Goals Unmet:  Not Applicable  Comments: Pt able to follow exercise prescription today without complaint.  Will continue to monitor for progression.    Dr. Bethann Punches is Medical Director for [facility] Cardiac Rehabilitation.  Dr. Vida Rigger is Medical Director for [facility] Pulmonary Rehabilitation.

## 2022-10-20 ENCOUNTER — Encounter: Payer: Medicare Other | Admitting: *Deleted

## 2022-10-20 DIAGNOSIS — D860 Sarcoidosis of lung: Secondary | ICD-10-CM | POA: Diagnosis not present

## 2022-10-20 LAB — GLUCOSE, CAPILLARY: Glucose-Capillary: 108 mg/dL — ABNORMAL HIGH (ref 70–99)

## 2022-10-20 NOTE — Progress Notes (Signed)
Daily Session Note  Patient Details  Name: Kaitlin Kelley MRN: [id number] Date of Birth: 04/19/1951 Referring Provider:   Flowsheet Row Pulmonary Rehab from 07/24/2022 in [facility] Cardiac and Pulmonary Rehab  Referring Provider Karna Christmas       Encounter Date: 10/20/2022  Check In:  Session Check In - 10/20/22 1117       Check-In   Supervising physician immediately available to respond to emergencies See telemetry face sheet for immediately available ER MD    Location [facility]-Cardiac & Pulmonary Rehab    Staff Present Elige Ko, RCP,RRT,BSRT;Harlene Ramus, RN, BSN;Tilman Mcclaren, MA, RCEP, CCRP, CCET    Virtual Visit No    Medication changes reported     No    Fall or balance concerns reported    No    Tobacco Cessation No Change    Warm-up and Cool-down Performed on first and last piece of equipment    Resistance Training Performed Yes    VAD Patient? No    PAD/SET Patient? No      Pain Assessment   Currently in Pain? No/denies                Social History   Tobacco Use  Smoking Status Never  Smokeless Tobacco Never    Goals Met:  Proper associated with RPD/PD & O2 Sat Independence with exercise equipment Using PLB without cueing & demonstrates good technique Exercise tolerated well No report of concerns or symptoms today Strength training completed today  Goals Unmet:  Not Applicable  Comments: Pt able to follow exercise prescription today without complaint.  Will continue to monitor for progression.   Dr. Bethann Punches is Medical Director for [facility] Cardiac Rehabilitation.  Dr. Vida Rigger is Medical Director for [facility] Pulmonary Rehabilitation.

## 2022-10-23 ENCOUNTER — Encounter: Payer: Medicare Other | Admitting: *Deleted

## 2022-10-23 DIAGNOSIS — D860 Sarcoidosis of lung: Secondary | ICD-10-CM | POA: Diagnosis not present

## 2022-10-23 NOTE — Progress Notes (Signed)
Daily Session Note  Patient Details  Name: Kaitlin Kelley MRN: [id number] Date of Birth: 10-Aug-1950 Referring Provider:   Flowsheet Row Pulmonary Rehab from 07/24/2022 in [facility] Cardiac and Pulmonary Rehab  Referring Provider Karna Christmas       Encounter Date: 10/23/2022  Check In:  Session Check In - 10/23/22 1113       Check-In   Supervising physician immediately available to respond to emergencies See telemetry face sheet for immediately available ER MD    Location [facility]-Cardiac & Pulmonary Rehab    Staff Present Lanny Hurst, RN, Franki Monte, BS, ACSM CEP, Exercise Physiologist;Meredith Jewel Baize, RN BSN;Noah Tickle, BS, Exercise Physiologist    Virtual Visit No    Medication changes reported     No    Fall or balance concerns reported    No    Warm-up and Cool-down Performed on first and last piece of equipment    Resistance Training Performed Yes    VAD Patient? No    PAD/SET Patient? No      Pain Assessment   Currently in Pain? No/denies                Social History   Tobacco Use  Smoking Status Never  Smokeless Tobacco Never    Goals Met:  Independence with exercise equipment Exercise tolerated well No report of concerns or symptoms today Strength training completed today  Goals Unmet:  Not Applicable  Comments: Pt able to follow exercise prescription today without complaint.  Will continue to monitor for progression.    Dr. Bethann Punches is Medical Director for [facility] Cardiac Rehabilitation.  Dr. Vida Rigger is Medical Director for [facility] Pulmonary Rehabilitation.

## 2022-10-25 ENCOUNTER — Encounter: Payer: Self-pay | Admitting: *Deleted

## 2022-10-25 ENCOUNTER — Encounter: Payer: Medicare Other | Admitting: *Deleted

## 2022-10-25 DIAGNOSIS — D860 Sarcoidosis of lung: Secondary | ICD-10-CM

## 2022-10-25 NOTE — Progress Notes (Signed)
Daily Session Note  Patient Details  Name: Nelissa Bolduc MRN: [id number] Date of Birth: 05-12-1951 Referring Provider:   Flowsheet Row Pulmonary Rehab from 07/24/2022 in [facility] Cardiac and Pulmonary Rehab  Referring Provider Karna Christmas       Encounter Date: 10/25/2022  Check In:  Session Check In - 10/25/22 1112       Check-In   Supervising physician immediately available to respond to emergencies See telemetry face sheet for immediately available ER MD    Location [facility]-Cardiac & Pulmonary Rehab    Staff Present Lanny Hurst, RN, ADN;Meredith Jewel Baize, RN BSN;Joseph Hood, RCP,RRT,BSRT;Noah Tickle, BS, Exercise Physiologist    Virtual Visit No    Medication changes reported     No    Fall or balance concerns reported    No    Warm-up and Cool-down Performed on first and last piece of equipment    Resistance Training Performed Yes    VAD Patient? No    PAD/SET Patient? No      Pain Assessment   Currently in Pain? No/denies                Social History   Tobacco Use  Smoking Status Never  Smokeless Tobacco Never    Goals Met:  Independence with exercise equipment Exercise tolerated well No report of concerns or symptoms today Strength training completed today  Goals Unmet:  Not Applicable  Comments: Pt able to follow exercise prescription today without complaint.  Will continue to monitor for progression.    Dr. Bethann Punches is Medical Director for [facility] Cardiac Rehabilitation.  Dr. Vida Rigger is Medical Director for [facility] Pulmonary Rehabilitation.

## 2022-10-25 NOTE — Progress Notes (Signed)
Pulmonary Individual Treatment Plan  Patient Details  Name: Kaitlin Kelley MRN: [id number] Date of Birth: 09-Aug-1950 Referring Provider:   Flowsheet Row Pulmonary Rehab from 07/24/2022 in [facility] Cardiac and Pulmonary Rehab  Referring Provider Karna Christmas       Initial Encounter Date:  Flowsheet Row Pulmonary Rehab from 07/24/2022 in [facility] Cardiac and Pulmonary Rehab  Date 07/24/22       Visit Diagnosis: Sarcoidosis of lung  Patient's Home Medications on Admission:  Current Outpatient Medications:    amLODipine (NORVASC) 5 MG tablet, Take 5 mg by mouth at bedtime. , Disp: , Rfl:    azelastine (ASTELIN) 0.1 % nasal spray, Place 1 spray into both nostrils as needed for allergies., Disp: , Rfl:    Bacillus Coagulans-Inulin (ALIGN PREBIOTIC-PROBIOTIC PO), Take 1 capsule by mouth daily., Disp: , Rfl:    calcium-vitamin D (OSCAL WITH D) 500-5 MG-MCG tablet, Take 1 tablet by mouth. , Disp: , Rfl:    Cholecalciferol 50 MCG (2000 UT) CAPS, Take 2,000 Units by mouth daily. , Disp: , Rfl:    Collagen-Vitamin C-Biotin (COLLAGEN 1500/C) 500-50-0.8 MG CAPS, daily., Disp: , Rfl:    cyanocobalamin 100 MCG tablet, Take by mouth., Disp: , Rfl:    Cyanocobalamin 3000 MCG CAPS, Take 3,000 mcg by mouth daily. , Disp: , Rfl:    dicyclomine (BENTYL) 10 MG capsule, Take 10 mg by mouth 4 (four) times daily as needed for spasms. , Disp: , Rfl:    dorzolamide (TRUSOPT) 2 % ophthalmic solution, Place 1 drop into both eyes in the morning and at bedtime. , Disp: , Rfl:    Dulaglutide (TRULICITY) 1.5 MG/0.5ML SOPN, Inject 0.5 mLs into the skin once a week. (Patient not taking: Reported on 02/19/2020), Disp: , Rfl:    DULoxetine (CYMBALTA) 60 MG capsule, Take 60 mg by mouth 2 (two) times daily. , Disp: , Rfl:    ferrous gluconate (FERGON) 240 (27 FE) MG tablet, Take by mouth., Disp: , Rfl:    folic acid (FOLVITE) 1 MG tablet, Take 1 mg by mouth daily., Disp: , Rfl:    gabapentin (NEURONTIN) 300 MG capsule, Take  300 mg by mouth 3 (three) times daily., Disp: , Rfl:    glipiZIDE (GLUCOTROL) 10 MG tablet, Take 10 mg by mouth in the morning and at bedtime. , Disp: , Rfl:    insulin detemir (LEVEMIR) 100 UNIT/ML injection, Inject 30 Units into the skin at bedtime. , Disp: , Rfl:    Insulin Glargine-Lixisenatide (SOLIQUA) 100-33 UNT-MCG/ML SOPN, Inject 30 Units into the skin at bedtime., Disp: , Rfl:    Lactobacillus 0.05-0.05 MG TABS, Take by mouth., Disp: , Rfl:    Lancets 28G MISC, use to check blood sugar three to four times a day or as directed (Patient not taking: Reported on 07/24/2022), Disp: , Rfl:    levothyroxine (SYNTHROID) 75 MCG tablet, Take 1 tablet by mouth daily. (Patient not taking: Reported on 07/24/2022), Disp: , Rfl:    levothyroxine (SYNTHROID, LEVOTHROID) 75 MCG tablet, Take 75 mcg by mouth See admin instructions. Take 75 mcg by mouth daily before breakfast Monday through Saturday (Patient not taking: Reported on 07/24/2022), Disp: , Rfl:    losartan (COZAAR) 50 MG tablet, Take 50 mg by mouth daily., Disp: , Rfl:    methotrexate (RHEUMATREX) 2.5 MG tablet, TAKE 3 TABLETS (7.5 MG TOTAL) BY MOUTH EVERY 7 (SEVEN) DAYS, Disp: , Rfl:    mirtazapine (REMERON) 15 MG tablet, Take 15 mg by mouth at bedtime., Disp: ,  Rfl:    modafinil (PROVIGIL) 100 MG tablet, Take by mouth., Disp: , Rfl:    Multiple Vitamin tablet, Take 1 tablet by mouth daily., Disp: , Rfl:    Multiple Vitamins-Minerals (AIRBORNE PO), Take 2 tablets by mouth in the morning and at bedtime., Disp: , Rfl:    rosuvastatin (CRESTOR) 40 MG tablet, Take 40 mg by mouth at bedtime.  (Patient not taking: Reported on 07/24/2022), Disp: , Rfl:    rosuvastatin (CRESTOR) 40 MG tablet, Take 1 tablet by mouth daily., Disp: , Rfl:    sulfaSALAzine (AZULFIDINE) 500 MG tablet, Take 1,000 mg by mouth 2 (two) times daily., Disp: , Rfl:    sulfaSALAzine (AZULFIDINE) 500 MG tablet, Take 2 tablets by mouth 2 (two) times daily. (Patient not taking: Reported on  07/24/2022), Disp: , Rfl:    TURMERIC PO, Take 1 capsule by mouth daily. , Disp: , Rfl:   Past Medical History: Past Medical History:  Diagnosis Date   Arthritis    Chronic cough    Depression    Diabetes mellitus without complication (HCC)    Edema    LEGS/FEET   GERD (gastroesophageal reflux disease)    NO MEDS   Headache    H/O MIGRAINES   HOH (hard of hearing)    Hypertension    Hypothyroidism    IBS (irritable bowel syndrome)    Sleep apnea    CPAP    Tobacco Use: Social History   Tobacco Use  Smoking Status Never  Smokeless Tobacco Never    Labs: Review Flowsheet       Latest Ref Rng & Units 02/25/2018  Labs for ITP Cardiac and Pulmonary Rehab  Hemoglobin A1c 4.8 - 5.6 % 9.2      Pulmonary Assessment Scores:  Pulmonary Assessment Scores     Row Name 07/24/22 1652 10/20/22 1121       ADL UCSD   ADL Phase Entry Exit    SOB Score total 56 33    Rest 0 0    Walk 1 1    Stairs 2 3    Bath 0 0    Dress 1 2    Shop 5 1      CAT Score   CAT Score 18 17      mMRC Score   mMRC Score 2 1             UCSD: Self-administered rating of dyspnea associated with activities of daily living (ADLs) 6-point scale (0 = "not at all" to 5 = "maximal or unable to do because of breathlessness")  Scoring Scores range from 0 to 120.  Minimally important difference is 5 units  CAT: CAT can identify the health impairment of COPD patients and is better correlated with disease progression.  CAT has a scoring range of zero to 40. The CAT score is classified into four groups of low (less than 10), medium (10 - 20), high (21-30) and very high (31-40) based on the impact level of disease on health status. A CAT score over 10 suggests significant symptoms.  A worsening CAT score could be explained by an exacerbation, poor medication adherence, poor inhaler technique, or progression of COPD or comorbid conditions.  CAT MCID is 2 points  mMRC: mMRC (Modified Medical  Research Council) Dyspnea Scale is used to assess the degree of baseline functional disability in patients of respiratory disease due to dyspnea. No minimal important difference is established. A decrease in score of 1 point or  greater is considered a positive change.   Pulmonary Function Assessment:  Pulmonary Function Assessment - 07/24/22 1029       Breath   Shortness of Breath Yes;Limiting activity             Exercise Target Goals: Exercise Program Goal: Individual exercise prescription set using results from initial 6 min walk test and THRR while considering  patient's activity barriers and safety.   Exercise Prescription Goal: Initial exercise prescription builds to 30-45 minutes a day of aerobic activity, 2-3 days per week.  Home exercise guidelines will be given to patient during program as part of exercise prescription that the participant will acknowledge.  Education: Aerobic Exercise: - Group verbal and visual presentation on the components of exercise prescription. Introduces F.I.T.T principle from ACSM for exercise prescriptions.  Reviews F.I.T.T. principles of aerobic exercise including progression. Written material given at graduation.   Education: Resistance Exercise: - Group verbal and visual presentation on the components of exercise prescription. Introduces F.I.T.T principle from ACSM for exercise prescriptions  Reviews F.I.T.T. principles of resistance exercise including progression. Written material given at graduation. Flowsheet Row Pulmonary Rehab from 09/20/2022 in [facility] Cardiac and Pulmonary Rehab  Date 08/02/22  Educator [facility]  Instruction Review Code 1- Verbalizes Understanding        Education: Exercise & Equipment Safety: - Individual verbal instruction and demonstration of equipment use and safety with use of the equipment. Flowsheet Row Pulmonary Rehab from 09/20/2022 in [facility] Cardiac and Pulmonary Rehab  Date 07/24/22  Educator [facility]  Instruction  Review Code 1- Verbalizes Understanding       Education: Exercise Physiology & General Exercise Guidelines: - Group verbal and written instruction with models to review the exercise physiology of the cardiovascular system and associated critical values. Provides general exercise guidelines with specific guidelines to those with heart or lung disease.    Education: Flexibility, Balance, Mind/Body Relaxation: - Group verbal and visual presentation with interactive activity on the components of exercise prescription. Introduces F.I.T.T principle from ACSM for exercise prescriptions. Reviews F.I.T.T. principles of flexibility and balance exercise training including progression. Also discusses the mind body connection.  Reviews various relaxation techniques to help reduce and manage stress (i.e. Deep breathing, progressive muscle relaxation, and visualization). Balance handout provided to take home. Written material given at graduation. Flowsheet Row Pulmonary Rehab from 09/20/2022 in [facility] Cardiac and Pulmonary Rehab  Date 08/09/22  Educator [facility]  Instruction Review Code 1- Verbalizes Understanding       Activity Barriers & Risk Stratification:  Activity Barriers & Cardiac Risk Stratification - 07/24/22 1643       Activity Barriers & Cardiac Risk Stratification   Activity Barriers Back Problems;Neck/Spine Problems             6 Minute Walk:  6 Minute Walk     Row Name 07/24/22 1640 10/18/22 1120       6 Minute Walk   Phase Initial Initial    Distance 1215 feet 1345 feet    Distance % Change -- 10.7 %    Distance Feet Change -- 130 ft    Walk Time 6 minutes 6 minutes    # of Rest Breaks 0 0    MPH 2.3 2.55    METS 2.15 2.89    RPE 13 13    Perceived Dyspnea  1 1    VO2 Peak 7.54 10.11    Symptoms No Yes (comment)    Comments -- left hip pain 6/10  Resting HR 73 bpm 97 bpm    Resting BP 132/82 124/70    Resting Oxygen Saturation  96 % 97 %    Exercise Oxygen Saturation   during 6 min walk 93 % 96 %    Max Ex. HR 98 bpm 142 bpm    Max Ex. BP 140/80 148/76    2 Minute Post BP 128/70 132/66      Interval HR   1 Minute HR 81 113    2 Minute HR 95 124    3 Minute HR 98 130    4 Minute HR 97 133    5 Minute HR 97 140    6 Minute HR 95 142    2 Minute Post HR 69 96    Interval Heart Rate? Yes Yes      Interval Oxygen   Interval Oxygen? Yes Yes    Baseline Oxygen Saturation % 96 % 97 %    1 Minute Oxygen Saturation % 95 % 97 %    1 Minute Liters of Oxygen 0 L 0 L    2 Minute Oxygen Saturation % 93 % 97 %    2 Minute Liters of Oxygen 0 L 0 L    3 Minute Oxygen Saturation % 95 % 97 %    3 Minute Liters of Oxygen 0 L 0 L    4 Minute Oxygen Saturation % 96 % 98 %    4 Minute Liters of Oxygen 0 L 0 L    5 Minute Oxygen Saturation % 95 % 97 %    5 Minute Liters of Oxygen 0 L 0 L    6 Minute Oxygen Saturation % 96 % 97 %    6 Minute Liters of Oxygen 0 L 0 L    2 Minute Post Oxygen Saturation % 95 % 96 %    2 Minute Post Liters of Oxygen 0 L 0 L            Oxygen Initial Assessment:  Oxygen Initial Assessment - 07/24/22 1655       Home Oxygen   Home Oxygen Device None    Sleep Oxygen Prescription CPAP    Home Exercise Oxygen Prescription None    Home Resting Oxygen Prescription None    Compliance with Home Oxygen Use Yes      Initial 6 min Walk   Oxygen Used None      Program Oxygen Prescription   Program Oxygen Prescription None      Intervention   Short Term Goals To learn and exhibit compliance with exercise, home and travel O2 prescription;To learn and understand importance of maintaining oxygen saturations>88%;To learn and demonstrate proper use of respiratory medications;To learn and demonstrate proper pursed lip breathing techniques or other breathing techniques. ;To learn and understand importance of monitoring SPO2 with pulse oximeter and demonstrate accurate use of the pulse oximeter.    Long  Term Goals Exhibits compliance with  exercise, home  and travel O2 prescription;Verbalizes importance of monitoring SPO2 with pulse oximeter and return demonstration;Exhibits proper breathing techniques, such as pursed lip breathing or other method taught during program session;Maintenance of O2 saturations>88%;Compliance with respiratory medication;Demonstrates proper use of MDI's             Oxygen Re-Evaluation:  Oxygen Re-Evaluation     Row Name 07/28/22 1036 08/09/22 1151 08/16/22 1146 09/13/22 1130       Program Oxygen Prescription   Program Oxygen Prescription None None None None  Home Oxygen   Home Oxygen Device None None None None    Sleep Oxygen Prescription None None CPAP;None CPAP;None    Home Exercise Oxygen Prescription None None None None    Home Resting Oxygen Prescription None None None None    Compliance with Home Oxygen Use -- -- Yes Yes      Goals/Expected Outcomes   Short Term Goals To learn and demonstrate proper pursed lip breathing techniques or other breathing techniques.  To learn and demonstrate proper pursed lip breathing techniques or other breathing techniques.  To learn and demonstrate proper pursed lip breathing techniques or other breathing techniques. ;To learn and understand importance of monitoring SPO2 with pulse oximeter and demonstrate accurate use of the pulse oximeter.;To learn and understand importance of maintaining oxygen saturations>88%;To learn and demonstrate proper use of respiratory medications To learn and demonstrate proper pursed lip breathing techniques or other breathing techniques. ;To learn and understand importance of monitoring SPO2 with pulse oximeter and demonstrate accurate use of the pulse oximeter.;To learn and understand importance of maintaining oxygen saturations>88%;To learn and demonstrate proper use of respiratory medications    Long  Term Goals Exhibits proper breathing techniques, such as pursed lip breathing or other method taught during program  session Exhibits proper breathing techniques, such as pursed lip breathing or other method taught during program session Exhibits proper breathing techniques, such as pursed lip breathing or other method taught during program session;Maintenance of O2 saturations>88%;Verbalizes importance of monitoring SPO2 with pulse oximeter and return demonstration;Compliance with respiratory medication;Demonstrates proper use of MDI's Exhibits proper breathing techniques, such as pursed lip breathing or other method taught during program session;Maintenance of O2 saturations>88%;Verbalizes importance of monitoring SPO2 with pulse oximeter and return demonstration;Compliance with respiratory medication;Demonstrates proper use of MDI's    Comments Reviewed PLB technique with pt.  Talked about how it works and it's importance in maintaining their exercise saturations. Diaphragmatic and PLB breathing explained and performed with patient. Patient has a better understanding of how to do these exercises to help with breathing performance and relaxation. Patient performed breathing techniques adequately and to practice further at home. Yasmin is doing well in rehab. She is doing better with her breathing. She tries to ues her PLB.  She is compliant with her CPAP each night.  She does check her saturations at home and has been doing well. She is also good about using her inhaler each day. Normajean is doing well in rehab. She states that she feels she is doing better with her breathing overall. We reviewed PLB and she reports that she has been practicing. She has been using her CPAP most nights as well. She has not been checking her saturations at home, but we encouraged her to do so especially with beginning to walk at home.    Goals/Expected Outcomes Short: Become more profiecient at using PLB. Long: Become independent at using PLB. Short: practice PLB and diaphragmatic breathing at home. Long: Use PLB and diaphragmatic breathing  independently post [employer]. Short: Continue to practice PLB to get better Long: continued compliance with CPAP Short: Continue to practice PLB and begin to check saturations at home. Long: Continued compliance with CPAP.             Oxygen Discharge (Final Oxygen Re-Evaluation):  Oxygen Re-Evaluation - 09/13/22 1130       Program Oxygen Prescription   Program Oxygen Prescription None      Home Oxygen   Home Oxygen Device None    Sleep Oxygen  Prescription CPAP;None    Home Exercise Oxygen Prescription None    Home Resting Oxygen Prescription None    Compliance with Home Oxygen Use Yes      Goals/Expected Outcomes   Short Term Goals To learn and demonstrate proper pursed lip breathing techniques or other breathing techniques. ;To learn and understand importance of monitoring SPO2 with pulse oximeter and demonstrate accurate use of the pulse oximeter.;To learn and understand importance of maintaining oxygen saturations>88%;To learn and demonstrate proper use of respiratory medications    Long  Term Goals Exhibits proper breathing techniques, such as pursed lip breathing or other method taught during program session;Maintenance of O2 saturations>88%;Verbalizes importance of monitoring SPO2 with pulse oximeter and return demonstration;Compliance with respiratory medication;Demonstrates proper use of MDI's    Comments Mireille is doing well in rehab. She states that she feels she is doing better with her breathing overall. We reviewed PLB and she reports that she has been practicing. She has been using her CPAP most nights as well. She has not been checking her saturations at home, but we encouraged her to do so especially with beginning to walk at home.    Goals/Expected Outcomes Short: Continue to practice PLB and begin to check saturations at home. Long: Continued compliance with CPAP.             Initial Exercise Prescription:  Initial Exercise Prescription - 07/24/22 1600        Date of Initial Exercise RX and Referring Provider   Date 07/24/22    Referring Provider Aleskerov      Oxygen   Maintain Oxygen Saturation 88% or higher      Treadmill   MPH 1.5    Grade 1    Minutes 15    METs 2.35      NuStep   Level 2    SPM 80    Minutes 15    METs 2.15      REL-XR   Level 2    Speed 50    Minutes 15    METs 2.15      Biostep-RELP   Level 2    SPM 50    Minutes 15    METs 2.15      Intensity   THRR 40-80% of Max Heartrate 102-132    Ratings of Perceived Exertion 11-13    Perceived Dyspnea 0-4      Progression   Progression Continue to progress workloads to maintain intensity without signs/symptoms of physical distress.      Resistance Training   Training Prescription Yes    Weight 2    Reps 10-15             Perform Capillary Blood Glucose checks as needed.  Exercise Prescription Changes:   Exercise Prescription Changes     Row Name 07/24/22 1600 07/31/22 1000 08/14/22 1300 08/16/22 1100 08/28/22 0900     Response to Exercise   Blood Pressure (Admit) 132/82 140/60 110/62 -- 124/82   Blood Pressure (Exercise) 140/80 132/62 144/68 -- 166/72   Blood Pressure (Exit) 128/70 124/72 102/62 -- 120/72   Heart Rate (Admit) 73 bpm 83 bpm 92 bpm -- 81 bpm   Heart Rate (Exercise) 98 bpm 93 bpm 109 bpm -- 133 bpm   Heart Rate (Exit) 69 bpm 85 bpm 75 bpm -- 92 bpm   Oxygen Saturation (Admit) 96 % 95 % 96 % -- 98 %   Oxygen Saturation (Exercise) 93 % 96 % 94 % --  95 %   Oxygen Saturation (Exit) 95 % 95 % 96 % -- 97 %   Rating of Perceived Exertion (Exercise) 13 9 16  -- 13   Perceived Dyspnea (Exercise) 1 -- -- -- 2   Symptoms 3/10 left hip pain none none -- SOB   Comments 6 MWT results 1st full day of exercise -- -- --   Duration -- Progress to 30 minutes of  aerobic without signs/symptoms of physical distress Continue with 30 min of aerobic exercise without signs/symptoms of physical distress. -- Progress to 30 minutes of  aerobic without  signs/symptoms of physical distress   Intensity -- THRR unchanged THRR unchanged -- THRR unchanged     Progression   Progression -- Continue to progress workloads to maintain intensity without signs/symptoms of physical distress. Continue to progress workloads to maintain intensity without signs/symptoms of physical distress. -- Continue to progress workloads to maintain intensity without signs/symptoms of physical distress.   Average METs -- 2.5 2.34 -- 2.9     Resistance Training   Training Prescription -- Yes Yes -- Yes   Weight -- 2 lb 2 lb -- 2 lb   Reps -- 10-15 10-15 -- 10-15     Interval Training   Interval Training -- No No -- No     Treadmill   MPH -- -- 2.3 -- 2.5   Grade -- -- 0 -- 0.5   Minutes -- -- 15 -- 15   METs -- -- -- -- 3.09     Recumbant Bike   Level -- -- -- -- 5   Watts -- -- -- -- 37   Minutes -- -- -- -- 15   METs -- -- -- -- 3.15     NuStep   Level -- 2 2 -- 5   Minutes -- 15 15 -- 15   METs -- -- 2.8 -- 4     REL-XR   Level -- -- 2 -- 6   Minutes -- -- 15 -- 15   METs -- -- 2 -- --     Biostep-RELP   Level -- 2 -- -- --   Minutes -- 15 -- -- --   METs -- 3 -- -- --     Home Exercise Plan   Plans to continue exercise at -- -- -- Home (comment)  walking, staff videos Home (comment)  walking, staff videos   Frequency -- -- -- Add 2 additional days to program exercise sessions. Add 2 additional days to program exercise sessions.   Initial Home Exercises Provided -- -- -- 08/16/22 08/16/22     Oxygen   Maintain Oxygen Saturation -- 88% or higher 88% or higher -- 88% or higher    Row Name 09/11/22 1300 09/25/22 0900 10/09/22 1300 10/23/22 1100       Response to Exercise   Blood Pressure (Admit) 126/62 114/64 118/68 124/62    Blood Pressure (Exit) 122/62 124/62 128/70 108/60    Heart Rate (Admit) 64 bpm 85 bpm 75 bpm 78 bpm    Heart Rate (Exercise) 124 bpm 103 bpm 113 bpm 120 bpm    Heart Rate (Exit) 96 bpm 83 bpm 91 bpm 99 bpm    Oxygen  Saturation (Admit) 95 % 97 % 95 % 97 %    Oxygen Saturation (Exercise) 93 % 94 % 93 % 88 %    Oxygen Saturation (Exit) 98 % 96 % 96 % 98 %    Rating of Perceived Exertion (Exercise) 12  13 13 13     Perceived Dyspnea (Exercise) 2 -- 2 1    Symptoms SOB none none SOB    Duration Progress to 30 minutes of  aerobic without signs/symptoms of physical distress Continue with 30 min of aerobic exercise without signs/symptoms of physical distress. Continue with 30 min of aerobic exercise without signs/symptoms of physical distress. Continue with 30 min of aerobic exercise without signs/symptoms of physical distress.    Intensity THRR unchanged THRR unchanged THRR unchanged THRR unchanged      Progression   Progression Continue to progress workloads to maintain intensity without signs/symptoms of physical distress. Continue to progress workloads to maintain intensity without signs/symptoms of physical distress. Continue to progress workloads to maintain intensity without signs/symptoms of physical distress. Continue to progress workloads to maintain intensity without signs/symptoms of physical distress.    Average METs 3.21 3.84 3.68 2.84      Resistance Training   Training Prescription Yes Yes Yes Yes    Weight 2 lb 2 lb 2 lb 2 lb    Reps 10-15 10-15 10-15 10-15      Interval Training   Interval Training No No No No      Treadmill   MPH 2 2 2 2     Grade 2 2 2  0    Minutes 15 15 11 15     METs 3.08 3.08 3.08 2.53      NuStep   Level 6 6 6 6     Minutes 15 15 15 15     METs 4.1 3.7 3.8 3.2      Elliptical   Level -- -- 1 --    Speed -- -- 1 --    Minutes -- -- 3 --      REL-XR   Level 5 6 7 5     Minutes 15 15 15 15     METs 2.9 6.1 3 --      Home Exercise Plan   Plans to continue exercise at Home (comment)  walking, staff videos Home (comment)  walking, staff videos Home (comment)  walking, staff videos Home (comment)  walking, staff videos    Frequency Add 2 additional days to program  exercise sessions. Add 2 additional days to program exercise sessions. Add 2 additional days to program exercise sessions. Add 2 additional days to program exercise sessions.    Initial Home Exercises Provided 08/16/22 08/16/22 08/16/22 08/16/22      Oxygen   Maintain Oxygen Saturation 88% or higher 88% or higher 88% or higher 88% or higher             Exercise Comments:   Exercise Comments     Row Name 07/28/22 1035           Exercise Comments First full day of exercise!  Patient was oriented to gym and equipment including functions, settings, policies, and procedures.  Patient's individual exercise prescription and treatment plan were reviewed.  All starting workloads were established based on the results of the 6 minute walk test done at initial orientation visit.  The plan for exercise progression was also introduced and progression will be customized based on patient's performance and goals.                Exercise Goals and Review:   Exercise Goals     Row Name 07/24/22 1647             Exercise Goals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 Pulmonary Rehabilitation for the exercise     Continue Psychosocial Services  Follow up required by staff Follow up required by staff Follow up required by staff       Initial Review   Source of Stress Concerns Unable to participate in former interests or hobbies;Unable to perform yard/household activities;Family Unable to participate in former interests or hobbies;Unable to perform yard/household activities;Family --              Psychosocial Discharge (Final Psychosocial Re-Evaluation):  Psychosocial Re-Evaluation - 10/13/22 1107       Psychosocial Re-Evaluation   Current issues with Current Depression;Current Anxiety/Panic;Current Stress Concerns;History of Depression    Comments Reviewed patient health questionnaire (PHQ-9) with patient for follow up. Previously, patients score indicated signs/symptoms of depression.  Reviewed to see if patient is improving symptom wise while in program.  Score improved declined and patient states that it is because she is aches alot. She has headaches and body aches often.    Expected Outcomes Short: Continue to attend [facility] regularly for regular exercise and social engagement. Long: Continue to improve symptoms and manage a positive mental state.    Interventions Encouraged to attend Pulmonary Rehabilitation for the exercise    Continue Psychosocial Services  Follow up required by staff             Education: Education Goals: Education classes will be provided on a weekly basis, covering required topics. Participant will state understanding/return demonstration of topics presented.  Learning Barriers/Preferences:  Learning Barriers/Preferences - 07/24/22 1030       Learning Barriers/Preferences   Learning Barriers None    Learning Preferences None             General Pulmonary Education Topics:  Infection Prevention: - Provides verbal and written material to  individual with discussion of infection control including proper hand washing and proper equipment cleaning during exercise session. Flowsheet Row Pulmonary Rehab from 09/20/2022 in [facility] Cardiac and Pulmonary Rehab  Date 07/24/22  Educator [facility]  Instruction Review Code 1- Verbalizes Understanding       Falls Prevention: - Provides verbal and written material to individual with discussion of falls prevention and safety. Flowsheet Row Pulmonary Rehab from 09/20/2022 in [facility] Cardiac and Pulmonary Rehab  Date 07/24/22  Educator [facility]  Instruction Review Code 1- Verbalizes Understanding       Chronic Lung Disease Review: - Group verbal instruction with posters, models, PowerPoint presentations and videos,  to review new updates, new respiratory medications, new advancements in procedures and treatments. Providing information on websites and "800" numbers for continued self-education. Includes information about supplement oxygen, available portable oxygen systems, continuous and intermittent flow rates, oxygen safety, concentrators, and Medicare reimbursement for oxygen. Explanation of Pulmonary Drugs, including class, frequency, complications, importance of spacers, rinsing mouth after steroid MDI's, and proper cleaning methods for nebulizers. Review of basic lung anatomy and  physiology related to function, structure, and complications of lung disease. Review of risk factors. Discussion about methods for diagnosing sleep apnea and types of masks and machines for OSA. Includes a review of the use of types of environmental controls: home humidity, furnaces, filters, dust mite/pet prevention, HEPA vacuums. Discussion about weather changes, air quality and the benefits of nasal washing. Instruction on Warning signs, infection symptoms, calling MD promptly, preventive modes, and value of vaccinations. Review of effective airway clearance, coughing and/or vibration techniques. Emphasizing that all should Create an  Action Plan. Written material given at graduation. Flowsheet Row Pulmonary Rehab from 09/20/2022 in [facility] Cardiac and Pulmonary Rehab  Education need identified 07/24/22  Date 09/13/22  Educator [facility]  Instruction Review Code 1- Verbalizes Understanding       AED/CPR: - Group verbal and written instruction with the use of models to demonstrate the basic use of the AED with the basic ABC's of resuscitation.    Anatomy and Cardiac Procedures: - Group verbal and visual presentation and models provide information about basic cardiac anatomy and function. Reviews the testing methods done to diagnose heart disease and the outcomes of the test results. Describes the treatment choices: Medical Management, Angioplasty, or Coronary Bypass Surgery for treating various heart conditions including Myocardial Infarction, Angina, Valve Disease, and Cardiac Arrhythmias.  Written material given at graduation.   Medication Safety: - Group verbal and visual instruction to review commonly prescribed medications for heart and lung disease. Reviews the medication, class of the drug, and side effects. Includes the steps to properly store meds and maintain the prescription regimen.  Written material given at graduation.   Other: -Provides group and verbal instruction on various topics (see comments)   Knowledge Questionnaire Score:  Knowledge Questionnaire Score - 10/20/22 1121       Knowledge Questionnaire Score   Pre Score 14/18    Post Score 17/18              Core Components/Risk Factors/Patient Goals at Admission:  Personal Goals and Risk Factors at Admission - 07/24/22 1650       Core Components/Risk Factors/Patient Goals on Admission    Weight Management Yes;Weight Loss    Intervention Weight Management: Develop a combined nutrition and exercise program designed to reach desired caloric intake, while maintaining appropriate intake of nutrient and fiber, sodium and fats, and appropriate energy  expenditure required for the weight goal.;Weight Management: Provide education and appropriate resources to help participant work on and attain dietary goals.;Obesity: Provide education and appropriate resources to help participant work on and attain dietary goals.;Weight Management/Obesity: Establish reasonable short term and long term weight goals.    Admit Weight 220 lb 14.4 oz (100.2 kg)    Goal Weight: Short Term 210 lb (95.3 kg)    Goal Weight: Long Term 200 lb (90.7 kg)    Expected Outcomes Short Term: Continue to assess and modify interventions until short term weight is achieved;Long Term: Adherence to nutrition and physical activity/exercise program aimed toward attainment of established weight goal;Weight Loss: Understanding of general recommendations for a balanced deficit meal plan, which promotes 1-2 lb weight loss per week and includes a negative energy balance of [phone] kcal/d;Understanding recommendations for meals to include 15-35% energy as protein, 25-35% energy from fat, 35-60% energy from carbohydrates, less than 200mg  of dietary cholesterol, 20-35 gm of total fiber daily;Understanding of distribution of calorie intake throughout the day with the consumption of 4-5 meals/snacks    Improve shortness of breath with ADL's Yes  Intervention Provide education, individualized exercise plan and daily activity instruction to help decrease symptoms of SOB with activities of daily living.    Expected Outcomes Short Term: Improve cardiorespiratory fitness to achieve a reduction of symptoms when performing ADLs;Long Term: Be able to perform more ADLs without symptoms or delay the onset of symptoms    Diabetes Yes    Intervention Provide education about signs/symptoms and action to take for hypo/hyperglycemia.;Provide education about proper nutrition, including hydration, and aerobic/resistive exercise prescription along with prescribed medications to achieve blood glucose in normal ranges:  Fasting glucose 65-99 mg/dL    Expected Outcomes Short Term: Participant verbalizes understanding of the signs/symptoms and immediate care of hyper/hypoglycemia, proper foot care and importance of medication, aerobic/resistive exercise and nutrition plan for blood glucose control.;Long Term: Attainment of HbA1C < 7%.    Hypertension Yes    Intervention Provide education on lifestyle modifcations including regular physical activity/exercise, weight management, moderate sodium restriction and increased consumption of fresh fruit, vegetables, and low fat dairy, alcohol moderation, and smoking cessation.;Monitor prescription use compliance.    Expected Outcomes Short Term: Continued assessment and intervention until BP is < 140/27mm HG in hypertensive participants. < 130/34mm HG in hypertensive participants with diabetes, heart failure or chronic kidney disease.;Long Term: Maintenance of blood pressure at goal levels.    Lipids Yes    Intervention Provide education and support for participant on nutrition & aerobic/resistive exercise along with prescribed medications to achieve LDL 70mg , HDL >40mg .    Expected Outcomes Short Term: Participant states understanding of desired cholesterol values and is compliant with medications prescribed. Participant is following exercise prescription and nutrition guidelines.;Long Term: Cholesterol controlled with medications as prescribed, with individualized exercise RX and with personalized nutrition plan. Value goals: LDL < , HDL > 40 mg.             Education:Diabetes - Individual verbal and written instruction to review signs/symptoms of diabetes, desired ranges of glucose level fasting, after meals and with exercise. Acknowledge that pre and post exercise glucose checks will be done for 3 sessions at entry of program. Flowsheet Row Pulmonary Rehab from 09/20/2022 in [facility] Cardiac and Pulmonary Rehab  Date 07/24/22  Educator [facility]  Instruction Review Code 1-  Verbalizes Understanding       Know Your Numbers and Heart Failure: - Group verbal and visual instruction to discuss disease risk factors for cardiac and pulmonary disease and treatment options.  Reviews associated critical values for Overweight/Obesity, Hypertension, Cholesterol, and Diabetes.  Discusses basics of heart failure: signs/symptoms and treatments.  Introduces Heart Failure Zone chart for action plan for heart failure.  Written material given at graduation.   Core Components/Risk Factors/Patient Goals Review:   Goals and Risk Factor Review     Row Name 08/16/22 1142 09/13/22 1122           Core Components/Risk Factors/Patient Goals Review   Personal Goals Review Weight Management/Obesity;Improve shortness of breath with ADL's;Diabetes;Increase knowledge of respiratory medications and ability to use respiratory devices properly.;Hypertension Weight Management/Obesity;Improve shortness of breath with ADL's;Diabetes;Increase knowledge of respiratory medications and ability to use respiratory devices properly.;Hypertension      Review Mirranda is doing well in rehab.  Her weight has been yoyoing. She will go down 4 then up 5 then down 2 and up 3.  She is trying to find a balance and maintain the downward trend.  Her breathing id doing well and she is using her inhaler like she is supposed to.  Her sugars  have been running higher than normal.  She really wants to work to get better control over her sugars and will set up an appointment with dietitan to help.  Her pressures are doing well and she does check them at home. Donice is still working on losing some weight. She weighed in to day at 228.3 lb but would like to get down to 200 lb. She also reports that she is checking her blood sugars at home and states that they are progressing and she is gaining control over them. She also reports that her breathing has improved and she has been practicing PLB technique. She has not been checking her BP  at home but states that she does own a BP cuff but has not been using it due to her readings being good while in rehab.      Expected Outcomes short: Continue to work on better blood sugar control to help with weight loss Long; continue to monitor risk factors. Short: Continue to work on improving blood sugars. Long: Continue to manage lifestyle risk factors.               Core Components/Risk Factors/Patient Goals at Discharge (Final Review):   Goals and Risk Factor Review - 09/13/22 1122       Core Components/Risk Factors/Patient Goals Review   Personal Goals Review Weight Management/Obesity;Improve shortness of breath with ADL's;Diabetes;Increase knowledge of respiratory medications and ability to use respiratory devices properly.;Hypertension    Review Neilah is still working on losing some weight. She weighed in to day at 228.3 lb but would like to get down to 200 lb. She also reports that she is checking her blood sugars at home and states that they are progressing and she is gaining control over them. She also reports that her breathing has improved and she has been practicing PLB technique. She has not been checking her BP at home but states that she does own a BP cuff but has not been using it due to her readings being good while in rehab.    Expected Outcomes Short: Continue to work on improving blood sugars. Long: Continue to manage lifestyle risk factors.             ITP Comments:  ITP Comments     Row Name 07/24/22 1028 07/24/22 1640 07/28/22 1035 08/02/22 0854 08/17/22 0921   ITP Comments Virtual Visit completed. Patient informed on EP and RD appointment and 6 Minute walk test. Patient also informed of patient health questionnaires on My Chart. Patient Verbalizes understanding. Visit diagnosis can be found in [facility] 07/12/22. Completed and gym orientation. Initial ITP created and sent for review to Dr. Jinny Sanders, Medical Director. First full day of exercise!  Patient was  oriented to gym and equipment including functions, settings, policies, and procedures.  Patient's individual exercise prescription and treatment plan were reviewed.  All starting workloads were established based on the results of the 6 minute walk test done at initial orientation visit.  The plan for exercise progression was also introduced and progression will be customized based on patient's performance and goals. 30 Day review completed. Medical Director ITP review done, changes made as directed, and signed approval by Medical Director.   new to program Completed initial RD consultation    Row Name 08/30/22 0749 09/27/22 0746 10/25/22 0742       ITP Comments 30 day review completed. ITP sent to Dr. Jinny Sanders, Medical Director of  Pulmonary Rehab. Continue with ITP unless  changes are made by physician. 30 Day review completed. Medical Director ITP review done, changes made as directed, and signed approval by Medical Director. 30 day review completed. ITP sent to Dr. Jinny Sanders, Medical Director of  Pulmonary Rehab. Continue with ITP unless changes are made by physician.              Comments: 30 day review

## 2022-10-27 DIAGNOSIS — D860 Sarcoidosis of lung: Secondary | ICD-10-CM | POA: Diagnosis not present

## 2022-10-27 NOTE — Progress Notes (Signed)
Daily Session Note  Patient Details  Name: Kaitlin Kelley MRN: [id number] Date of Birth: Jun 23, 1951 Referring Provider:   Flowsheet Row Pulmonary Rehab from 07/24/2022 in [facility] Cardiac and Pulmonary Rehab  Referring Provider Karna Christmas       Encounter Date: 10/27/2022  Check In:  Session Check In - 10/27/22 1137       Check-In   Supervising physician immediately available to respond to emergencies See telemetry face sheet for immediately available ER MD    Location [facility]-Cardiac & Pulmonary Rehab    Staff Present Harlene Ramus, RN, BSN;Joseph Toledo, RCP,RRT,BSRT;Jessica [facility] [place], [place], RCEP, CCRP, CCET    Virtual Visit No    Medication changes reported     No    Fall or balance concerns reported    No    Tobacco Cessation No Change    Warm-up and Cool-down Performed on first and last piece of equipment    Resistance Training Performed Yes    VAD Patient? No    PAD/SET Patient? No      Pain Assessment   Currently in Pain? No/denies                Social History   Tobacco Use  Smoking Status Never  Smokeless Tobacco Never    Goals Met:  Proper associated with RPD/PD & O2 Sat Independence with exercise equipment Using PLB without cueing & demonstrates good technique Exercise tolerated well No report of concerns or symptoms today Strength training completed today  Goals Unmet:  Not Applicable  Comments: Pt able to follow exercise prescription today without complaint.  Will continue to monitor for progression.   Dr. Bethann Punches is Medical Director for [facility] Cardiac Rehabilitation.  Dr. Vida Rigger is Medical Director for [facility] Pulmonary Rehabilitation.

## 2022-10-30 ENCOUNTER — Encounter: Payer: Medicare Other | Admitting: *Deleted

## 2022-10-30 DIAGNOSIS — D860 Sarcoidosis of lung: Secondary | ICD-10-CM

## 2022-10-30 NOTE — Progress Notes (Signed)
Daily Session Note  Patient Details  Name: Kaitlin Kelley MRN: [id number] Date of Birth: 1950/11/10 Referring Provider:   Flowsheet Row Pulmonary Rehab from 07/24/2022 in [facility] Cardiac and Pulmonary Rehab  Referring Provider Karna Christmas       Encounter Date: 10/30/2022  Check In:  Session Check In - 10/30/22 1121       Check-In   Supervising physician immediately available to respond to emergencies See telemetry face sheet for immediately available ER MD    Location [facility]-Cardiac & Pulmonary Rehab    Staff Present Lanny Hurst, RN, Franki Monte, BS, ACSM CEP, Exercise Physiologist;Noah Tickle, BS, Exercise Physiologist    Virtual Visit No    Medication changes reported     No    Fall or balance concerns reported    No    Warm-up and Cool-down Performed on first and last piece of equipment    Resistance Training Performed Yes    VAD Patient? No    PAD/SET Patient? No      Pain Assessment   Currently in Pain? No/denies                Social History   Tobacco Use  Smoking Status Never  Smokeless Tobacco Never    Goals Met:  Independence with exercise equipment Exercise tolerated well No report of concerns or symptoms today Strength training completed today  Goals Unmet:  Not Applicable  Comments:  Kaitlin Kelley graduated today from  rehab with 36 sessions completed.  Details of the patient's exercise prescription and what She needs to do in order to continue the prescription and progress were discussed with patient.  Patient was given a copy of prescription and goals.  Patient verbalized understanding.  Kaitlin Kelley plans to continue to exercise by walking and staff videos.    Dr. Bethann Punches is Medical Director for [facility] Cardiac Rehabilitation.  Dr. Vida Rigger is Medical Director for [facility] Pulmonary Rehabilitation.

## 2022-10-30 NOTE — Progress Notes (Signed)
Pulmonary Individual Treatment Plan  Patient Details  Name: Kaitlin Kelley MRN: [id number] Date of Birth: 09-Aug-1950 Referring Provider:   Flowsheet Row Pulmonary Rehab from 07/24/2022 in [facility] Cardiac and Pulmonary Rehab  Referring Provider Karna Christmas       Initial Encounter Date:  Flowsheet Row Pulmonary Rehab from 07/24/2022 in [facility] Cardiac and Pulmonary Rehab  Date 07/24/22       Visit Diagnosis: Sarcoidosis of lung  Patient's Home Medications on Admission:  Current Outpatient Medications:    amLODipine (NORVASC) 5 MG tablet, Take 5 mg by mouth at bedtime. , Disp: , Rfl:    azelastine (ASTELIN) 0.1 % nasal spray, Place 1 spray into both nostrils as needed for allergies., Disp: , Rfl:    Bacillus Coagulans-Inulin (ALIGN PREBIOTIC-PROBIOTIC PO), Take 1 capsule by mouth daily., Disp: , Rfl:    calcium-vitamin D (OSCAL WITH D) 500-5 MG-MCG tablet, Take 1 tablet by mouth. , Disp: , Rfl:    Cholecalciferol 50 MCG (2000 UT) CAPS, Take 2,000 Units by mouth daily. , Disp: , Rfl:    Collagen-Vitamin C-Biotin (COLLAGEN 1500/C) 500-50-0.8 MG CAPS, daily., Disp: , Rfl:    cyanocobalamin 100 MCG tablet, Take by mouth., Disp: , Rfl:    Cyanocobalamin 3000 MCG CAPS, Take 3,000 mcg by mouth daily. , Disp: , Rfl:    dicyclomine (BENTYL) 10 MG capsule, Take 10 mg by mouth 4 (four) times daily as needed for spasms. , Disp: , Rfl:    dorzolamide (TRUSOPT) 2 % ophthalmic solution, Place 1 drop into both eyes in the morning and at bedtime. , Disp: , Rfl:    Dulaglutide (TRULICITY) 1.5 MG/0.5ML SOPN, Inject 0.5 mLs into the skin once a week. (Patient not taking: Reported on 02/19/2020), Disp: , Rfl:    DULoxetine (CYMBALTA) 60 MG capsule, Take 60 mg by mouth 2 (two) times daily. , Disp: , Rfl:    ferrous gluconate (FERGON) 240 (27 FE) MG tablet, Take by mouth., Disp: , Rfl:    folic acid (FOLVITE) 1 MG tablet, Take 1 mg by mouth daily., Disp: , Rfl:    gabapentin (NEURONTIN) 300 MG capsule, Take  300 mg by mouth 3 (three) times daily., Disp: , Rfl:    glipiZIDE (GLUCOTROL) 10 MG tablet, Take 10 mg by mouth in the morning and at bedtime. , Disp: , Rfl:    insulin detemir (LEVEMIR) 100 UNIT/ML injection, Inject 30 Units into the skin at bedtime. , Disp: , Rfl:    Insulin Glargine-Lixisenatide (SOLIQUA) 100-33 UNT-MCG/ML SOPN, Inject 30 Units into the skin at bedtime., Disp: , Rfl:    Lactobacillus 0.05-0.05 MG TABS, Take by mouth., Disp: , Rfl:    Lancets 28G MISC, use to check blood sugar three to four times a day or as directed (Patient not taking: Reported on 07/24/2022), Disp: , Rfl:    levothyroxine (SYNTHROID) 75 MCG tablet, Take 1 tablet by mouth daily. (Patient not taking: Reported on 07/24/2022), Disp: , Rfl:    levothyroxine (SYNTHROID, LEVOTHROID) 75 MCG tablet, Take 75 mcg by mouth See admin instructions. Take 75 mcg by mouth daily before breakfast Monday through Saturday (Patient not taking: Reported on 07/24/2022), Disp: , Rfl:    losartan (COZAAR) 50 MG tablet, Take 50 mg by mouth daily., Disp: , Rfl:    methotrexate (RHEUMATREX) 2.5 MG tablet, TAKE 3 TABLETS (7.5 MG TOTAL) BY MOUTH EVERY 7 (SEVEN) DAYS, Disp: , Rfl:    mirtazapine (REMERON) 15 MG tablet, Take 15 mg by mouth at bedtime., Disp: ,  Rfl:    modafinil (PROVIGIL) 100 MG tablet, Take by mouth., Disp: , Rfl:    Multiple Vitamin tablet, Take 1 tablet by mouth daily., Disp: , Rfl:    Multiple Vitamins-Minerals (AIRBORNE PO), Take 2 tablets by mouth in the morning and at bedtime., Disp: , Rfl:    rosuvastatin (CRESTOR) 40 MG tablet, Take 40 mg by mouth at bedtime.  (Patient not taking: Reported on 07/24/2022), Disp: , Rfl:    rosuvastatin (CRESTOR) 40 MG tablet, Take 1 tablet by mouth daily., Disp: , Rfl:    sulfaSALAzine (AZULFIDINE) 500 MG tablet, Take 1,000 mg by mouth 2 (two) times daily., Disp: , Rfl:    sulfaSALAzine (AZULFIDINE) 500 MG tablet, Take 2 tablets by mouth 2 (two) times daily. (Patient not taking: Reported on  07/24/2022), Disp: , Rfl:    TURMERIC PO, Take 1 capsule by mouth daily. , Disp: , Rfl:   Past Medical History: Past Medical History:  Diagnosis Date   Arthritis    Chronic cough    Depression    Diabetes mellitus without complication (HCC)    Edema    LEGS/FEET   GERD (gastroesophageal reflux disease)    NO MEDS   Headache    H/O MIGRAINES   HOH (hard of hearing)    Hypertension    Hypothyroidism    IBS (irritable bowel syndrome)    Sleep apnea    CPAP    Tobacco Use: Social History   Tobacco Use  Smoking Status Never  Smokeless Tobacco Never    Labs: Review Flowsheet       Latest Ref Rng & Units 02/25/2018  Labs for ITP Cardiac and Pulmonary Rehab  Hemoglobin A1c 4.8 - 5.6 % 9.2      Pulmonary Assessment Scores:  Pulmonary Assessment Scores     Row Name 07/24/22 1652 10/20/22 1121       ADL UCSD   ADL Phase Entry Exit    SOB Score total 56 33    Rest 0 0    Walk 1 1    Stairs 2 3    Bath 0 0    Dress 1 2    Shop 5 1      CAT Score   CAT Score 18 17      mMRC Score   mMRC Score 2 1             UCSD: Self-administered rating of dyspnea associated with activities of daily living (ADLs) 6-point scale (0 = "not at all" to 5 = "maximal or unable to do because of breathlessness")  Scoring Scores range from 0 to 120.  Minimally important difference is 5 units  CAT: CAT can identify the health impairment of COPD patients and is better correlated with disease progression.  CAT has a scoring range of zero to 40. The CAT score is classified into four groups of low (less than 10), medium (10 - 20), high (21-30) and very high (31-40) based on the impact level of disease on health status. A CAT score over 10 suggests significant symptoms.  A worsening CAT score could be explained by an exacerbation, poor medication adherence, poor inhaler technique, or progression of COPD or comorbid conditions.  CAT MCID is 2 points  mMRC: mMRC (Modified Medical  Research Council) Dyspnea Scale is used to assess the degree of baseline functional disability in patients of respiratory disease due to dyspnea. No minimal important difference is established. A decrease in score of 1 point or  greater is considered a positive change.   Pulmonary Function Assessment:  Pulmonary Function Assessment - 07/24/22 1029       Breath   Shortness of Breath Yes;Limiting activity             Exercise Target Goals: Exercise Program Goal: Individual exercise prescription set using results from initial 6 min walk test and THRR while considering  patient's activity barriers and safety.   Exercise Prescription Goal: Initial exercise prescription builds to 30-45 minutes a day of aerobic activity, 2-3 days per week.  Home exercise guidelines will be given to patient during program as part of exercise prescription that the participant will acknowledge.  Education: Aerobic Exercise: - Group verbal and visual presentation on the components of exercise prescription. Introduces F.I.T.T principle from ACSM for exercise prescriptions.  Reviews F.I.T.T. principles of aerobic exercise including progression. Written material given at graduation.   Education: Resistance Exercise: - Group verbal and visual presentation on the components of exercise prescription. Introduces F.I.T.T principle from ACSM for exercise prescriptions  Reviews F.I.T.T. principles of resistance exercise including progression. Written material given at graduation. Flowsheet Row Pulmonary Rehab from 09/20/2022 in [facility] Cardiac and Pulmonary Rehab  Date 08/02/22  Educator [facility]  Instruction Review Code 1- Verbalizes Understanding        Education: Exercise & Equipment Safety: - Individual verbal instruction and demonstration of equipment use and safety with use of the equipment. Flowsheet Row Pulmonary Rehab from 09/20/2022 in [facility] Cardiac and Pulmonary Rehab  Date 07/24/22  Educator [facility]  Instruction  Review Code 1- Verbalizes Understanding       Education: Exercise Physiology & General Exercise Guidelines: - Group verbal and written instruction with models to review the exercise physiology of the cardiovascular system and associated critical values. Provides general exercise guidelines with specific guidelines to those with heart or lung disease.    Education: Flexibility, Balance, Mind/Body Relaxation: - Group verbal and visual presentation with interactive activity on the components of exercise prescription. Introduces F.I.T.T principle from ACSM for exercise prescriptions. Reviews F.I.T.T. principles of flexibility and balance exercise training including progression. Also discusses the mind body connection.  Reviews various relaxation techniques to help reduce and manage stress (i.e. Deep breathing, progressive muscle relaxation, and visualization). Balance handout provided to take home. Written material given at graduation. Flowsheet Row Pulmonary Rehab from 09/20/2022 in [facility] Cardiac and Pulmonary Rehab  Date 08/09/22  Educator [facility]  Instruction Review Code 1- Verbalizes Understanding       Activity Barriers & Risk Stratification:  Activity Barriers & Cardiac Risk Stratification - 07/24/22 1643       Activity Barriers & Cardiac Risk Stratification   Activity Barriers Back Problems;Neck/Spine Problems             6 Minute Walk:  6 Minute Walk     Row Name 07/24/22 1640 10/18/22 1120       6 Minute Walk   Phase Initial Initial    Distance 1215 feet 1345 feet    Distance % Change -- 10.7 %    Distance Feet Change -- 130 ft    Walk Time 6 minutes 6 minutes    # of Rest Breaks 0 0    MPH 2.3 2.55    METS 2.15 2.89    RPE 13 13    Perceived Dyspnea  1 1    VO2 Peak 7.54 10.11    Symptoms No Yes (comment)    Comments -- left hip pain 6/10  Resting HR 73 bpm 97 bpm    Resting BP 132/82 124/70    Resting Oxygen Saturation  96 % 97 %    Exercise Oxygen Saturation   during 6 min walk 93 % 96 %    Max Ex. HR 98 bpm 142 bpm    Max Ex. BP 140/80 148/76    2 Minute Post BP 128/70 132/66      Interval HR   1 Minute HR 81 113    2 Minute HR 95 124    3 Minute HR 98 130    4 Minute HR 97 133    5 Minute HR 97 140    6 Minute HR 95 142    2 Minute Post HR 69 96    Interval Heart Rate? Yes Yes      Interval Oxygen   Interval Oxygen? Yes Yes    Baseline Oxygen Saturation % 96 % 97 %    1 Minute Oxygen Saturation % 95 % 97 %    1 Minute Liters of Oxygen 0 L 0 L    2 Minute Oxygen Saturation % 93 % 97 %    2 Minute Liters of Oxygen 0 L 0 L    3 Minute Oxygen Saturation % 95 % 97 %    3 Minute Liters of Oxygen 0 L 0 L    4 Minute Oxygen Saturation % 96 % 98 %    4 Minute Liters of Oxygen 0 L 0 L    5 Minute Oxygen Saturation % 95 % 97 %    5 Minute Liters of Oxygen 0 L 0 L    6 Minute Oxygen Saturation % 96 % 97 %    6 Minute Liters of Oxygen 0 L 0 L    2 Minute Post Oxygen Saturation % 95 % 96 %    2 Minute Post Liters of Oxygen 0 L 0 L            Oxygen Initial Assessment:  Oxygen Initial Assessment - 07/24/22 1655       Home Oxygen   Home Oxygen Device None    Sleep Oxygen Prescription CPAP    Home Exercise Oxygen Prescription None    Home Resting Oxygen Prescription None    Compliance with Home Oxygen Use Yes      Initial 6 min Walk   Oxygen Used None      Program Oxygen Prescription   Program Oxygen Prescription None      Intervention   Short Term Goals To learn and exhibit compliance with exercise, home and travel O2 prescription;To learn and understand importance of maintaining oxygen saturations>88%;To learn and demonstrate proper use of respiratory medications;To learn and demonstrate proper pursed lip breathing techniques or other breathing techniques. ;To learn and understand importance of monitoring SPO2 with pulse oximeter and demonstrate accurate use of the pulse oximeter.    Long  Term Goals Exhibits compliance with  exercise, home  and travel O2 prescription;Verbalizes importance of monitoring SPO2 with pulse oximeter and return demonstration;Exhibits proper breathing techniques, such as pursed lip breathing or other method taught during program session;Maintenance of O2 saturations>88%;Compliance with respiratory medication;Demonstrates proper use of MDI's             Oxygen Re-Evaluation:  Oxygen Re-Evaluation     Row Name 07/28/22 1036 08/09/22 1151 08/16/22 1146 09/13/22 1130       Program Oxygen Prescription   Program Oxygen Prescription None None None None  Home Oxygen   Home Oxygen Device None None None None    Sleep Oxygen Prescription None None CPAP;None CPAP;None    Home Exercise Oxygen Prescription None None None None    Home Resting Oxygen Prescription None None None None    Compliance with Home Oxygen Use -- -- Yes Yes      Goals/Expected Outcomes   Short Term Goals To learn and demonstrate proper pursed lip breathing techniques or other breathing techniques.  To learn and demonstrate proper pursed lip breathing techniques or other breathing techniques.  To learn and demonstrate proper pursed lip breathing techniques or other breathing techniques. ;To learn and understand importance of monitoring SPO2 with pulse oximeter and demonstrate accurate use of the pulse oximeter.;To learn and understand importance of maintaining oxygen saturations>88%;To learn and demonstrate proper use of respiratory medications To learn and demonstrate proper pursed lip breathing techniques or other breathing techniques. ;To learn and understand importance of monitoring SPO2 with pulse oximeter and demonstrate accurate use of the pulse oximeter.;To learn and understand importance of maintaining oxygen saturations>88%;To learn and demonstrate proper use of respiratory medications    Long  Term Goals Exhibits proper breathing techniques, such as pursed lip breathing or other method taught during program  session Exhibits proper breathing techniques, such as pursed lip breathing or other method taught during program session Exhibits proper breathing techniques, such as pursed lip breathing or other method taught during program session;Maintenance of O2 saturations>88%;Verbalizes importance of monitoring SPO2 with pulse oximeter and return demonstration;Compliance with respiratory medication;Demonstrates proper use of MDI's Exhibits proper breathing techniques, such as pursed lip breathing or other method taught during program session;Maintenance of O2 saturations>88%;Verbalizes importance of monitoring SPO2 with pulse oximeter and return demonstration;Compliance with respiratory medication;Demonstrates proper use of MDI's    Comments Reviewed PLB technique with pt.  Talked about how it works and it's importance in maintaining their exercise saturations. Diaphragmatic and PLB breathing explained and performed with patient. Patient has a better understanding of how to do these exercises to help with breathing performance and relaxation. Patient performed breathing techniques adequately and to practice further at home. Kaitlin Kelley is doing well in rehab. She is doing better with her breathing. She tries to ues her PLB.  She is compliant with her CPAP each night.  She does check her saturations at home and has been doing well. She is also good about using her inhaler each day. Kaitlin Kelley is doing well in rehab. She states that she feels she is doing better with her breathing overall. We reviewed PLB and she reports that she has been practicing. She has been using her CPAP most nights as well. She has not been checking her saturations at home, but we encouraged her to do so especially with beginning to walk at home.    Goals/Expected Outcomes Short: Become more profiecient at using PLB. Long: Become independent at using PLB. Short: practice PLB and diaphragmatic breathing at home. Long: Use PLB and diaphragmatic breathing  independently post [employer]. Short: Continue to practice PLB to get better Long: continued compliance with CPAP Short: Continue to practice PLB and begin to check saturations at home. Long: Continued compliance with CPAP.             Oxygen Discharge (Final Oxygen Re-Evaluation):  Oxygen Re-Evaluation - 09/13/22 1130       Program Oxygen Prescription   Program Oxygen Prescription None      Home Oxygen   Home Oxygen Device None    Sleep Oxygen  Prescription CPAP;None    Home Exercise Oxygen Prescription None    Home Resting Oxygen Prescription None    Compliance with Home Oxygen Use Yes      Goals/Expected Outcomes   Short Term Goals To learn and demonstrate proper pursed lip breathing techniques or other breathing techniques. ;To learn and understand importance of monitoring SPO2 with pulse oximeter and demonstrate accurate use of the pulse oximeter.;To learn and understand importance of maintaining oxygen saturations>88%;To learn and demonstrate proper use of respiratory medications    Long  Term Goals Exhibits proper breathing techniques, such as pursed lip breathing or other method taught during program session;Maintenance of O2 saturations>88%;Verbalizes importance of monitoring SPO2 with pulse oximeter and return demonstration;Compliance with respiratory medication;Demonstrates proper use of MDI's    Comments Kaitlin Kelley is doing well in rehab. She states that she feels she is doing better with her breathing overall. We reviewed PLB and she reports that she has been practicing. She has been using her CPAP most nights as well. She has not been checking her saturations at home, but we encouraged her to do so especially with beginning to walk at home.    Goals/Expected Outcomes Short: Continue to practice PLB and begin to check saturations at home. Long: Continued compliance with CPAP.             Initial Exercise Prescription:  Initial Exercise Prescription - 07/24/22 1600        Date of Initial Exercise RX and Referring Provider   Date 07/24/22    Referring Provider Aleskerov      Oxygen   Maintain Oxygen Saturation 88% or higher      Treadmill   MPH 1.5    Grade 1    Minutes 15    METs 2.35      NuStep   Level 2    SPM 80    Minutes 15    METs 2.15      REL-XR   Level 2    Speed 50    Minutes 15    METs 2.15      Biostep-RELP   Level 2    SPM 50    Minutes 15    METs 2.15      Intensity   THRR 40-80% of Max Heartrate 102-132    Ratings of Perceived Exertion 11-13    Perceived Dyspnea 0-4      Progression   Progression Continue to progress workloads to maintain intensity without signs/symptoms of physical distress.      Resistance Training   Training Prescription Yes    Weight 2    Reps 10-15             Perform Capillary Blood Glucose checks as needed.  Exercise Prescription Changes:   Exercise Prescription Changes     Row Name 07/24/22 1600 07/31/22 1000 08/14/22 1300 08/16/22 1100 08/28/22 0900     Response to Exercise   Blood Pressure (Admit) 132/82 140/60 110/62 -- 124/82   Blood Pressure (Exercise) 140/80 132/62 144/68 -- 166/72   Blood Pressure (Exit) 128/70 124/72 102/62 -- 120/72   Heart Rate (Admit) 73 bpm 83 bpm 92 bpm -- 81 bpm   Heart Rate (Exercise) 98 bpm 93 bpm 109 bpm -- 133 bpm   Heart Rate (Exit) 69 bpm 85 bpm 75 bpm -- 92 bpm   Oxygen Saturation (Admit) 96 % 95 % 96 % -- 98 %   Oxygen Saturation (Exercise) 93 % 96 % 94 % --  95 %   Oxygen Saturation (Exit) 95 % 95 % 96 % -- 97 %   Rating of Perceived Exertion (Exercise) 13 9 16  -- 13   Perceived Dyspnea (Exercise) 1 -- -- -- 2   Symptoms 3/10 left hip pain none none -- SOB   Comments 6 MWT results 1st full day of exercise -- -- --   Duration -- Progress to 30 minutes of  aerobic without signs/symptoms of physical distress Continue with 30 min of aerobic exercise without signs/symptoms of physical distress. -- Progress to 30 minutes of  aerobic without  signs/symptoms of physical distress   Intensity -- THRR unchanged THRR unchanged -- THRR unchanged     Progression   Progression -- Continue to progress workloads to maintain intensity without signs/symptoms of physical distress. Continue to progress workloads to maintain intensity without signs/symptoms of physical distress. -- Continue to progress workloads to maintain intensity without signs/symptoms of physical distress.   Average METs -- 2.5 2.34 -- 2.9     Resistance Training   Training Prescription -- Yes Yes -- Yes   Weight -- 2 lb 2 lb -- 2 lb   Reps -- 10-15 10-15 -- 10-15     Interval Training   Interval Training -- No No -- No     Treadmill   MPH -- -- 2.3 -- 2.5   Grade -- -- 0 -- 0.5   Minutes -- -- 15 -- 15   METs -- -- -- -- 3.09     Recumbant Bike   Level -- -- -- -- 5   Watts -- -- -- -- 37   Minutes -- -- -- -- 15   METs -- -- -- -- 3.15     NuStep   Level -- 2 2 -- 5   Minutes -- 15 15 -- 15   METs -- -- 2.8 -- 4     REL-XR   Level -- -- 2 -- 6   Minutes -- -- 15 -- 15   METs -- -- 2 -- --     Biostep-RELP   Level -- 2 -- -- --   Minutes -- 15 -- -- --   METs -- 3 -- -- --     Home Exercise Plan   Plans to continue exercise at -- -- -- Home (comment)  walking, staff videos Home (comment)  walking, staff videos   Frequency -- -- -- Add 2 additional days to program exercise sessions. Add 2 additional days to program exercise sessions.   Initial Home Exercises Provided -- -- -- 08/16/22 08/16/22     Oxygen   Maintain Oxygen Saturation -- 88% or higher 88% or higher -- 88% or higher    Row Name 09/11/22 1300 09/25/22 0900 10/09/22 1300 10/23/22 1100       Response to Exercise   Blood Pressure (Admit) 126/62 114/64 118/68 124/62    Blood Pressure (Exit) 122/62 124/62 128/70 108/60    Heart Rate (Admit) 64 bpm 85 bpm 75 bpm 78 bpm    Heart Rate (Exercise) 124 bpm 103 bpm 113 bpm 120 bpm    Heart Rate (Exit) 96 bpm 83 bpm 91 bpm 99 bpm    Oxygen  Saturation (Admit) 95 % 97 % 95 % 97 %    Oxygen Saturation (Exercise) 93 % 94 % 93 % 88 %    Oxygen Saturation (Exit) 98 % 96 % 96 % 98 %    Rating of Perceived Exertion (Exercise) 12  13 13 13     Perceived Dyspnea (Exercise) 2 -- 2 1    Symptoms SOB none none SOB    Duration Progress to 30 minutes of  aerobic without signs/symptoms of physical distress Continue with 30 min of aerobic exercise without signs/symptoms of physical distress. Continue with 30 min of aerobic exercise without signs/symptoms of physical distress. Continue with 30 min of aerobic exercise without signs/symptoms of physical distress.    Intensity THRR unchanged THRR unchanged THRR unchanged THRR unchanged      Progression   Progression Continue to progress workloads to maintain intensity without signs/symptoms of physical distress. Continue to progress workloads to maintain intensity without signs/symptoms of physical distress. Continue to progress workloads to maintain intensity without signs/symptoms of physical distress. Continue to progress workloads to maintain intensity without signs/symptoms of physical distress.    Average METs 3.21 3.84 3.68 2.84      Resistance Training   Training Prescription Yes Yes Yes Yes    Weight 2 lb 2 lb 2 lb 2 lb    Reps 10-15 10-15 10-15 10-15      Interval Training   Interval Training No No No No      Treadmill   MPH 2 2 2 2     Grade 2 2 2  0    Minutes 15 15 11 15     METs 3.08 3.08 3.08 2.53      NuStep   Level 6 6 6 6     Minutes 15 15 15 15     METs 4.1 3.7 3.8 3.2      Elliptical   Level -- -- 1 --    Speed -- -- 1 --    Minutes -- -- 3 --      REL-XR   Level 5 6 7 5     Minutes 15 15 15 15     METs 2.9 6.1 3 --      Home Exercise Plan   Plans to continue exercise at Home (comment)  walking, staff videos Home (comment)  walking, staff videos Home (comment)  walking, staff videos Home (comment)  walking, staff videos    Frequency Add 2 additional days to program  exercise sessions. Add 2 additional days to program exercise sessions. Add 2 additional days to program exercise sessions. Add 2 additional days to program exercise sessions.    Initial Home Exercises Provided 08/16/22 08/16/22 08/16/22 08/16/22      Oxygen   Maintain Oxygen Saturation 88% or higher 88% or higher 88% or higher 88% or higher             Exercise Comments:   Exercise Comments     Row Name 07/28/22 1035           Exercise Comments First full day of exercise!  Patient was oriented to gym and equipment including functions, settings, policies, and procedures.  Patient's individual exercise prescription and treatment plan were reviewed.  All starting workloads were established based on the results of the 6 minute walk test done at initial orientation visit.  The plan for exercise progression was also introduced and progression will be customized based on patient's performance and goals.                Exercise Goals and Review:   Exercise Goals     Row Name 07/24/22 1647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 education Encouraged to attend [facility] Rehabilitation for  the exercise;Stress management education Encouraged to attend Pulmonary Rehabilitation for the exercise     Continue Psychosocial Services  Follow up required by staff Follow up required by staff Follow up required by staff       Initial Review   Source of Stress Concerns Unable to participate in former interests or hobbies;Unable to perform yard/household activities;Family Unable to participate in former interests or hobbies;Unable to perform yard/household activities;Family --              Psychosocial Discharge (Final Psychosocial Re-Evaluation):  Psychosocial Re-Evaluation - 10/13/22 1107       Psychosocial Re-Evaluation   Current issues with Current Depression;Current Anxiety/Panic;Current Stress Concerns;History of Depression    Comments Reviewed patient health questionnaire (PHQ-9) with patient for follow up. Previously, patients score indicated signs/symptoms of depression.  Reviewed to see if patient is improving symptom wise while in program.  Score improved declined and patient states that it is because she is aches alot. She has headaches and body aches often.    Expected Outcomes Short: Continue to attend [facility] regularly for regular exercise and social engagement. Long: Continue to improve symptoms and manage a positive mental state.    Interventions Encouraged to attend [facility] for the exercise    Continue Psychosocial Services  Follow up required by staff             Education: Education Goals: Education classes will be provided on a weekly basis, covering required topics. Participant will state understanding/return demonstration of topics presented.  Learning Barriers/Preferences:  Learning Barriers/Preferences - 07/24/22 1030       Learning Barriers/Preferences   Learning Barriers None    Learning Preferences None             General Pulmonary Education Topics:  Infection Prevention: - Provides verbal and written material to  individual with discussion of infection control including proper hand washing and proper equipment cleaning during exercise session. Flowsheet Row Pulmonary Rehab from 09/20/2022 in [facility] Cardiac and Pulmonary Rehab  Date 07/24/22  Educator [facility]  Instruction Review Code 1- Verbalizes Understanding       Falls Prevention: - Provides verbal and written material to individual with discussion of falls prevention and safety. Flowsheet Row Pulmonary Rehab from 09/20/2022 in [facility] Cardiac and Pulmonary Rehab  Date 07/24/22  Educator [facility]  Instruction Review Code 1- Verbalizes Understanding       Chronic Lung Disease Review: - Group verbal instruction with posters, models, PowerPoint presentations and videos,  to review new updates, new respiratory medications, new advancements in procedures and treatments. Providing information on websites and "800" numbers for continued self-education. Includes information about supplement oxygen, available portable oxygen systems, continuous and intermittent flow rates, oxygen safety, concentrators, and Medicare reimbursement for oxygen. Explanation of Pulmonary Drugs, including class, frequency, complications, importance of spacers, rinsing mouth after steroid MDI's, and proper cleaning methods for nebulizers. Review of basic lung anatomy and  physiology related to function, structure, and complications of lung disease. Review of risk factors. Discussion about methods for diagnosing sleep apnea and types of masks and machines for OSA. Includes a review of the use of types of environmental controls: home humidity, furnaces, filters, dust mite/pet prevention, HEPA vacuums. Discussion about weather changes, air quality and the benefits of nasal washing. Instruction on Warning signs, infection symptoms, calling MD promptly, preventive modes, and value of vaccinations. Review of effective airway clearance, coughing and/or vibration techniques. Emphasizing that all should Create an  Action Plan. Written material given at graduation. Flowsheet Row Pulmonary Rehab from 09/20/2022 in [facility] Cardiac and Pulmonary Rehab  Education need identified 07/24/22  Date 09/13/22  Educator [facility]  Instruction Review Code 1- Verbalizes Understanding       AED/CPR: - Group verbal and written instruction with the use of models to demonstrate the basic use of the AED with the basic ABC's of resuscitation.    Anatomy and Cardiac Procedures: - Group verbal and visual presentation and models provide information about basic cardiac anatomy and function. Reviews the testing methods done to diagnose heart disease and the outcomes of the test results. Describes the treatment choices: Medical Management, Angioplasty, or Coronary Bypass Surgery for treating various heart conditions including Myocardial Infarction, Angina, Valve Disease, and Cardiac Arrhythmias.  Written material given at graduation.   Medication Safety: - Group verbal and visual instruction to review commonly prescribed medications for heart and lung disease. Reviews the medication, class of the drug, and side effects. Includes the steps to properly store meds and maintain the prescription regimen.  Written material given at graduation.   Other: -Provides group and verbal instruction on various topics (see comments)   Knowledge Questionnaire Score:  Knowledge Questionnaire Score - 10/20/22 1121       Knowledge Questionnaire Score   Pre Score 14/18    Post Score 17/18              Core Components/Risk Factors/Patient Goals at Admission:  Personal Goals and Risk Factors at Admission - 07/24/22 1650       Core Components/Risk Factors/Patient Goals on Admission    Weight Management Yes;Weight Loss    Intervention Weight Management: Develop a combined nutrition and exercise program designed to reach desired caloric intake, while maintaining appropriate intake of nutrient and fiber, sodium and fats, and appropriate energy  expenditure required for the weight goal.;Weight Management: Provide education and appropriate resources to help participant work on and attain dietary goals.;Obesity: Provide education and appropriate resources to help participant work on and attain dietary goals.;Weight Management/Obesity: Establish reasonable short term and long term weight goals.    Admit Weight 220 lb 14.4 oz (100.2 kg)    Goal Weight: Short Term 210 lb (95.3 kg)    Goal Weight: Long Term 200 lb (90.7 kg)    Expected Outcomes Short Term: Continue to assess and modify interventions until short term weight is achieved;Long Term: Adherence to nutrition and physical activity/exercise program aimed toward attainment of established weight goal;Weight Loss: Understanding of general recommendations for a balanced deficit meal plan, which promotes 1-2 lb weight loss per week and includes a negative energy balance of [phone] kcal/d;Understanding recommendations for meals to include 15-35% energy as protein, 25-35% energy from fat, 35-60% energy from carbohydrates, less than 200mg  of dietary cholesterol, 20-35 gm of total fiber daily;Understanding of distribution of calorie intake throughout the day with the consumption of 4-5 meals/snacks    Improve shortness of breath with ADL's Yes  Intervention Provide education, individualized exercise plan and daily activity instruction to help decrease symptoms of SOB with activities of daily living.    Expected Outcomes Short Term: Improve cardiorespiratory fitness to achieve a reduction of symptoms when performing ADLs;Long Term: Be able to perform more ADLs without symptoms or delay the onset of symptoms    Diabetes Yes    Intervention Provide education about signs/symptoms and action to take for hypo/hyperglycemia.;Provide education about proper nutrition, including hydration, and aerobic/resistive exercise prescription along with prescribed medications to achieve blood glucose in normal ranges:  Fasting glucose 65-99 mg/dL    Expected Outcomes Short Term: Participant verbalizes understanding of the signs/symptoms and immediate care of hyper/hypoglycemia, proper foot care and importance of medication, aerobic/resistive exercise and nutrition plan for blood glucose control.;Long Term: Attainment of HbA1C < 7%.    Hypertension Yes    Intervention Provide education on lifestyle modifcations including regular physical activity/exercise, weight management, moderate sodium restriction and increased consumption of fresh fruit, vegetables, and low fat dairy, alcohol moderation, and smoking cessation.;Monitor prescription use compliance.    Expected Outcomes Short Term: Continued assessment and intervention until BP is < 140/27mm HG in hypertensive participants. < 130/34mm HG in hypertensive participants with diabetes, heart failure or chronic kidney disease.;Long Term: Maintenance of blood pressure at goal levels.    Lipids Yes    Intervention Provide education and support for participant on nutrition & aerobic/resistive exercise along with prescribed medications to achieve LDL 70mg , HDL >40mg .    Expected Outcomes Short Term: Participant states understanding of desired cholesterol values and is compliant with medications prescribed. Participant is following exercise prescription and nutrition guidelines.;Long Term: Cholesterol controlled with medications as prescribed, with individualized exercise RX and with personalized nutrition plan. Value goals: LDL < , HDL > 40 mg.             Education:Diabetes - Individual verbal and written instruction to review signs/symptoms of diabetes, desired ranges of glucose level fasting, after meals and with exercise. Acknowledge that pre and post exercise glucose checks will be done for 3 sessions at entry of program. Flowsheet Row Pulmonary Rehab from 09/20/2022 in [facility] Cardiac and Pulmonary Rehab  Date 07/24/22  Educator [facility]  Instruction Review Code 1-  Verbalizes Understanding       Know Your Numbers and Heart Failure: - Group verbal and visual instruction to discuss disease risk factors for cardiac and pulmonary disease and treatment options.  Reviews associated critical values for Overweight/Obesity, Hypertension, Cholesterol, and Diabetes.  Discusses basics of heart failure: signs/symptoms and treatments.  Introduces Heart Failure Zone chart for action plan for heart failure.  Written material given at graduation.   Core Components/Risk Factors/Patient Goals Review:   Goals and Risk Factor Review     Row Name 08/16/22 1142 09/13/22 1122           Core Components/Risk Factors/Patient Goals Review   Personal Goals Review Weight Management/Obesity;Improve shortness of breath with ADL's;Diabetes;Increase knowledge of respiratory medications and ability to use respiratory devices properly.;Hypertension Weight Management/Obesity;Improve shortness of breath with ADL's;Diabetes;Increase knowledge of respiratory medications and ability to use respiratory devices properly.;Hypertension      Review Kaitlin Kelley is doing well in rehab.  Her weight has been yoyoing. She will go down 4 then up 5 then down 2 and up 3.  She is trying to find a balance and maintain the downward trend.  Her breathing id doing well and she is using her inhaler like she is supposed to.  Her sugars  have been running higher than normal.  She really wants to work to get better control over her sugars and will set up an appointment with dietitan to help.  Her pressures are doing well and she does check them at home. Kaitlin Kelley is still working on losing some weight. She weighed in to day at 228.3 lb but would like to get down to 200 lb. She also reports that she is checking her blood sugars at home and states that they are progressing and she is gaining control over them. She also reports that her breathing has improved and she has been practicing PLB technique. She has not been checking her BP  at home but states that she does own a BP cuff but has not been using it due to her readings being good while in rehab.      Expected Outcomes short: Continue to work on better blood sugar control to help with weight loss Long; continue to monitor risk factors. Short: Continue to work on improving blood sugars. Long: Continue to manage lifestyle risk factors.               Core Components/Risk Factors/Patient Goals at Discharge (Final Review):   Goals and Risk Factor Review - 09/13/22 1122       Core Components/Risk Factors/Patient Goals Review   Personal Goals Review Weight Management/Obesity;Improve shortness of breath with ADL's;Diabetes;Increase knowledge of respiratory medications and ability to use respiratory devices properly.;Hypertension    Review Kaitlin Kelley is still working on losing some weight. She weighed in to day at 228.3 lb but would like to get down to 200 lb. She also reports that she is checking her blood sugars at home and states that they are progressing and she is gaining control over them. She also reports that her breathing has improved and she has been practicing PLB technique. She has not been checking her BP at home but states that she does own a BP cuff but has not been using it due to her readings being good while in rehab.    Expected Outcomes Short: Continue to work on improving blood sugars. Long: Continue to manage lifestyle risk factors.             ITP Comments:  ITP Comments     Row Name 07/24/22 1028 07/24/22 1640 07/28/22 1035 08/02/22 0854 08/17/22 0921   ITP Comments Virtual Visit completed. Patient informed on EP and RD appointment and 6 Minute walk test. Patient also informed of patient health questionnaires on My Chart. Patient Verbalizes understanding. Visit diagnosis can be found in [facility] 07/12/22. Completed and gym orientation. Initial ITP created and sent for review to Dr. Jinny Sanders, Medical Director. First full day of exercise!  Patient was  oriented to gym and equipment including functions, settings, policies, and procedures.  Patient's individual exercise prescription and treatment plan were reviewed.  All starting workloads were established based on the results of the 6 minute walk test done at initial orientation visit.  The plan for exercise progression was also introduced and progression will be customized based on patient's performance and goals. 30 Day review completed. Medical Director ITP review done, changes made as directed, and signed approval by Medical Director.   new to program Completed initial RD consultation    Row Name 08/30/22 0749 09/27/22 0746 10/25/22 0742 10/30/22 1123     ITP Comments 30 day review completed. ITP sent to Dr. Jinny Sanders, Medical Director of  Pulmonary Rehab. Continue with ITP unless  changes are made by physician. 30 Day review completed. Medical Director ITP review done, changes made as directed, and signed approval by Medical Director. 30 day review completed. ITP sent to Dr. Jinny Sanders, Medical Director of  Pulmonary Rehab. Continue with ITP unless changes are made by physician. Kaitlin Kelley graduated today from  rehab with 36 sessions completed.  Details of the patient's exercise prescription and what She needs to do in order to continue the prescription and progress were discussed with patient.  Patient was given a copy of prescription and goals.  Patient verbalized understanding.  Evin plans to continue to exercise by walking and staff videos.             Comments: Discharge ITP

## 2022-10-30 NOTE — Progress Notes (Signed)
Discharge Summary: Kaitlin Kelley (DOB: [birth year])   Kaitlin Kelley graduated today from  rehab with 36 sessions completed.  Details of the patient's exercise prescription and what She needs to do in order to continue the prescription and progress were discussed with patient.  Patient was given a copy of prescription and goals.  Patient verbalized understanding.  Kaitlin Kelley plans to continue to exercise by walking and staff videos.   6 Minute Walk     Row Name 07/24/22 1640 10/18/22 1120       6 Minute Walk   Phase Initial Initial    Distance 1215 feet 1345 feet    Distance % Change -- 10.7 %    Distance Feet Change -- 130 ft    Walk Time 6 minutes 6 minutes    # of Rest Breaks 0 0    MPH 2.3 2.55    METS 2.15 2.89    RPE 13 13    Perceived Dyspnea  1 1    VO2 Peak 7.54 10.11    Symptoms No Yes (comment)    Comments -- left hip pain 6/10    Resting HR 73 bpm 97 bpm    Resting BP 132/82 124/70    Resting Oxygen Saturation  96 % 97 %    Exercise Oxygen Saturation  during 6 min walk 93 % 96 %    Max Ex. HR 98 bpm 142 bpm    Max Ex. BP 140/80 148/76    2 Minute Post BP 128/70 132/66      Interval HR   1 Minute HR 81 113    2 Minute HR 95 124    3 Minute HR 98 130    4 Minute HR 97 133    5 Minute HR 97 140    6 Minute HR 95 142    2 Minute Post HR 69 96    Interval Heart Rate? Yes Yes      Interval Oxygen   Interval Oxygen? Yes Yes    Baseline Oxygen Saturation % 96 % 97 %    1 Minute Oxygen Saturation % 95 % 97 %    1 Minute Liters of Oxygen 0 L 0 L    2 Minute Oxygen Saturation % 93 % 97 %    2 Minute Liters of Oxygen 0 L 0 L    3 Minute Oxygen Saturation % 95 % 97 %    3 Minute Liters of Oxygen 0 L 0 L    4 Minute Oxygen Saturation % 96 % 98 %    4 Minute Liters of Oxygen 0 L 0 L    5 Minute Oxygen Saturation % 95 % 97 %    5 Minute Liters of Oxygen 0 L 0 L    6 Minute Oxygen Saturation % 96 % 97 %    6 Minute Liters of Oxygen 0 L 0 L    2 Minute Post Oxygen Saturation % 95  % 96 %    2 Minute Post Liters of Oxygen 0 L 0 L

## 2022-11-08 ENCOUNTER — Emergency Department (HOSPITAL_COMMUNITY)
Admission: EM | Admit: 2022-11-08 | Discharge: 2022-11-08 | Disposition: A | Discharge status: Home / Self Care | Payer: Medicare Other | Attending: Emergency Medicine | Admitting: Emergency Medicine

## 2022-11-08 ENCOUNTER — Other Ambulatory Visit: Payer: Self-pay

## 2022-11-08 ENCOUNTER — Encounter (HOSPITAL_COMMUNITY): Payer: Self-pay | Admitting: *Deleted

## 2022-11-08 ENCOUNTER — Emergency Department (HOSPITAL_COMMUNITY): Payer: Medicare Other

## 2022-11-08 DIAGNOSIS — Z794 Long term (current) use of insulin: Secondary | ICD-10-CM | POA: Insufficient documentation

## 2022-11-08 DIAGNOSIS — R0789 Other chest pain: Secondary | ICD-10-CM | POA: Insufficient documentation

## 2022-11-08 DIAGNOSIS — R55 Syncope and collapse: Secondary | ICD-10-CM

## 2022-11-08 LAB — CBC
HCT: 39.9 % (ref 36.0–46.0)
Hemoglobin: 13.3 g/dL (ref 12.0–15.0)
MCH: 35 pg — ABNORMAL HIGH (ref 26.0–34.0)
MCHC: 33.3 g/dL (ref 30.0–36.0)
MCV: 105 fL — ABNORMAL HIGH (ref 80.0–100.0)
Platelets: 283 10*3/uL (ref 150–400)
RBC: 3.8 MIL/uL — ABNORMAL LOW (ref 3.87–5.11)
RDW: 12.5 % (ref 11.5–15.5)
WBC: 6 10*3/uL (ref 4.0–10.5)
nRBC: 0 % (ref 0.0–0.2)

## 2022-11-08 LAB — BASIC METABOLIC PANEL
Anion gap: 11 (ref 5–15)
BUN: 20 mg/dL (ref 8–23)
CO2: 22 mmol/L (ref 22–32)
Calcium: 9.2 mg/dL (ref 8.9–10.3)
Chloride: 100 mmol/L (ref 98–111)
Creatinine, Ser: 1.07 mg/dL — ABNORMAL HIGH (ref 0.44–1.00)
GFR, Estimated: 55 mL/min — ABNORMAL LOW (ref >60)
Glucose, Bld: 277 mg/dL — ABNORMAL HIGH (ref 70–99)
Potassium: 5.4 mmol/L — ABNORMAL HIGH (ref 3.5–5.1)
Sodium: 133 mmol/L — ABNORMAL LOW (ref 135–145)

## 2022-11-08 LAB — TROPONIN I (HIGH SENSITIVITY): Troponin I (High Sensitivity): 11 ng/L (ref <18)

## 2022-11-08 NOTE — ED Provider Notes (Signed)
[facility] EMERGENCY DEPARTMENT AT [facility] Provider Note   CSN: [id number] Arrival date & time: 11/08/22  1739     History  Chief Complaint  Patient presents with   Near Syncope    Kaitlin Kelley is a 72 y.o. female.  72 yo F with a cc of a syncopal event.  The patient was running some errands and she felt a sudden stabbing pain to the right side of her chest underneath her breast.  She had pains like this off and on bilaterally.  They usually seem to come and go.  Typically she will take some Tylenol and lay down and rest and then it resolves.  This time however she felt unwell with it she felt she got sweaty she told her son that she did not feel well and then reportedly she passed out.  She was out for short period of time and then was back to normal.  Her chest pain had completely resolved.  She denied any abdominal pain nausea or vomiting.  She went home and had something to eat and drink since she had not had anything to eat or drink all day.  She had a couple of friends that live nearby come and evaluate her and they felt like her left side of her face was slightly more drooped than the right side and they felt like she had some left upper extremity weakness.  She denies any obvious weakness.  She has some left headaches from time to time which has been an ongoing issue that she sees neurology for.  She has had a new eyedrop for her glaucoma.  Has had no issues taking that medication.  Otherwise denies any medications.   Near Syncope       Home Medications Prior to Admission medications   Medication Sig Start Date End Date Taking? Authorizing Provider  amLODipine (NORVASC) 5 MG tablet Take 5 mg by mouth at bedtime.  06/10/19   [provider]  azelastine (ASTELIN) 0.1 % nasal spray Place 1 spray into both nostrils as needed for allergies. 11/19/19   [provider]  Bacillus Coagulans-Inulin (ALIGN PREBIOTIC-PROBIOTIC PO) Take 1 capsule by mouth  daily.    [provider]  calcium-vitamin D (OSCAL WITH D) 500-5 MG-MCG tablet Take 1 tablet by mouth. 1000mg     [provider]  Cholecalciferol 50 MCG (2000 UT) CAPS Take 2,000 Units by mouth daily.     [provider]  Collagen-Vitamin C-Biotin (COLLAGEN 1500/C) 500-50-0.8 MG CAPS daily.    [provider]  cyanocobalamin 100 MCG tablet Take by mouth.    [provider]  Cyanocobalamin 3000 MCG CAPS Take 3,000 mcg by mouth daily.  04/02/18   [provider]  dicyclomine (BENTYL) 10 MG capsule Take 10 mg by mouth 4 (four) times daily as needed for spasms.  09/12/19   [provider]  dorzolamide (TRUSOPT) 2 % ophthalmic solution Place 1 drop into both eyes in the morning and at bedtime.  12/25/18   [provider]  Dulaglutide (TRULICITY) 1.5 MG/0.5ML SOPN Inject 0.5 mLs into the skin once a week. Patient not taking: Reported on 02/19/2020 12/03/19   [provider]  DULoxetine (CYMBALTA) 60 MG capsule Take 60 mg by mouth 2 (two) times daily.     [provider]  ferrous gluconate (FERGON) 240 (27 FE) MG tablet Take by mouth.    [provider]  folic acid (FOLVITE) 1 MG tablet Take 1 mg by  mouth daily.    [provider]  gabapentin (NEURONTIN) 300 MG capsule Take 300 mg by mouth 3 (three) times daily.    [provider]  glipiZIDE (GLUCOTROL) 10 MG tablet Take 10 mg by mouth in the morning and at bedtime.  11/24/19   [provider]  insulin detemir (LEVEMIR) 100 UNIT/ML injection Inject 30 Units into the skin at bedtime.     [provider]  Insulin Glargine-Lixisenatide (SOLIQUA) 100-33 UNT-MCG/ML SOPN Inject 30 Units into the skin at bedtime.    [provider]  Lactobacillus 0.05-0.05 MG TABS Take by mouth.    [provider]  Lancets 28G MISC use to check blood sugar three to four times a day or as directed Patient not taking: Reported on  07/24/2022 05/15/14   [provider]  levothyroxine (SYNTHROID) 75 MCG tablet Take 1 tablet by mouth daily. Patient not taking: Reported on 07/24/2022 12/29/21   [provider]  levothyroxine (SYNTHROID, LEVOTHROID) 75 MCG tablet Take 75 mcg by mouth See admin instructions. Take 75 mcg by mouth daily before breakfast Monday through Saturday Patient not taking: Reported on 07/24/2022    [provider]  losartan (COZAAR) 50 MG tablet Take 50 mg by mouth daily.    [provider]  methotrexate (RHEUMATREX) 2.5 MG tablet TAKE 3 TABLETS (7.5 MG TOTAL) BY MOUTH EVERY 7 (SEVEN) DAYS 05/23/20   [provider]  mirtazapine (REMERON) 15 MG tablet Take 15 mg by mouth at bedtime. 01/07/20   [provider]  modafinil (PROVIGIL) 100 MG tablet Take by mouth. 07/17/22 10/15/22  [provider]  Multiple Vitamin tablet Take 1 tablet by mouth daily.    [provider]  Multiple Vitamins-Minerals (AIRBORNE PO) Take 2 tablets by mouth in the morning and at bedtime.    [provider]  rosuvastatin (CRESTOR) 40 MG tablet Take 40 mg by mouth at bedtime.  Patient not taking: Reported on 07/24/2022 05/14/19   [provider]  rosuvastatin (CRESTOR) 40 MG tablet Take 1 tablet by mouth daily. 02/22/22   [provider]  sulfaSALAzine (AZULFIDINE) 500 MG tablet Take 1,000 mg by mouth 2 (two) times daily.    [provider]  sulfaSALAzine (AZULFIDINE) 500 MG tablet Take 2 tablets by mouth 2 (two) times daily. Patient not taking: Reported on 07/24/2022 02/02/20   [provider]  TURMERIC PO Take 1 capsule by mouth daily.     [provider]      Allergies    Patient has no known allergies.    Review of Systems   Review of Systems  Cardiovascular:  Positive for near-syncope.    Physical Exam Updated Vital Signs BP (!) 147/81   Pulse 86   Temp 98 F (36.7 C) (Oral)   Resp (!) 23   Ht 5\' 6"  (1.676  m)   Wt 102 kg   SpO2 96%   BMI 36.29 kg/m  Physical Exam Vitals and nursing note reviewed.  Constitutional:      General: She is not in acute distress.    Appearance: She is well-developed. She is not diaphoretic.  HENT:     Head: Normocephalic and atraumatic.  Eyes:     Pupils: Pupils are equal, round, and reactive to light.  Cardiovascular:     Rate and Rhythm: Normal rate and regular rhythm.     Heart sounds: No murmur heard.    No friction rub. No gallop.  Pulmonary:  Effort: Pulmonary effort is normal.     Breath sounds: No wheezing or rales.  Abdominal:     General: There is no distension.     Palpations: Abdomen is soft.     Tenderness: There is no abdominal tenderness.  Musculoskeletal:        General: Tenderness present.     Cervical back: Normal range of motion and neck supple.     Comments: Patient has pinpoint chest wall tenderness about the lateral clavicular line about ribs 7 and 8 reproduce her chest discomfort.  Skin:    General: Skin is warm and dry.  Neurological:     Mental Status: She is alert and oriented to person, place, and time.  Psychiatric:        Behavior: Behavior normal.     ED Results / Procedures / Treatments   Labs (all labs ordered are listed, but only abnormal results are displayed) Labs Reviewed  BASIC METABOLIC PANEL - Abnormal; Notable for the following components:      Result Value   Sodium 133 (*)    Potassium 5.4 (*)    Glucose, Bld 277 (*)    Creatinine, Ser 1.07 (*)    GFR, Estimated 55 (*)    All other components within normal limits  CBC - Abnormal; Notable for the following components:   RBC 3.80 (*)    MCV 105.0 (*)    MCH 35.0 (*)    All other components within normal limits  TROPONIN I (HIGH SENSITIVITY)  TROPONIN I (HIGH SENSITIVITY)    EKG None  Radiology DG Chest 2 View  Result Date: 11/08/2022 CLINICAL DATA:  Syncope and right-sided chest pain EXAM: CHEST - 2 VIEW COMPARISON:  X-ray 11/07/2021 and  older FINDINGS: Eventration of the right hemidiaphragm. No consolidation, pneumothorax or effusion. No edema. Normal cardiopericardial silhouette. Calcified aorta. Chronic right rib deformity. Bridging syndesmophytes along the thoracic spine. IMPRESSION: No acute cardiopulmonary disease Electronically Signed   By: Karen Kays M.D.   On: 11/08/2022 18:35    Procedures Procedures    Medications Ordered in ED Medications - No data to display  ED Course/ Medical Decision Making/ A&P                             Medical Decision Making Amount and/or Complexity of Data Reviewed Labs: ordered. Radiology: ordered.   72 yo F with a chief complaints of a syncopal event.  Based on history it sounds vasovagal.  I think it was stimulated by her chest discomfort.  The chest discomfort is atypical and reproduced on exam.  She has a negative troponin.  Symptoms have resolved greater than 6 hours ago I do not feel a second troponin would be helpful.  I did offer to obtain a CT angiogram of the chest that she had chest pain and a syncopal event.  She is declining this.  She was more worried about a stroke.  Had a discussion with her about how it would be uncommon to have a stroke that is so large that make she syncopized and then have no further symptoms.  I did offer to obtain an MRI which she also is declining.  Will have her eat and drink as well as she can as she had not had anything to eat or drink prior to this event.  She also was recently started on timolol drops which I think could potentially have slowed her heart rate  down.  She is not bradycardic here.  Will have her follow-up with her family doctor in the office.  She has no electrolyte abnormality on her blood work, no anemia.  Troponin negative.  10:02 PM:  I have discussed the diagnosis/risks/treatment options with the patient.  Evaluation and diagnostic testing in the emergency department does not suggest an emergent condition requiring  admission or immediate intervention beyond what has been performed at this time.  They will follow up with PCP. We also discussed returning to the ED immediately if new or worsening sx occur. We discussed the sx which are most concerning (e.g., sudden worsening pain, fever, inability to tolerate by mouth) that necessitate immediate return. Medications administered to the patient during their visit and any new prescriptions provided to the patient are listed below.  Medications given during this visit Medications - No data to display   The patient appears reasonably screen and/or stabilized for discharge and I doubt any other medical condition or other [facility] requiring further screening, evaluation, or treatment in the ED at this time prior to discharge.          Final Clinical Impression(s) / ED Diagnoses Final diagnoses:  Syncope and collapse    Rx / DC Orders ED Discharge Orders     None         Melene Plan, DO 11/08/22 2202

## 2022-11-08 NOTE — ED Notes (Signed)
Pt has headaches at baseline from a pinched nerve in her neck. Denies any unilateral weakness at present

## 2022-11-08 NOTE — ED Triage Notes (Signed)
The pt also had some rt sided chest pain initially

## 2022-11-08 NOTE — ED Triage Notes (Signed)
The pt had a near syncopal episode around  1545 today  she said she felt weird and some nausea.    Her family member with her reports that she was weak and had difficulty getting to the car  alert speech ok no other  difficulty except that she did not have the same sensation in her lt shoulder than the rt   a and o x4  speech clear  the edp checked her out ruled out for stroke symptoms  she is a diabetic and she had some yogurt  but her blood sugar did not drop    it was checked

## 2022-11-08 NOTE — Discharge Instructions (Signed)
Eat and drink as well as for the next couple days.  Please follow-up with your family doctor in the office.  Please return for worsening chest pain worsening headache one-sided numbness or weakness difficulty speech or swallowing.

## 2022-12-05 ENCOUNTER — Other Ambulatory Visit: Payer: Self-pay

## 2022-12-05 ENCOUNTER — Inpatient Hospital Stay: Payer: Medicare Other

## 2022-12-05 ENCOUNTER — Inpatient Hospital Stay: Payer: Medicare Other | Admitting: Anesthesiology

## 2022-12-05 ENCOUNTER — Encounter
Admission: EM | Disposition: A | Discharge status: Home Health | Payer: Self-pay | Source: Home / Self Care | Attending: Osteopathic Medicine

## 2022-12-05 ENCOUNTER — Inpatient Hospital Stay
Admission: EM | Admit: 2022-12-05 | Discharge: 2022-12-11 | DRG: 481 | Disposition: A | Discharge status: Home Health | Payer: Medicare Other | Attending: Osteopathic Medicine | Admitting: Osteopathic Medicine

## 2022-12-05 ENCOUNTER — Emergency Department: Payer: Medicare Other

## 2022-12-05 DIAGNOSIS — Z806 Family history of leukemia: Secondary | ICD-10-CM

## 2022-12-05 DIAGNOSIS — Z7985 Long-term (current) use of injectable non-insulin antidiabetic drugs: Secondary | ICD-10-CM

## 2022-12-05 DIAGNOSIS — D62 Acute posthemorrhagic anemia: Secondary | ICD-10-CM | POA: Diagnosis not present

## 2022-12-05 DIAGNOSIS — Z803 Family history of malignant neoplasm of breast: Secondary | ICD-10-CM

## 2022-12-05 DIAGNOSIS — Z7984 Long term (current) use of oral hypoglycemic drugs: Secondary | ICD-10-CM

## 2022-12-05 DIAGNOSIS — D869 Sarcoidosis, unspecified: Secondary | ICD-10-CM | POA: Diagnosis present

## 2022-12-05 DIAGNOSIS — S72142A Displaced intertrochanteric fracture of left femur, initial encounter for closed fracture: Secondary | ICD-10-CM | POA: Diagnosis present

## 2022-12-05 DIAGNOSIS — E1122 Type 2 diabetes mellitus with diabetic chronic kidney disease: Secondary | ICD-10-CM | POA: Diagnosis present

## 2022-12-05 DIAGNOSIS — E1169 Type 2 diabetes mellitus with other specified complication: Secondary | ICD-10-CM

## 2022-12-05 DIAGNOSIS — R6889 Other general symptoms and signs: Secondary | ICD-10-CM | POA: Diagnosis present

## 2022-12-05 DIAGNOSIS — N183 Chronic kidney disease, stage 3 unspecified: Secondary | ICD-10-CM | POA: Diagnosis not present

## 2022-12-05 DIAGNOSIS — S72002A Fracture of unspecified part of neck of left femur, initial encounter for closed fracture: Secondary | ICD-10-CM

## 2022-12-05 DIAGNOSIS — N1831 Chronic kidney disease, stage 3a: Secondary | ICD-10-CM | POA: Diagnosis present

## 2022-12-05 DIAGNOSIS — F05 Delirium due to known physiological condition: Secondary | ICD-10-CM | POA: Diagnosis not present

## 2022-12-05 DIAGNOSIS — E11649 Type 2 diabetes mellitus with hypoglycemia without coma: Secondary | ICD-10-CM | POA: Diagnosis present

## 2022-12-05 DIAGNOSIS — Z8042 Family history of malignant neoplasm of prostate: Secondary | ICD-10-CM

## 2022-12-05 DIAGNOSIS — Z833 Family history of diabetes mellitus: Secondary | ICD-10-CM

## 2022-12-05 DIAGNOSIS — S72009A Fracture of unspecified part of neck of unspecified femur, initial encounter for closed fracture: Secondary | ICD-10-CM | POA: Diagnosis present

## 2022-12-05 DIAGNOSIS — Y92009 Unspecified place in unspecified non-institutional (private) residence as the place of occurrence of the external cause: Secondary | ICD-10-CM

## 2022-12-05 DIAGNOSIS — G4730 Sleep apnea, unspecified: Secondary | ICD-10-CM | POA: Diagnosis present

## 2022-12-05 DIAGNOSIS — I10 Essential (primary) hypertension: Secondary | ICD-10-CM | POA: Diagnosis not present

## 2022-12-05 DIAGNOSIS — Z794 Long term (current) use of insulin: Secondary | ICD-10-CM | POA: Diagnosis not present

## 2022-12-05 DIAGNOSIS — M069 Rheumatoid arthritis, unspecified: Secondary | ICD-10-CM | POA: Diagnosis present

## 2022-12-05 DIAGNOSIS — D7589 Other specified diseases of blood and blood-forming organs: Secondary | ICD-10-CM | POA: Diagnosis present

## 2022-12-05 DIAGNOSIS — W19XXXA Unspecified fall, initial encounter: Secondary | ICD-10-CM

## 2022-12-05 DIAGNOSIS — Z419 Encounter for procedure for purposes other than remedying health state, unspecified: Secondary | ICD-10-CM

## 2022-12-05 DIAGNOSIS — E861 Hypovolemia: Secondary | ICD-10-CM | POA: Diagnosis present

## 2022-12-05 DIAGNOSIS — I951 Orthostatic hypotension: Secondary | ICD-10-CM | POA: Diagnosis present

## 2022-12-05 DIAGNOSIS — N179 Acute kidney failure, unspecified: Secondary | ICD-10-CM | POA: Diagnosis present

## 2022-12-05 DIAGNOSIS — K219 Gastro-esophageal reflux disease without esophagitis: Secondary | ICD-10-CM | POA: Diagnosis present

## 2022-12-05 DIAGNOSIS — F32A Depression, unspecified: Secondary | ICD-10-CM | POA: Diagnosis present

## 2022-12-05 DIAGNOSIS — E669 Obesity, unspecified: Secondary | ICD-10-CM | POA: Diagnosis present

## 2022-12-05 DIAGNOSIS — E039 Hypothyroidism, unspecified: Secondary | ICD-10-CM | POA: Diagnosis present

## 2022-12-05 DIAGNOSIS — B001 Herpesviral vesicular dermatitis: Secondary | ICD-10-CM | POA: Diagnosis present

## 2022-12-05 DIAGNOSIS — I129 Hypertensive chronic kidney disease with stage 1 through stage 4 chronic kidney disease, or unspecified chronic kidney disease: Secondary | ICD-10-CM | POA: Diagnosis present

## 2022-12-05 DIAGNOSIS — Z6835 Body mass index (BMI) 35.0-35.9, adult: Secondary | ICD-10-CM

## 2022-12-05 DIAGNOSIS — E119 Type 2 diabetes mellitus without complications: Secondary | ICD-10-CM

## 2022-12-05 DIAGNOSIS — K120 Recurrent oral aphthae: Secondary | ICD-10-CM | POA: Diagnosis present

## 2022-12-05 DIAGNOSIS — H9190 Unspecified hearing loss, unspecified ear: Secondary | ICD-10-CM | POA: Diagnosis present

## 2022-12-05 DIAGNOSIS — Z808 Family history of malignant neoplasm of other organs or systems: Secondary | ICD-10-CM

## 2022-12-05 DIAGNOSIS — Z79899 Other long term (current) drug therapy: Secondary | ICD-10-CM

## 2022-12-05 DIAGNOSIS — Y9301 Activity, walking, marching and hiking: Secondary | ICD-10-CM | POA: Diagnosis present

## 2022-12-05 DIAGNOSIS — Z9884 Bariatric surgery status: Secondary | ICD-10-CM | POA: Diagnosis not present

## 2022-12-05 DIAGNOSIS — W010XXA Fall on same level from slipping, tripping and stumbling without subsequent striking against object, initial encounter: Secondary | ICD-10-CM | POA: Diagnosis present

## 2022-12-05 DIAGNOSIS — D539 Nutritional anemia, unspecified: Secondary | ICD-10-CM | POA: Diagnosis present

## 2022-12-05 HISTORY — PX: INTRAMEDULLARY (IM) NAIL INTERTROCHANTERIC: SHX5875

## 2022-12-05 LAB — CBC WITH DIFFERENTIAL/PLATELET
Abs Immature Granulocytes: 0.02 10*3/uL (ref 0.00–0.07)
Basophils Absolute: 0 10*3/uL (ref 0.0–0.1)
Basophils Relative: 1 %
Eosinophils Absolute: 0.1 10*3/uL (ref 0.0–0.5)
Eosinophils Relative: 1 %
HCT: 37.1 % (ref 36.0–46.0)
Hemoglobin: 12.6 g/dL (ref 12.0–15.0)
Immature Granulocytes: 0 %
Lymphocytes Relative: 28 %
Lymphs Abs: 1.8 10*3/uL (ref 0.7–4.0)
MCH: 35 pg — ABNORMAL HIGH (ref 26.0–34.0)
MCHC: 34 g/dL (ref 30.0–36.0)
MCV: 103.1 fL — ABNORMAL HIGH (ref 80.0–100.0)
Monocytes Absolute: 0.4 10*3/uL (ref 0.1–1.0)
Monocytes Relative: 6 %
Neutro Abs: 4 10*3/uL (ref 1.7–7.7)
Neutrophils Relative %: 64 %
Platelets: 302 10*3/uL (ref 150–400)
RBC: 3.6 MIL/uL — ABNORMAL LOW (ref 3.87–5.11)
RDW: 12.3 % (ref 11.5–15.5)
WBC: 6.3 10*3/uL (ref 4.0–10.5)
nRBC: 0 % (ref 0.0–0.2)

## 2022-12-05 LAB — TYPE AND SCREEN
ABO/RH(D): A POS
Antibody Screen: NEGATIVE

## 2022-12-05 LAB — PROTIME-INR
INR: 1 (ref 0.8–1.2)
Prothrombin Time: 13.5 seconds (ref 11.4–15.2)

## 2022-12-05 LAB — GLUCOSE, CAPILLARY
Glucose-Capillary: 211 mg/dL — ABNORMAL HIGH (ref 70–99)
Glucose-Capillary: 205 mg/dL — ABNORMAL HIGH (ref 70–99)
Glucose-Capillary: 280 mg/dL — ABNORMAL HIGH (ref 70–99)
Glucose-Capillary: 430 mg/dL — ABNORMAL HIGH (ref 70–99)

## 2022-12-05 LAB — ABO/RH: ABO/RH(D): A POS

## 2022-12-05 LAB — BASIC METABOLIC PANEL
Anion gap: 15 (ref 5–15)
BUN: 22 mg/dL (ref 8–23)
CO2: 20 mmol/L — ABNORMAL LOW (ref 22–32)
Calcium: 8.9 mg/dL (ref 8.9–10.3)
Chloride: 102 mmol/L (ref 98–111)
Creatinine, Ser: 1.27 mg/dL — ABNORMAL HIGH (ref 0.44–1.00)
GFR, Estimated: 45 mL/min — ABNORMAL LOW (ref >60)
Glucose, Bld: 179 mg/dL — ABNORMAL HIGH (ref 70–99)
Potassium: 3.5 mmol/L (ref 3.5–5.1)
Sodium: 137 mmol/L (ref 135–145)

## 2022-12-05 LAB — HEMOGLOBIN A1C
Hgb A1c MFr Bld: 9.4 % — ABNORMAL HIGH (ref 4.8–5.6)
Mean Plasma Glucose: 223 mg/dL

## 2022-12-05 SURGERY — FIXATION, FRACTURE, INTERTROCHANTERIC, WITH INTRAMEDULLARY ROD
Anesthesia: General | Site: Hip | Laterality: Left

## 2022-12-05 MED ORDER — FENTANYL CITRATE PF 50 MCG/ML IJ SOSY
50.0000 ug | PREFILLED_SYRINGE | Freq: Once | INTRAMUSCULAR | Status: AC
  Administered 2022-12-05: 50 ug via INTRAVENOUS
  Filled 2022-12-05: qty 1

## 2022-12-05 MED ORDER — HYDROMORPHONE HCL 1 MG/ML IJ SOLN
INTRAMUSCULAR | Status: AC
  Administered 2022-12-05: 0.5 mg
  Filled 2022-12-05: qty 0.5

## 2022-12-05 MED ORDER — CEFAZOLIN SODIUM 1 G IJ SOLR
INTRAMUSCULAR | Status: AC
  Filled 2022-12-05: qty 20

## 2022-12-05 MED ORDER — MIRTAZAPINE 15 MG PO TABS
15.0000 mg | ORAL_TABLET | Freq: Every day | ORAL | Status: DC
  Administered 2022-12-05 – 2022-12-10 (×6): 15 mg via ORAL
  Filled 2022-12-05 (×6): qty 1

## 2022-12-05 MED ORDER — METOCLOPRAMIDE HCL 5 MG PO TABS: 5.0000 mg | ORAL_TABLET | Freq: Three times a day (TID) | ORAL | Status: DC | PRN

## 2022-12-05 MED ORDER — FLEET ENEMA 7-19 GM/118ML RE ENEM: 1.0000 | ENEMA | Freq: Once | RECTAL | Status: DC | PRN

## 2022-12-05 MED ORDER — INSULIN ASPART 100 UNIT/ML IJ SOLN
0.0000 [IU] | INTRAMUSCULAR | Status: DC
  Administered 2022-12-05: 15 [IU] via SUBCUTANEOUS
  Administered 2022-12-05: 5 [IU] via SUBCUTANEOUS
  Administered 2022-12-06: 3 [IU] via SUBCUTANEOUS
  Administered 2022-12-06: 8 [IU] via SUBCUTANEOUS
  Administered 2022-12-06: 2 [IU] via SUBCUTANEOUS
  Filled 2022-12-05 (×5): qty 1

## 2022-12-05 MED ORDER — FENTANYL CITRATE (PF) 100 MCG/2ML IJ SOLN
INTRAMUSCULAR | Status: AC
  Filled 2022-12-05: qty 2

## 2022-12-05 MED ORDER — METOCLOPRAMIDE HCL 5 MG/ML IJ SOLN: 5.0000 mg | Freq: Three times a day (TID) | INTRAMUSCULAR | Status: DC | PRN

## 2022-12-05 MED ORDER — DEXMEDETOMIDINE HCL IN NACL 80 MCG/20ML IV SOLN
INTRAVENOUS | Status: DC | PRN
  Administered 2022-12-05: 8 ug via INTRAVENOUS

## 2022-12-05 MED ORDER — AMLODIPINE BESYLATE 5 MG PO TABS
5.0000 mg | ORAL_TABLET | Freq: Every day | ORAL | Status: DC
  Administered 2022-12-05: 5 mg via ORAL
  Filled 2022-12-05: qty 1

## 2022-12-05 MED ORDER — ALBUTEROL SULFATE (2.5 MG/3ML) 0.083% IN NEBU: 2.5000 mg | INHALATION_SOLUTION | RESPIRATORY_TRACT | Status: AC | PRN

## 2022-12-05 MED ORDER — OYSTER SHELL CALCIUM/D3 500-5 MG-MCG PO TABS
1.0000 | ORAL_TABLET | Freq: Every day | ORAL | Status: DC
  Administered 2022-12-06 – 2022-12-11 (×6): 1 via ORAL
  Filled 2022-12-05 (×6): qty 1

## 2022-12-05 MED ORDER — DOCUSATE SODIUM 100 MG PO CAPS
100.0000 mg | ORAL_CAPSULE | Freq: Two times a day (BID) | ORAL | Status: DC
  Administered 2022-12-05 – 2022-12-11 (×12): 100 mg via ORAL
  Filled 2022-12-05 (×14): qty 1

## 2022-12-05 MED ORDER — 0.9 % SODIUM CHLORIDE (POUR BTL) OPTIME
TOPICAL | Status: DC | PRN
  Administered 2022-12-05: 500 mL

## 2022-12-05 MED ORDER — ONDANSETRON HCL 4 MG/2ML IJ SOLN
INTRAMUSCULAR | Status: AC
  Filled 2022-12-05: qty 2

## 2022-12-05 MED ORDER — OXYCODONE HCL 5 MG/5ML PO SOLN: 5.0000 mg | Freq: Once | ORAL | Status: AC | PRN

## 2022-12-05 MED ORDER — GABAPENTIN 300 MG PO CAPS
300.0000 mg | ORAL_CAPSULE | Freq: Three times a day (TID) | ORAL | Status: DC
  Administered 2022-12-05 – 2022-12-06 (×3): 300 mg via ORAL
  Filled 2022-12-05 (×3): qty 1

## 2022-12-05 MED ORDER — SULFASALAZINE 500 MG PO TBEC
1000.0000 mg | DELAYED_RELEASE_TABLET | Freq: Two times a day (BID) | ORAL | Status: DC
  Administered 2022-12-05 – 2022-12-11 (×12): 1000 mg via ORAL
  Filled 2022-12-05 (×12): qty 2

## 2022-12-05 MED ORDER — GLIPIZIDE 10 MG PO TABS
10.0000 mg | ORAL_TABLET | Freq: Two times a day (BID) | ORAL | Status: DC
  Filled 2022-12-05 (×2): qty 1

## 2022-12-05 MED ORDER — DEXAMETHASONE SODIUM PHOSPHATE 10 MG/ML IJ SOLN
INTRAMUSCULAR | Status: AC
  Filled 2022-12-05: qty 1

## 2022-12-05 MED ORDER — LIDOCAINE HCL (CARDIAC) PF 100 MG/5ML IV SOSY
PREFILLED_SYRINGE | INTRAVENOUS | Status: DC | PRN
  Administered 2022-12-05: 80 mg via INTRAVENOUS

## 2022-12-05 MED ORDER — DULOXETINE HCL 30 MG PO CPEP
60.0000 mg | ORAL_CAPSULE | Freq: Two times a day (BID) | ORAL | Status: DC
  Administered 2022-12-05 – 2022-12-11 (×12): 60 mg via ORAL
  Filled 2022-12-05 (×12): qty 2

## 2022-12-05 MED ORDER — KETOROLAC TROMETHAMINE 15 MG/ML IJ SOLN
INTRAMUSCULAR | Status: AC
  Filled 2022-12-05: qty 1

## 2022-12-05 MED ORDER — DIPHENHYDRAMINE HCL 12.5 MG/5ML PO ELIX: 12.5000 mg | ORAL_SOLUTION | ORAL | Status: DC | PRN

## 2022-12-05 MED ORDER — BUPIVACAINE HCL (PF) 0.5 % IJ SOLN
INTRAMUSCULAR | Status: AC
  Filled 2022-12-05: qty 30

## 2022-12-05 MED ORDER — LOSARTAN POTASSIUM 50 MG PO TABS
50.0000 mg | ORAL_TABLET | Freq: Every day | ORAL | Status: DC
  Administered 2022-12-05 – 2022-12-06 (×2): 50 mg via ORAL
  Filled 2022-12-05 (×2): qty 1

## 2022-12-05 MED ORDER — FENTANYL CITRATE PF 50 MCG/ML IJ SOSY
100.0000 ug | PREFILLED_SYRINGE | Freq: Once | INTRAMUSCULAR | Status: AC
  Administered 2022-12-05: 100 ug via INTRAVENOUS
  Filled 2022-12-05: qty 2

## 2022-12-05 MED ORDER — SODIUM CHLORIDE 0.9 % IV SOLN: INTRAVENOUS | Status: DC

## 2022-12-05 MED ORDER — ONDANSETRON HCL 4 MG/2ML IJ SOLN: 4.0000 mg | Freq: Four times a day (QID) | INTRAMUSCULAR | Status: DC | PRN

## 2022-12-05 MED ORDER — CEFAZOLIN SODIUM-DEXTROSE 2-4 GM/100ML-% IV SOLN
2.0000 g | Freq: Three times a day (TID) | INTRAVENOUS | Status: AC
  Administered 2022-12-05 – 2022-12-06 (×2): 2 g via INTRAVENOUS
  Filled 2022-12-05 (×2): qty 100

## 2022-12-05 MED ORDER — ROCURONIUM BROMIDE 10 MG/ML (PF) SYRINGE
PREFILLED_SYRINGE | INTRAVENOUS | Status: AC
  Filled 2022-12-05: qty 10

## 2022-12-05 MED ORDER — OXYCODONE HCL 5 MG PO TABS
5.0000 mg | ORAL_TABLET | Freq: Once | ORAL | Status: AC | PRN
  Administered 2022-12-05: 5 mg via ORAL

## 2022-12-05 MED ORDER — PHENYLEPHRINE 80 MCG/ML (10ML) SYRINGE FOR IV PUSH (FOR BLOOD PRESSURE SUPPORT)
PREFILLED_SYRINGE | INTRAVENOUS | Status: AC
  Filled 2022-12-05: qty 10

## 2022-12-05 MED ORDER — ACETAMINOPHEN 10 MG/ML IV SOLN
1000.0000 mg | Freq: Once | INTRAVENOUS | Status: DC | PRN
  Administered 2022-12-05: 1000 mg via INTRAVENOUS

## 2022-12-05 MED ORDER — OXYCODONE HCL 5 MG PO TABS
ORAL_TABLET | ORAL | Status: AC
  Filled 2022-12-05: qty 1

## 2022-12-05 MED ORDER — CEFAZOLIN SODIUM-DEXTROSE 2-4 GM/100ML-% IV SOLN
2.0000 g | INTRAVENOUS | Status: AC
  Administered 2022-12-05: 2 g via INTRAVENOUS

## 2022-12-05 MED ORDER — ENOXAPARIN SODIUM 60 MG/0.6ML IJ SOSY: 0.5000 mg/kg | PREFILLED_SYRINGE | INTRAMUSCULAR | Status: DC

## 2022-12-05 MED ORDER — ROSUVASTATIN CALCIUM 10 MG PO TABS
40.0000 mg | ORAL_TABLET | Freq: Every day | ORAL | Status: DC
  Administered 2022-12-05 – 2022-12-11 (×7): 40 mg via ORAL
  Filled 2022-12-05 (×7): qty 4

## 2022-12-05 MED ORDER — PROPOFOL 10 MG/ML IV BOLUS
INTRAVENOUS | Status: DC | PRN
  Administered 2022-12-05: 90 mg via INTRAVENOUS

## 2022-12-05 MED ORDER — HYDROMORPHONE HCL 1 MG/ML IJ SOLN
0.5000 mg | INTRAMUSCULAR | Status: AC | PRN
  Administered 2022-12-05: 0.5 mg via INTRAVENOUS
  Filled 2022-12-05: qty 0.5

## 2022-12-05 MED ORDER — ONDANSETRON HCL 4 MG/2ML IJ SOLN: 4.0000 mg | Freq: Once | INTRAMUSCULAR | Status: DC | PRN

## 2022-12-05 MED ORDER — MAGNESIUM HYDROXIDE 400 MG/5ML PO SUSP
30.0000 mL | Freq: Every day | ORAL | Status: DC | PRN
  Administered 2022-12-10: 30 mL via ORAL
  Filled 2022-12-05 (×2): qty 30

## 2022-12-05 MED ORDER — PHENYLEPHRINE HCL-NACL 20-0.9 MG/250ML-% IV SOLN
INTRAVENOUS | Status: AC
  Filled 2022-12-05: qty 250

## 2022-12-05 MED ORDER — PHENYLEPHRINE HCL (PRESSORS) 10 MG/ML IV SOLN
INTRAVENOUS | Status: DC | PRN
  Administered 2022-12-05 (×7): 80 ug via INTRAVENOUS

## 2022-12-05 MED ORDER — ONDANSETRON HCL 4 MG PO TABS
4.0000 mg | ORAL_TABLET | Freq: Four times a day (QID) | ORAL | Status: DC | PRN
  Administered 2022-12-08 – 2022-12-09 (×2): 4 mg via ORAL
  Filled 2022-12-05 (×2): qty 1

## 2022-12-05 MED ORDER — ONDANSETRON HCL 4 MG PO TABS: 4.0000 mg | ORAL_TABLET | Freq: Four times a day (QID) | ORAL | Status: DC | PRN

## 2022-12-05 MED ORDER — KETOROLAC TROMETHAMINE 15 MG/ML IJ SOLN
15.0000 mg | Freq: Once | INTRAMUSCULAR | Status: AC
  Administered 2022-12-05: 15 mg via INTRAVENOUS

## 2022-12-05 MED ORDER — DEXAMETHASONE SODIUM PHOSPHATE 10 MG/ML IJ SOLN
INTRAMUSCULAR | Status: DC | PRN
  Administered 2022-12-05: 5 mg via INTRAVENOUS

## 2022-12-05 MED ORDER — LACTATED RINGERS IV SOLN: INTRAVENOUS | Status: DC

## 2022-12-05 MED ORDER — BUPIVACAINE-EPINEPHRINE (PF) 0.5% -1:200000 IJ SOLN
INTRAMUSCULAR | Status: DC | PRN
  Administered 2022-12-05: 30 mL

## 2022-12-05 MED ORDER — BISACODYL 10 MG RE SUPP
10.0000 mg | Freq: Every day | RECTAL | Status: DC | PRN
  Administered 2022-12-11: 10 mg via RECTAL
  Filled 2022-12-05: qty 1

## 2022-12-05 MED ORDER — ACETAMINOPHEN 10 MG/ML IV SOLN
INTRAVENOUS | Status: AC
  Filled 2022-12-05: qty 100

## 2022-12-05 MED ORDER — FENTANYL CITRATE (PF) 100 MCG/2ML IJ SOLN
25.0000 ug | INTRAMUSCULAR | Status: DC | PRN
  Administered 2022-12-05 (×2): 25 ug via INTRAVENOUS

## 2022-12-05 MED ORDER — ROCURONIUM BROMIDE 100 MG/10ML IV SOLN
INTRAVENOUS | Status: DC | PRN
  Administered 2022-12-05: 50 mg via INTRAVENOUS

## 2022-12-05 MED ORDER — HYDROCODONE-ACETAMINOPHEN 5-325 MG PO TABS
1.0000 | ORAL_TABLET | ORAL | Status: DC | PRN
  Administered 2022-12-05 – 2022-12-07 (×3): 1 via ORAL
  Administered 2022-12-07 – 2022-12-09 (×6): 2 via ORAL
  Administered 2022-12-09 – 2022-12-10 (×2): 1 via ORAL
  Administered 2022-12-10: 2 via ORAL
  Administered 2022-12-11: 1 via ORAL
  Filled 2022-12-05: qty 2
  Filled 2022-12-05: qty 1
  Filled 2022-12-05: qty 2
  Filled 2022-12-05: qty 1
  Filled 2022-12-05: qty 2
  Filled 2022-12-05 (×2): qty 1
  Filled 2022-12-05 (×4): qty 2
  Filled 2022-12-05: qty 1
  Filled 2022-12-05: qty 2

## 2022-12-05 MED ORDER — ENOXAPARIN SODIUM 40 MG/0.4ML IJ SOSY
40.0000 mg | PREFILLED_SYRINGE | INTRAMUSCULAR | Status: DC
  Administered 2022-12-06: 40 mg via SUBCUTANEOUS
  Filled 2022-12-05: qty 0.4

## 2022-12-05 MED ORDER — PROPOFOL 10 MG/ML IV BOLUS
INTRAVENOUS | Status: AC
  Filled 2022-12-05: qty 20

## 2022-12-05 MED ORDER — LIDOCAINE HCL (PF) 2 % IJ SOLN
INTRAMUSCULAR | Status: AC
  Filled 2022-12-05: qty 5

## 2022-12-05 MED ORDER — SUGAMMADEX SODIUM 200 MG/2ML IV SOLN
INTRAVENOUS | Status: DC | PRN
  Administered 2022-12-05: 200 mg via INTRAVENOUS

## 2022-12-05 MED ORDER — HYDROMORPHONE HCL 1 MG/ML IJ SOLN
0.5000 mg | Freq: Once | INTRAMUSCULAR | Status: AC
  Administered 2022-12-05: 0.5 mg via INTRAVENOUS
  Filled 2022-12-05: qty 0.5

## 2022-12-05 MED ORDER — EPINEPHRINE PF 1 MG/ML IJ SOLN
INTRAMUSCULAR | Status: AC
  Filled 2022-12-05: qty 1

## 2022-12-05 MED ORDER — ONDANSETRON HCL 4 MG/2ML IJ SOLN
INTRAMUSCULAR | Status: DC | PRN
  Administered 2022-12-05: 4 mg via INTRAVENOUS

## 2022-12-05 MED ORDER — FENTANYL CITRATE (PF) 100 MCG/2ML IJ SOLN
INTRAMUSCULAR | Status: DC | PRN
  Administered 2022-12-05: 50 ug via INTRAVENOUS
  Administered 2022-12-05 (×2): 25 ug via INTRAVENOUS

## 2022-12-05 MED ORDER — ONDANSETRON HCL 4 MG/2ML IJ SOLN: 4.0000 mg | Freq: Three times a day (TID) | INTRAMUSCULAR | Status: DC | PRN

## 2022-12-05 MED ORDER — KETOROLAC TROMETHAMINE 15 MG/ML IJ SOLN
7.5000 mg | Freq: Four times a day (QID) | INTRAMUSCULAR | Status: DC
  Administered 2022-12-05: 7.5 mg via INTRAVENOUS
  Filled 2022-12-05: qty 1

## 2022-12-05 MED ORDER — ACETAMINOPHEN 325 MG PO TABS: 325.0000 mg | ORAL_TABLET | Freq: Four times a day (QID) | ORAL | Status: DC | PRN

## 2022-12-05 SURGICAL SUPPLY — 48 items
BIT DRILL CROWE POINT TWST 4.3 (DRILL) IMPLANT
BNDG COHESIVE 4X5 TAN STRL LF (GAUZE/BANDAGES/DRESSINGS) ×1 IMPLANT
BNDG COHESIVE 6X5 TAN ST LF (GAUZE/BANDAGES/DRESSINGS) ×1 IMPLANT
CHLORAPREP W/TINT 26 (MISCELLANEOUS) ×2 IMPLANT
CORTICAL BONE SCR 5.0MM X 48MM (Screw) ×1 IMPLANT
DRAPE 3/4 80X56 (DRAPES) ×1 IMPLANT
DRAPE C-ARMOR (DRAPES) ×1 IMPLANT
DRILL CROWE POINT TWIST 4.3 (DRILL) ×1
DRSG MEPILEX SACRM 8.7X9.8 (GAUZE/BANDAGES/DRESSINGS) ×1 IMPLANT
DRSG OPSITE POSTOP 3X4 (GAUZE/BANDAGES/DRESSINGS) IMPLANT
DRSG OPSITE POSTOP 4X6 (GAUZE/BANDAGES/DRESSINGS) IMPLANT
ELECT CAUTERY BLADE 6.4 (BLADE) ×1 IMPLANT
ELECT REM PT RETURN 9FT ADLT (ELECTROSURGICAL) ×1
ELECTRODE REM PT RTRN 9FT ADLT (ELECTROSURGICAL) ×1 IMPLANT
GAUZE SPONGE 4X4 12PLY STRL (GAUZE/BANDAGES/DRESSINGS) ×1 IMPLANT
GLOVE BIO SURGEON STRL SZ8 (GLOVE) ×2 IMPLANT
GLOVE INDICATOR 8.0 STRL GRN (GLOVE) ×1 IMPLANT
GOWN STRL REUS W/ TWL LRG LVL3 (GOWN DISPOSABLE) ×1 IMPLANT
GOWN STRL REUS W/ TWL XL LVL3 (GOWN DISPOSABLE) ×1 IMPLANT
GOWN STRL REUS W/TWL LRG LVL3 (GOWN DISPOSABLE) ×1
GOWN STRL REUS W/TWL XL LVL3 (GOWN DISPOSABLE) ×1
GUIDEPIN VERSANAIL DSP 3.2X444 (ORTHOPEDIC DISPOSABLE SUPPLIES) IMPLANT
GUIDEWIRE BALL NOSE 100CM (WIRE) IMPLANT
HANDLE YANKAUER SUCT OPEN TIP (MISCELLANEOUS) ×1 IMPLANT
HFN LH 130 DEG 11MM X 380MM (Orthopedic Implant) IMPLANT
MANIFOLD NEPTUNE II (INSTRUMENTS) ×1 IMPLANT
MAT ABSORB  FLUID 56X50 GRAY (MISCELLANEOUS) ×1
MAT ABSORB FLUID 56X50 GRAY (MISCELLANEOUS) ×1 IMPLANT
NDL FILTER BLUNT 18X1 1/2 (NEEDLE) ×1 IMPLANT
NDL HYPO 22X1.5 SAFETY MO (MISCELLANEOUS) ×1 IMPLANT
NEEDLE FILTER BLUNT 18X1 1/2 (NEEDLE) ×1 IMPLANT
NEEDLE HYPO 22X1.5 SAFETY MO (MISCELLANEOUS) ×1 IMPLANT
NS IRRIG 500ML POUR BTL (IV SOLUTION) ×1 IMPLANT
PACK HIP COMPR (MISCELLANEOUS) ×1 IMPLANT
SCREW BONE CORTICAL 5.0X42 (Screw) IMPLANT
SCREW BONE CORTICAL 5.0X50 (Screw) IMPLANT
SCREW CORTICL BON 5.0MM X 48MM (Screw) IMPLANT
SCREW LAG 10.5MMX105MM HFN (Screw) IMPLANT
STAPLER SKIN PROX 35W (STAPLE) ×1 IMPLANT
STRAP SAFETY 5IN WIDE (MISCELLANEOUS) ×1 IMPLANT
SUT VIC AB 0 CT1 36 (SUTURE) ×1 IMPLANT
SUT VIC AB 1 CT1 36 (SUTURE) ×1 IMPLANT
SUT VIC AB 2-0 CT1 (SUTURE) ×2 IMPLANT
SYR 10ML LL (SYRINGE) ×1 IMPLANT
SYR 30ML LL (SYRINGE) ×1 IMPLANT
TAPE MICROFOAM 4IN (TAPE) ×1 IMPLANT
TRAP FLUID SMOKE EVACUATOR (MISCELLANEOUS) ×1 IMPLANT
WATER STERILE IRR 500ML POUR (IV SOLUTION) ×1 IMPLANT

## 2022-12-05 NOTE — ED Triage Notes (Signed)
Arrives [facility] from home after her diabetic monitor notified her of low blood sugar of 70. She then got up to get an orange but became dizzy and fell to the ground. Denies head injury or anticoagulants.  Arrives with severe left upper leg pain with left leg shortening and rotation. Pedal pulses present.

## 2022-12-05 NOTE — Assessment & Plan Note (Signed)
Positive intertrochanteric left femur fracture with varus deformity status post mechanical fall Per report, Dr. Joice Lofts with orthopedic surgery aware of case Plan for likely operative repair today N.p.o. Pain control Follow

## 2022-12-05 NOTE — Transfer of Care (Signed)
Immediate Anesthesia Transfer of Care Note  Patient: Kaitlin Kelley  Procedure(s) Performed: INTRAMEDULLARY (IM) NAIL INTERTROCHANTERIC (Left: Hip)  Patient Location: PACU  Anesthesia Type:General  Level of Consciousness: drowsy  Airway & Oxygen Therapy: Patient Spontanous Breathing and Patient connected to face mask oxygen  Post-op Assessment: Report given to RN and Post -op Vital signs reviewed and stable  Post vital signs: Reviewed and stable  Last Vitals:  Vitals Value Taken Time  BP 145/70 12/05/22 1439  Temp 35.8 1439  Pulse 78 12/05/22 1444  Resp 12 12/05/22 1444  SpO2 99 % 12/05/22 1444  Vitals shown include unvalidated device data.  Last Pain:  Vitals:   12/05/22 1110  PainSc: 6       Patients Stated Pain Goal: 0 (12/05/22 0334)  Complications: No notable events documented.

## 2022-12-05 NOTE — Assessment & Plan Note (Signed)
Continue Synthroid postoperatively

## 2022-12-05 NOTE — Assessment & Plan Note (Signed)
BP stable Titrate home regimen 

## 2022-12-05 NOTE — Assessment & Plan Note (Signed)
SSI A1c 

## 2022-12-05 NOTE — ED Notes (Signed)
ED TO INPATIENT HANDOFF REPORT  ED Nurse Name and Phone #: Manus Gunning, RN   S Name/Age/Gender Kaitlin Kelley 72 y.o. female Room/Bed: ED03A/ED03A  Code Status   Code Status: Prior  Home/SNF/Other Rehab - estimation  Patient oriented to: self, place, time, and situation Is this baseline? Yes   Triage Complete: Triage complete  Chief Complaint Hip fracture [facility]) [S72.009A]  Triage Note Arrives [facility] from home after her diabetic monitor notified her of low blood sugar of 70. She then got up to get an orange but became dizzy and fell to the ground. Denies head injury or anticoagulants.  Arrives with severe left upper leg pain with left leg shortening and rotation. Pedal pulses present.      Allergies No Known Allergies  Level of Care/Admitting Diagnosis ED Disposition     ED Disposition  Admit   Condition  --   Comment  Hospital Area: [facility] [[id number]]  Level of Care: Med-Surg [16]  Covid Evaluation: Asymptomatic - no recent exposure (last 10 days) testing not required  Diagnosis: Hip fracture [facility]) [[id number]]  Admitting Physician: Andris Baumann [[id number]]  Attending Physician: Andris Baumann [[id number]]  Certification:: I certify this patient will need inpatient services for at least 2 midnights  Estimated Length of Stay: 3          B Medical/Surgery History Past Medical History:  Diagnosis Date   Arthritis    Chronic cough    Depression    Diabetes mellitus without complication (HCC)    Edema    LEGS/FEET   GERD (gastroesophageal reflux disease)    NO MEDS   Headache    H/O MIGRAINES   HOH (hard of hearing)    Hypertension    Hypothyroidism    IBS (irritable bowel syndrome)    Sleep apnea    CPAP   Past Surgical History:  Procedure Laterality Date   EYE SURGERY     GAS INSERTION Left 03/17/2015   Procedure: INSERTION OF GAS;  Surgeon: Marcelene Butte, MD;  Location: [facility] ORS;  Service: Ophthalmology;  Laterality: Left;   [id number]   GASTRIC BYPASS     HAND SURGERY     THUMB AND PINKY FINGER    PARS PLANA VITRECTOMY Left 03/17/2015   Procedure: PARS PLANA VITRECTOMY WITH 25 GAUGE;  Surgeon: Marcelene Butte, MD;  Location: [facility] ORS;  Service: Ophthalmology;  Laterality: Left;   SHOULDER ARTHROSCOPY     TONSILLECTOMY     TUBAL LIGATION     VIDEO BRONCHOSCOPY WITH ENDOBRONCHIAL NAVIGATION N/A 03/05/2020   Procedure: VIDEO BRONCHOSCOPY WITH ENDOBRONCHIAL NAVIGATION;  Surgeon: Vida Rigger, MD;  Location: [facility] ORS;  Service: Thoracic;  Laterality: N/A;     A IV Location/Drains/Wounds Patient Lines/Drains/Airways Status     Active Line/Drains/Airways     Name Placement date Placement time Site Days   Peripheral IV 12/05/22 20 G 1" Left Antecubital 12/05/22  0336  Antecubital  less than 1            Intake/Output Last 24 hours No intake or output data in the 24 hours ending 12/05/22 0607  Labs/Imaging Results for orders placed or performed during the hospital encounter of 12/05/22 (from the past 48 hour(s))  CBC with Differential/Platelet     Status: Abnormal   Collection Time: 12/05/22  3:42 AM  Result Value Ref Range   WBC 6.3 4.0 - 10.5 K/uL   RBC 3.60 (L) 3.87 - 5.11 MIL/uL   Hemoglobin 12.6 12.0 -  15.0 g/dL   HCT 16.1 09.6 - 04.5 %   MCV 103.1 (H) 80.0 - 100.0 fL   MCH 35.0 (H) 26.0 - 34.0 pg   MCHC 34.0 30.0 - 36.0 g/dL   RDW 40.9 81.1 - 91.4 %   Platelets 302 150 - 400 K/uL   nRBC 0.0 0.0 - 0.2 %   Neutrophils Relative % 64 %   Neutro Abs 4.0 1.7 - 7.7 K/uL   Lymphocytes Relative 28 %   Lymphs Abs 1.8 0.7 - 4.0 K/uL   Monocytes Relative 6 %   Monocytes Absolute 0.4 0.1 - 1.0 K/uL   Eosinophils Relative 1 %   Eosinophils Absolute 0.1 0.0 - 0.5 K/uL   Basophils Relative 1 %   Basophils Absolute 0.0 0.0 - 0.1 K/uL   Immature Granulocytes 0 %   Abs Immature Granulocytes 0.02 0.00 - 0.07 K/uL    Comment: Performed at [facility], [address], [place], [place] [address]   Basic metabolic panel     Status: Abnormal   Collection Time: 12/05/22  3:42 AM  Result Value Ref Range   Sodium 137 135 - 145 mmol/L   Potassium 3.5 3.5 - 5.1 mmol/L   Chloride 102 98 - 111 mmol/L   CO2 20 (L) 22 - 32 mmol/L   Glucose, Bld 179 (H) 70 - 99 mg/dL    Comment: Glucose reference range applies only to samples taken after fasting for at least 8 hours.   BUN 22 8 - 23 mg/dL   Creatinine, Ser 6.21 (H) 0.44 - 1.00 mg/dL   Calcium 8.9 8.9 - 30.8 mg/dL   GFR, Estimated 45 (L) >60 mL/min    Comment: (NOTE) Calculated using the CKD-EPI Creatinine Equation (2021)    Anion gap 15 5 - 15    Comment: Performed at [facility], [address] [place], [place] [address]  Type and screen [facility]     Status: None   Collection Time: 12/05/22  3:42 AM  Result Value Ref Range   ABO/RH(D) A POS    Antibody Screen NEG    Sample Expiration      12/08/2022,2359 Performed at [facility], [address] [place], [place] [address]   Protime-INR     Status: None   Collection Time: 12/05/22  3:42 AM  Result Value Ref Range   Prothrombin Time 13.5 11.4 - 15.2 seconds   INR 1.0 0.8 - 1.2    Comment: (NOTE) INR goal varies based on device and disease states. Performed at [facility], [address] [place], [place] [address]    DG Chest Portable 1 View  Result Date: 12/05/2022 CLINICAL DATA:  Fall injury. EXAM: PORTABLE CHEST 1 VIEW COMPARISON:  AP Lat 11/08/2022 FINDINGS: There is mild cardiomegaly. There is aortic tortuosity and mild ectasia with patchy calcifications and stable mediastinum. No vascular congestion is seen. The lungs are clear with asymmetric chronic elevation of the right diaphragm. No pleural effusion is evident. There is osteopenia and bridging enthesopathy of the thoracic spine. Multiple overlying telemetry leads. IMPRESSION: No acute radiographic chest findings.  Chronic findings as above. Electronically Signed    By: Almira Bar M.D.   On: 12/05/2022 04:50   DG HIP UNILAT WITH PELVIS 2-3 VIEWS LEFT  Result Date: 12/05/2022 CLINICAL DATA:  Hip fracture EXAM: DG HIP (WITH OR WITHOUT PELVIS) 2-3V LEFT COMPARISON:  None Available. FINDINGS: Acute intertrochanteric left femur fracture with varus angulation. No evidence  of pelvic ring fracture or diastasis. Subjective osteopenia. IMPRESSION: Acute intertrochanteric left femur fracture with varus deformity. Electronically Signed   By: Tiburcio Pea M.D.   On: 12/05/2022 04:44    Pending Labs Unresulted Labs (From admission, onward)    None       Vitals/Pain Today's Vitals   12/05/22 0441 12/05/22 0500 12/05/22 0530 12/05/22 0600  BP:  135/77 127/73 (!) 141/77  Pulse:  77 74 71  Resp:  10 12 14   Temp:      SpO2:  96% 95% 96%  Weight:      Height:      PainSc: 5        Isolation Precautions No active isolations  Medications Medications  ceFAZolin (ANCEF) IVPB 2g/100 mL premix (has no administration in time range)  HYDROmorphone (DILAUDID) injection 0.5 mg (has no administration in time range)  ondansetron (ZOFRAN) injection 4 mg (has no administration in time range)  albuterol (PROVENTIL) (2.5 MG/3ML) 0.083% nebulizer solution 2.5 mg (has no administration in time range)  fentaNYL (SUBLIMAZE) injection 100 mcg (100 mcg Intravenous Given 12/05/22 0336)  fentaNYL (SUBLIMAZE) injection 50 mcg (50 mcg Intravenous Given 12/05/22 0357)  HYDROmorphone (DILAUDID) injection 0.5 mg (0.5 mg Intravenous Given 12/05/22 0413)  HYDROmorphone (DILAUDID) 1 MG/ML injection (0.5 mg  Given 12/05/22 0418)    Mobility Ambulatory at baseline. While in ER non-ambulatory      Focused Assessments     R Recommendations: See Admitting Provider Note  Report given to:   Additional Notes: Arrives EMS after a fall from home. Was notified of low blood glucose via her monitor and she was getting up to go get an orange and became dizzy - passing out landing on  left help. Radiographics show fracture requiring surgical procedure. Alert, oriented and ambulatory at baseline. 20g in L-ac. Multiple doses of pain medications.

## 2022-12-05 NOTE — Anesthesia Procedure Notes (Signed)
Procedure Name: Intubation Date/Time: 12/05/2022 12:00 PM  Performed by: Morene Crocker, CRNAPre-anesthesia Checklist: Patient identified, Patient being monitored, Timeout performed, Emergency Drugs available and Suction available Patient Re-evaluated:Patient Re-evaluated prior to induction Oxygen Delivery Method: Circle system utilized Preoxygenation: Pre-oxygenation with 100% oxygen Induction Type: IV induction Ventilation: Mask ventilation without difficulty Laryngoscope Size: 3 and McGraph Grade View: Grade I Tube type: Oral Tube size: 7.0 mm Number of attempts: 1 Airway Equipment and Method: Stylet Placement Confirmation: ETT inserted through vocal cords under direct vision, positive ETCO2 and breath sounds checked- equal and bilateral Secured at: 21 cm Tube secured with: Tape Dental Injury: Teeth and Oropharynx as per pre-operative assessment  Comments: Smooth, atraumatic intubation. No complications noted.

## 2022-12-05 NOTE — Assessment & Plan Note (Signed)
Appears stable Chest x-ray within normal limits Continue home regimen postoperatively

## 2022-12-05 NOTE — Progress Notes (Signed)
PHARMACIST - PHYSICIAN COMMUNICATION  CONCERNING:  Enoxaparin (Lovenox) for DVT Prophylaxis    RECOMMENDATION: Patient was prescribed enoxaprin 40mg  q24 hours for VTE prophylaxis.   Filed Weights   12/05/22 0334 12/05/22 0655  Weight: 102.1 kg (225 lb) 100.7 kg (222 lb 0.1 oz)    Body mass index is 35.83 kg/m.  Estimated Creatinine Clearance: 47.3 mL/min (A) (by C-G formula based on SCr of 1.27 mg/dL (H)).   Based on [facility] policy patient is candidate for enoxaparin 0.5mg /kg TBW SQ every 24 hours based on BMI being >30.   DESCRIPTION: Pharmacy has adjusted enoxaparin dose per [facility] policy.  Patient is now receiving enoxaparin 50 mg every 24 hours    Barrie Folk, PharmD Clinical Pharmacist  12/05/2022 7:44 AM

## 2022-12-05 NOTE — H&P (Addendum)
History and Physical    Patient: Kaitlin Kelley ZOX:[id number] DOB: [birth year] DOA: 12/05/2022 DOS: the patient was seen and examined on 12/05/2022 PCP: Elissa Hefty, MD  Patient coming from: Home  Chief Complaint:  Chief Complaint  Patient presents with   Fall   HPI: Kaitlin Kelley is a 72 y.o. female with medical history significant of hypertension, hypothyroidism, type 2 diabetes, sarcoidosis,?  Rheumatoid arthritis presenting with fall and left hip fracture.  Patient reports feeling her sugar was low overnight.  Patient attempted to go to the fridge to get something to eat.  Developed some generalized weakness with subsequent fall.  Denies any head trauma loss consciousness.  Patient landed on her left hip directly.  Patient denies any chest pain, shortness of breath, hemiparesis or confusion associated with the fall.  No abdominal pain or diarrhea.  No recent medication changes. Presented to the ER afebrile, hemodynamically stable.  Satting well on room air.  White count 6.3, hemoglobin 12.6, platelets 302, creatinine 1.27, glucose 179.  Chest x-ray grossly stable.  Left hip plain films with acute intertrochanteric left femur fracture with varus deformity  Review of Systems: As mentioned in the history of present illness. All other systems reviewed and are negative. Past Medical History:  Diagnosis Date   Arthritis    Chronic cough    Depression    Diabetes mellitus without complication (HCC)    Edema    LEGS/FEET   GERD (gastroesophageal reflux disease)    NO MEDS   Headache    H/O MIGRAINES   HOH (hard of hearing)    Hypertension    Hypothyroidism    IBS (irritable bowel syndrome)    Sleep apnea    CPAP   Past Surgical History:  Procedure Laterality Date   EYE SURGERY     GAS INSERTION Left 03/17/2015   Procedure: INSERTION OF GAS;  Surgeon: Marcelene Butte, MD;  Location: [facility] ORS;  Service: Ophthalmology;  Laterality: Left;  [id number]   GASTRIC BYPASS     HAND  SURGERY     THUMB AND PINKY FINGER    PARS PLANA VITRECTOMY Left 03/17/2015   Procedure: PARS PLANA VITRECTOMY WITH 25 GAUGE;  Surgeon: Marcelene Butte, MD;  Location: [facility];  Service: Ophthalmology;  Laterality: Left;   SHOULDER ARTHROSCOPY     TONSILLECTOMY     TUBAL LIGATION     VIDEO BRONCHOSCOPY WITH ENDOBRONCHIAL NAVIGATION N/A 03/05/2020   Procedure: VIDEO BRONCHOSCOPY WITH ENDOBRONCHIAL NAVIGATION;  Surgeon: Vida Rigger, MD;  Location: [facility] OR[facility];  Service: Thoracic;  Laterality: N/A;   Social History:  reports that she has never smoked. She has never used smokeless tobacco. She reports that she does not currently use alcohol. She reports that she does not use drugs.  No Known Allergies  Family History  Problem Relation Age of Onset   Leukemia Brother        died at 73 years   Diabetes Brother    Prostate cancer Brother    Breast cancer Paternal Aunt    Brain cancer Cousin    Diabetes Father    Macular degeneration Niece     Prior to Admission medications   Medication Sig Start Date End Date Taking? Authorizing Provider  amLODipine (NORVASC) 5 MG tablet Take 5 mg by mouth at bedtime.  06/10/19   [provider]  azelastine (ASTELIN) 0.1 % nasal spray Place 1 spray into both nostrils as needed for allergies. 11/19/19   [provider]  Bacillus Coagulans-Inulin (  ALIGN PREBIOTIC-PROBIOTIC PO) Take 1 capsule by mouth daily.    [provider]  calcium-vitamin D (OSCAL WITH D) 500-5 MG-MCG tablet Take 1 tablet by mouth. 1000mg     [provider]  Cholecalciferol 50 MCG (2000 UT) CAPS Take 2,000 Units by mouth daily.     [provider]  Collagen-Vitamin C-Biotin (COLLAGEN 1500/C) 500-50-0.8 MG CAPS daily.    [provider]  cyanocobalamin 100 MCG tablet Take by mouth.    [provider]  Cyanocobalamin 3000 MCG CAPS Take 3,000 mcg by mouth daily.  04/02/18   [provider]  dicyclomine (BENTYL) 10 MG  capsule Take 10 mg by mouth 4 (four) times daily as needed for spasms.  09/12/19   [provider]  dorzolamide (TRUSOPT) 2 % ophthalmic solution Place 1 drop into both eyes in the morning and at bedtime.  12/25/18   [provider]  Dulaglutide (TRULICITY) 1.5 MG/0.5ML SOPN Inject 0.5 mLs into the skin once a week. Patient not taking: Reported on 02/19/2020 12/03/19   [provider]  DULoxetine (CYMBALTA) 60 MG capsule Take 60 mg by mouth 2 (two) times daily.     [provider]  ferrous gluconate (FERGON) 240 (27 FE) MG tablet Take by mouth.    [provider]  folic acid (FOLVITE) 1 MG tablet Take 1 mg by mouth daily.    [provider]  gabapentin (NEURONTIN) 300 MG capsule Take 300 mg by mouth 3 (three) times daily.    [provider]  glipiZIDE (GLUCOTROL) 10 MG tablet Take 10 mg by mouth in the morning and at bedtime.  11/24/19   [provider]  insulin detemir (LEVEMIR) 100 UNIT/ML injection Inject 30 Units into the skin at bedtime.     [provider]  Insulin Glargine-Lixisenatide (SOLIQUA) 100-33 UNT-MCG/ML SOPN Inject 30 Units into the skin at bedtime.    [provider]  Lactobacillus 0.05-0.05 MG TABS Take by mouth.    [provider]  Lancets 28G MISC use to check blood sugar three to four times a day or as directed Patient not taking: Reported on 07/24/2022 05/15/14   [provider]  levothyroxine (SYNTHROID) 75 MCG tablet Take 1 tablet by mouth daily. Patient not taking: Reported on 07/24/2022 12/29/21   [provider]  levothyroxine (SYNTHROID, LEVOTHROID) 75 MCG tablet Take 75 mcg by mouth See admin instructions. Take 75 mcg by mouth daily before breakfast Monday through Saturday Patient not taking: Reported on 07/24/2022    [provider]  losartan (COZAAR) 50 MG tablet Take 50 mg by mouth daily.    [provider]  methotrexate (RHEUMATREX) 2.5 MG  tablet TAKE 3 TABLETS (7.5 MG TOTAL) BY MOUTH EVERY 7 (SEVEN) DAYS 05/23/20   [provider]  mirtazapine (REMERON) 15 MG tablet Take 15 mg by mouth at bedtime. 01/07/20   [provider]  modafinil (PROVIGIL) 100 MG tablet Take by mouth. 07/17/22 10/15/22  [provider]  Multiple Vitamin tablet Take 1 tablet by mouth daily.    [provider]  Multiple Vitamins-Minerals (AIRBORNE PO) Take 2 tablets by mouth in the morning and at bedtime.    [provider]  rosuvastatin (CRESTOR) 40 MG tablet Take 40 mg by mouth at bedtime.  Patient not taking: Reported on 07/24/2022 05/14/19   [provider]  rosuvastatin (CRESTOR) 40 MG tablet Take 1 tablet by mouth daily. 02/22/22   [provider]  sulfaSALAzine (AZULFIDINE) 500 MG  tablet Take 1,000 mg by mouth 2 (two) times daily.    [provider]  sulfaSALAzine (AZULFIDINE) 500 MG tablet Take 2 tablets by mouth 2 (two) times daily. Patient not taking: Reported on 07/24/2022 02/02/20   [provider]  TURMERIC PO Take 1 capsule by mouth daily.     [provider]    Physical Exam: Vitals:   12/05/22 0600 12/05/22 0655 12/05/22 0657 12/05/22 0717  BP: (!) 141/77  (!) 143/76 118/67  Pulse: 71  77 81  Resp: 14  16 17   Temp:  97.8 F (36.6 C) 97.8 F (36.6 C) 98.3 F (36.8 C)  SpO2: 96%  92% 96%  Weight:  100.7 kg    Height:       Physical Exam Constitutional:      Appearance: She is obese.     Comments: Mild lethargy status post narcotics for pain control  HENT:     Head: Normocephalic.     Nose: Nose normal.     Mouth/Throat:     Mouth: Mucous membranes are moist.  Eyes:     Pupils: Pupils are equal, round, and reactive to light.  Cardiovascular:     Rate and Rhythm: Normal rate and regular rhythm.  Pulmonary:     Effort: Pulmonary effort is normal.     Breath sounds: Normal breath sounds.  Abdominal:     General: Bowel sounds are normal.   Musculoskeletal:     Comments: Decreased range of motion in the left lower extremity and mild tenderness palpation over the left hip  Skin:    General: Skin is warm.  Neurological:     General: No focal deficit present.  Psychiatric:        Mood and Affect: Mood normal.     Data Reviewed:  There are no new results to review at this time. DG Chest Portable 1 View CLINICAL DATA:  Fall injury.  EXAM: PORTABLE CHEST 1 VIEW  COMPARISON:  AP Lat 11/08/2022  FINDINGS: There is mild cardiomegaly. There is aortic tortuosity and mild ectasia with patchy calcifications and stable mediastinum.  No vascular congestion is seen. The lungs are clear with asymmetric chronic elevation of the right diaphragm. No pleural effusion is evident.  There is osteopenia and bridging enthesopathy of the thoracic spine. Multiple overlying telemetry leads.  IMPRESSION: No acute radiographic chest findings.  Chronic findings as above.  Electronically Signed   By: Almira Bar M.D.   On: 12/05/2022 04:50 DG HIP UNILAT WITH PELVIS 2-3 VIEWS LEFT CLINICAL DATA:  Hip fracture  EXAM: DG HIP (WITH OR WITHOUT PELVIS) 2-3V LEFT  COMPARISON:  None Available.  FINDINGS: Acute intertrochanteric left femur fracture with varus angulation. No evidence of pelvic ring fracture or diastasis. Subjective osteopenia.  IMPRESSION: Acute intertrochanteric left femur fracture with varus deformity.  Electronically Signed   By: Tiburcio Pea M.D.   On: 12/05/2022 04:44  Lab Results  Component Value Date   WBC 6.3 12/05/2022   HGB 12.6 12/05/2022   HCT 37.1 12/05/2022   MCV 103.1 (H) 12/05/2022   PLT 302 12/05/2022   Last metabolic panel Lab Results  Component Value Date   GLUCOSE 179 (H) 12/05/2022   NA 137 12/05/2022   K 3.5 12/05/2022   CL 102 12/05/2022   CO2 20 (L) 12/05/2022   BUN 22 12/05/2022   CREATININE 1.27 (H) 12/05/2022   GFRNONAA 45 (L) 12/05/2022   CALCIUM 8.9 12/05/2022    PROT 6.6 01/19/2020  ALBUMIN 4.1 01/19/2020   BILITOT 0.5 01/19/2020   ALKPHOS 90 01/19/2020   AST 42 (H) 01/19/2020   ALT 51 (H) 01/19/2020   ANIONGAP 15 12/05/2022    Assessment and Plan: * Hip fracture (HCC) Positive intertrochanteric left femur fracture with varus deformity status post mechanical fall Per report, Dr. Joice Lofts with orthopedic surgery aware of case Plan for likely operative repair today N.p.o. Pain control Follow  CKD (chronic kidney disease) stage 3, GFR 30-59 ml/min (HCC) Creatinine 1.3 today with GFR in the 40s Appears to be near baseline Will otherwise monitor Hold nephrotoxic agents Follow  Fall at home, initial encounter Follow home with secondary left hip fracture today in the setting of transient hypoglycemia Will place on fall precautions PT OT evaluation. Follow  Hypothyroidism Continue Synthroid postoperatively  Sarcoidosis Appears stable Chest x-ray within normal limits Continue home regimen postoperatively  Type 2 diabetes mellitus (HCC) SSI A1c  Hypertension BP stable Titrate home regimen      Advance Care Planning:   Code Status: Full Code   Consults: Orthopedics w/ Dr. Joice Lofts   Family Communication: No family at the bedside  Greater than 50% was spent in counseling and coordination of care with patient Total encounter time 80 minutes or more   Severity of Illness: The appropriate patient status for this patient is INPATIENT. Inpatient status is judged to be reasonable and necessary in order to provide the required intensity of service to ensure the patient's safety. The patient's presenting symptoms, physical exam findings, and initial radiographic and laboratory data in the context of their chronic comorbidities is felt to place them at high risk for further clinical deterioration. Furthermore, it is not anticipated that the patient will be medically stable for discharge from the hospital within 2 midnights of admission.    * I certify that at the point of admission it is my clinical judgment that the patient will require inpatient hospital care spanning beyond 2 midnights from the point of admission due to high intensity of service, high risk for further deterioration and high frequency of surveillance required.*  Author: Floydene Flock, MD 12/05/2022 7:45 AM  For on call review [email or web address].

## 2022-12-05 NOTE — Assessment & Plan Note (Signed)
Follow home with secondary left hip fracture today in the setting of transient hypoglycemia Will place on fall precautions PT OT evaluation. Follow

## 2022-12-05 NOTE — ED Provider Notes (Signed)
[facility] Provider Note    Event Date/Time   First MD Initiated Contact with Patient 12/05/22 [phone]     (approximate)   History   Fall   HPI  Kaitlin Kelley is a 72 y.o. female who presents to the ED for evaluation of Fall   Patient history of DM and sarcoidosis presents after mechanical fall from home with left hip pain.  She reports that she was walking to the refrigerator to get her Norins as she felt like her blood glucose levels were low, she reports feeling lightheaded and dizzy and falling to the ground without syncope causing left hip pain and injury.  EMS reports her glucose levels were normal on route.  She reports severe left hip pain.   Physical Exam   Triage Vital Signs: ED Triage Vitals [12/05/22 0334]  Enc Vitals Group     BP 136/82     Pulse Rate 75     Resp 19     Temp 98.1 F (36.7 C)     Temp src      SpO2 100 %     Weight 225 lb (102.1 kg)     Height 5\' 6"  (1.676 m)     Head Circumference      Peak Flow      Pain Score 10     Pain Loc      Pain Edu?      Excl. in GC?     Most recent vital signs: Vitals:   12/05/22 0500 12/05/22 0530  BP: 135/77 127/73  Pulse: 77 74  Resp: 10 12  Temp:    SpO2: 96% 95%    General: Awake.  Clearly uncomfortable CV:  Good peripheral perfusion.  Resp:  Normal effort.  Abd:  No distention.  MSK:  Shortening and rotation of the left leg is noted Neuro:  No focal deficits appreciated. Other:     ED Results / Procedures / Treatments   Labs (all labs ordered are listed, but only abnormal results are displayed) Labs Reviewed  CBC WITH DIFFERENTIAL/PLATELET - Abnormal; Notable for the following components:      Result Value   RBC 3.60 (*)    MCV 103.1 (*)    MCH 35.0 (*)    All other components within normal limits  BASIC METABOLIC PANEL - Abnormal; Notable for the following components:   CO2 20 (*)    Glucose, Bld 179 (*)    Creatinine, Ser 1.27 (*)    GFR, Estimated  45 (*)    All other components within normal limits  PROTIME-INR  TYPE AND SCREEN    EKG Sinus rhythm with a rate of 73 bpm.  Normal axis and intervals.  No clear signs of acute ischemia.  RADIOLOGY CXR interpreted by me without evidence of acute cardiopulmonary pathology. Plain film of the pelvis and left hip interpreted by me with an intertrochanteric fracture of the left femur  Official radiology report(s): DG Chest Portable 1 View  Result Date: 12/05/2022 CLINICAL DATA:  Fall injury. EXAM: PORTABLE CHEST 1 VIEW COMPARISON:  AP Lat 11/08/2022 FINDINGS: There is mild cardiomegaly. There is aortic tortuosity and mild ectasia with patchy calcifications and stable mediastinum. No vascular congestion is seen. The lungs are clear with asymmetric chronic elevation of the right diaphragm. No pleural effusion is evident. There is osteopenia and bridging enthesopathy of the thoracic spine. Multiple overlying telemetry leads. IMPRESSION: No acute radiographic chest findings.  Chronic findings as above. Electronically  Signed   By: Almira Bar M.D.   On: 12/05/2022 04:50   DG HIP UNILAT WITH PELVIS 2-3 VIEWS LEFT  Result Date: 12/05/2022 CLINICAL DATA:  Hip fracture EXAM: DG HIP (WITH OR WITHOUT PELVIS) 2-3V LEFT COMPARISON:  None Available. FINDINGS: Acute intertrochanteric left femur fracture with varus angulation. No evidence of pelvic ring fracture or diastasis. Subjective osteopenia. IMPRESSION: Acute intertrochanteric left femur fracture with varus deformity. Electronically Signed   By: Tiburcio Pea M.D.   On: 12/05/2022 04:44    PROCEDURES and INTERVENTIONS:  .1-3 Lead EKG Interpretation  Performed by: Delton Prairie, MD Authorized by: Delton Prairie, MD     Interpretation: normal     ECG rate:  70   ECG rate assessment: normal     Rhythm: sinus rhythm     Ectopy: none     Conduction: normal     Medications  ceFAZolin (ANCEF) IVPB 2g/100 mL premix (has no administration in time  range)  fentaNYL (SUBLIMAZE) injection 100 mcg (100 mcg Intravenous Given 12/05/22 0336)  fentaNYL (SUBLIMAZE) injection 50 mcg (50 mcg Intravenous Given 12/05/22 0357)  HYDROmorphone (DILAUDID) injection 0.5 mg (0.5 mg Intravenous Given 12/05/22 0413)  HYDROmorphone (DILAUDID) 1 MG/ML injection (0.5 mg  Given 12/05/22 0418)     IMPRESSION / MDM / ASSESSMENT AND PLAN / ED COURSE  I reviewed the triage vital signs and the nursing notes.  Differential diagnosis includes, but is not limited to, hip fracture, dislocation, syncope, seizure, hypoglycemia  {Patient presents with symptoms of an acute illness or injury that is potentially life-threatening.  Patient presents after mechanical fall with an intertrochanteric left femur fracture requiring admission for fixation.  No signs of additional trauma beyond the left hip.  No neurologic deficits.  X-ray confirms intertrochanteric fracture.  Screening preoperative blood work is generally reassuring with largely normal CBC, coagulation studies.  CKD around baseline.  I consult with orthopedics and medicine for admission  Clinical Course as of 12/05/22 0550  Tue Dec 05, 2022  0354 Reassessed before x-rays, she is fearful of pain with imaging and request more medication.  Son at the bedside. [DS]    Clinical Course User Index [DS] Delton Prairie, MD     FINAL CLINICAL IMPRESSION(S) / ED DIAGNOSES   Final diagnoses:  Fall, initial encounter  Closed left hip fracture, initial encounter [facility])     Rx / DC Orders   ED Discharge Orders     None        Note:  This document was prepared using Dragon voice recognition software and may include unintentional dictation errors.   Delton Prairie, MD 12/05/22 [phone]

## 2022-12-05 NOTE — Anesthesia Preprocedure Evaluation (Addendum)
Anesthesia Evaluation  Patient identified by MRN, date of birth, ID band Patient awake    Reviewed: Allergy & Precautions, NPO status , Patient's Chart, lab work & pertinent test results  History of Anesthesia Complications Negative for: history of anesthetic complications  Airway Mallampati: IV   Neck ROM: Full    Dental no notable dental hx.    Pulmonary sleep apnea and Continuous Positive Airway Pressure Ventilation  Sarcoidosis causing chronic cough   Pulmonary exam normal breath sounds clear to auscultation       Cardiovascular hypertension, Normal cardiovascular exam Rhythm:Regular Rate:Normal  ECG 12/05/22: Sinus rhythm Nonspecific intraventricular conduction delay   Neuro/Psych  Headaches PSYCHIATRIC DISORDERS  Depression    HOH    GI/Hepatic ,GERD  ,,S/p gastric bypass   Endo/Other  diabetes, Type 2Hypothyroidism  Obesity   Renal/GU negative Renal ROS     Musculoskeletal   Abdominal   Peds  Hematology negative hematology ROS (+)   Anesthesia Other Findings   Reproductive/Obstetrics                             Anesthesia Physical Anesthesia Plan  ASA: 2  Anesthesia Plan: General   Post-op Pain Management:    Induction: Intravenous  PONV Risk Score and Plan: 3 and Ondansetron, Dexamethasone and Treatment may vary due to age or medical condition  Airway Management Planned: Oral ETT  Additional Equipment:   Intra-op Plan:   Post-operative Plan: Extubation in OR  Informed Consent: I have reviewed the patients History and Physical, chart, labs and discussed the procedure including the risks, benefits and alternatives for the proposed anesthesia with the patient or authorized representative who has indicated his/her understanding and acceptance.     Dental advisory given  Plan Discussed with: CRNA  Anesthesia Plan Comments: (Patient consented for risks of anesthesia  including but not limited to:  - adverse reactions to medications - damage to eyes, teeth, lips or other oral mucosa - nerve damage due to positioning  - sore throat or hoarseness - damage to heart, brain, nerves, lungs, other parts of body or loss of life  Informed patient about role of CRNA in peri- and intra-operative care.  Patient voiced understanding.)        Anesthesia Quick Evaluation

## 2022-12-05 NOTE — Op Note (Signed)
12/05/2022  2:50 PM  Patient:   Kaitlin Kelley  Pre-Op Diagnosis:   Closed comminuted displaced intertrochanteric fracture, left hip.  Post-Op Diagnosis:   Same  Procedure:   Reduction and internal fixation of displaced intertrochanteric left hip fracture with Biomet Affixis TFN nail.  Surgeon:   Maryagnes Amos, MD  Assistant:   None  Anesthesia:   GET  Findings:   As above  Complications:   None  EBL:   75 cc  Fluids:   600 cc crystalloid  UOP:   None  TT:   None  Drains:   None  Closure:   Staples  Implants:   Biomet Affixis 11 x 380 mm TFN with a 105 mm lag screw and two distal interlocking screws  Brief Clinical Note:   The patient is a 72 year old female who sustained the above-noted injury last evening when she apparently lost her balance and slumped to the floor secondary to an apparent hypoglycemic episode, injuring her left hip. The patient was brought to emergency room where x-rays demonstrated the above-noted injury. The patient has been cleared medically and presents at this time for reduction and internal fixation of the displaced intertrochanteric left hip fracture.  Procedure:   The patient was brought into the operating room. After adequate general endotracheal intubation and anesthesia was obtained, the patient was lain in the supine position on the fracture table. The uninjured leg was placed in a flexed and abducted position while the injured lower extremity was placed in longitudinal traction. The fracture was reduced using longitudinal traction and internal rotation. The adequacy of reduction was verified fluoroscopically in AP and lateral projections and found to be near anatomic. The lateral aspects of the left hip and thigh were prepped with ChloraPrep solution before being draped sterilely. Preoperative antibiotics were administered. A timeout was performed to verify the appropriate surgical site.   The greater trochanter was identified  fluoroscopically and an approximately 6-7 cm incision made about 2-3 fingerbreadths above the tip of the greater trochanter. The incision was carried down through the subcutaneous tissues to expose the gluteal fascia. This was split the length of the incision, providing access to the tip of the trochanter. Under fluoroscopic guidance, a guidewire was drilled through the tip of the trochanter into the proximal metaphysis to the level of the lesser trochanter. After verifying its position fluoroscopically in AP and lateral projections, it was overreamed with the initial reamer to the depth of the lesser trochanter. A guidewire was passed down through the femoral canal to the supracondylar region. The adequacy of guidewire position was verified fluoroscopically in AP and lateral projections before the length of the guidewire within the canal was measured and found to be 400 mm. Therefore, a 380 mm length nail was selected. The guidewire was overreamed sequentially using the flexible reamers, beginning with a 10.5 mm reamer and progressing to a 12.5 mm reamer. This provided good cortical chatter. The 11 x 380 mm Biomet Affixis TFN rod was selected and advanced to the appropriate depth, as verified fluoroscopically.   The guide system for the lag screw was positioned and advanced through an approximately 2 cm stab incision over the lateral aspect of the proximal femur. The guidewire was drilled up through the trochanteric femoral nail and into the femoral neck to rest within 5 mm of subchondral bone. After verifying its position in the femoral neck and head in both AP and lateral projections, the guidewire was measured and found to be optimally replicated  by a 105 mm lag screw. The guidewire was overreamed to the appropriate depth before the lag screw was inserted and advanced to the appropriate depth as verified fluoroscopically in AP and lateral projections. The locking screw was advanced, then backed off a quarter  turn to set the lag screw. Again the adequacy of hardware position and fracture reduction was verified fluoroscopically in AP and lateral projections and found to be excellent.  Attention was directed distally. Using the "perfect circle" technique, the leg and fluoroscopy machine were positioned appropriately. An approximately 1.5 cm stab incision was made over the skin at the appropriate point before the drill bit was advanced through the cortex and across the static hole of the nail. The appropriate length of the screw was determined before the 50 mm distal interlocking screw was positioned, then advanced and tightened securely. (Initially, a 42 millimeter screw had been selected but this proved to be too short so it was removed and replaced with the longer screw.) Again the adequacy of screw position was verified fluoroscopically in AP and lateral projections and found to be excellent.  Given the patient's size, it was felt best to insert a second distal interlocking screw.  Therefore, again using the "perfect circle" technique, an approximately 1.5 cm stab incision was made over the skin at the appropriate point before the drill bit was advanced through the cortex and across the proximal end of the dynamic hole of the nail.  The screw was measured and found to be 48 mm.  This screw was inserted and was noted to engage the far cortex but not travel completely through the far cortex.  However, the bite was quite good so it was accepted.  The wounds were irrigated thoroughly with sterile saline solution before the abductor fascia was reapproximated using #0 Vicryl interrupted sutures. The subcutaneous tissues were closed using 2-0 Vicryl interrupted sutures. The skin was closed using staples. A total of 30 cc of 0.5% Sensorcaine with epinephrine was injected in and around all incisions. Sterile occlusive dressings were applied to all wounds before the patient was transferred back to her hospital bed. The  patient was then returned to the recovery room in satisfactory condition after tolerating the procedure well.

## 2022-12-05 NOTE — TOC Progression Note (Addendum)
Transition of Care [facility]) - Progression Note    Patient Details  Name: Kaitlin Kelley MRN: [id number] Date of Birth: Mar 03, 1951  [facility] CM/SW Contact  Marlowe Sax, RN Phone Number: 12/05/2022, 10:42 AM  Clinical Narrative:   The patient is from home, having surgery today, TOC to follow and assist with DC planning, Possibly will need STR SNF PASSR obtained [id number]   Expected Discharge Plan: Skilled Nursing Facility Barriers to Discharge: Continued Medical Work up  Expected Discharge Plan and Services   Discharge Planning Services: CM Consult   Living arrangements for the past 2 months: Single Family Home                 DME Arranged: N/A DME Agency: NA       HH Arranged: NA           Social Determinants of Health (SDOH) Interventions SDOH Screenings   Depression (PHQ2-9): High Risk (10/20/2022)  Tobacco Use: Low Risk  (12/05/2022)    Readmission Risk Interventions     No data to display

## 2022-12-05 NOTE — Assessment & Plan Note (Signed)
Creatinine 1.3 today with GFR in the 40s Appears to be near baseline Will otherwise monitor Hold nephrotoxic agents Follow

## 2022-12-06 ENCOUNTER — Other Ambulatory Visit: Payer: Self-pay

## 2022-12-06 ENCOUNTER — Encounter: Payer: Self-pay | Admitting: Surgery

## 2022-12-06 ENCOUNTER — Inpatient Hospital Stay: Payer: Medicare Other

## 2022-12-06 DIAGNOSIS — S72002A Fracture of unspecified part of neck of left femur, initial encounter for closed fracture: Secondary | ICD-10-CM | POA: Diagnosis not present

## 2022-12-06 DIAGNOSIS — N183 Chronic kidney disease, stage 3 unspecified: Secondary | ICD-10-CM | POA: Diagnosis not present

## 2022-12-06 DIAGNOSIS — I10 Essential (primary) hypertension: Secondary | ICD-10-CM | POA: Diagnosis not present

## 2022-12-06 DIAGNOSIS — W19XXXA Unspecified fall, initial encounter: Secondary | ICD-10-CM | POA: Diagnosis not present

## 2022-12-06 LAB — COMPREHENSIVE METABOLIC PANEL
ALT: 12 U/L (ref 0–44)
AST: 21 U/L (ref 15–41)
Albumin: 2.8 g/dL — ABNORMAL LOW (ref 3.5–5.0)
Alkaline Phosphatase: 52 U/L (ref 38–126)
Anion gap: 10 (ref 5–15)
BUN: 37 mg/dL — ABNORMAL HIGH (ref 8–23)
CO2: 20 mmol/L — ABNORMAL LOW (ref 22–32)
Calcium: 8.3 mg/dL — ABNORMAL LOW (ref 8.9–10.3)
Chloride: 104 mmol/L (ref 98–111)
Creatinine, Ser: 2.15 mg/dL — ABNORMAL HIGH (ref 0.44–1.00)
GFR, Estimated: 24 mL/min — ABNORMAL LOW (ref >60)
Glucose, Bld: 168 mg/dL — ABNORMAL HIGH (ref 70–99)
Potassium: 4.6 mmol/L (ref 3.5–5.1)
Sodium: 134 mmol/L — ABNORMAL LOW (ref 135–145)
Total Bilirubin: 0.3 mg/dL (ref 0.3–1.2)
Total Protein: 5 g/dL — ABNORMAL LOW (ref 6.5–8.1)

## 2022-12-06 LAB — GLUCOSE, CAPILLARY
Glucose-Capillary: 125 mg/dL — ABNORMAL HIGH (ref 70–99)
Glucose-Capillary: 135 mg/dL — ABNORMAL HIGH (ref 70–99)
Glucose-Capillary: 143 mg/dL — ABNORMAL HIGH (ref 70–99)
Glucose-Capillary: 179 mg/dL — ABNORMAL HIGH (ref 70–99)
Glucose-Capillary: 265 mg/dL — ABNORMAL HIGH (ref 70–99)
Glucose-Capillary: 273 mg/dL — ABNORMAL HIGH (ref 70–99)

## 2022-12-06 LAB — FOLATE: Folate: 12.3 ng/mL (ref >5.9)

## 2022-12-06 LAB — VITAMIN B12: Vitamin B-12: 675 pg/mL (ref 180–914)

## 2022-12-06 LAB — CBC
HCT: 27.5 % — ABNORMAL LOW (ref 36.0–46.0)
Hemoglobin: 9.2 g/dL — ABNORMAL LOW (ref 12.0–15.0)
MCH: 35.5 pg — ABNORMAL HIGH (ref 26.0–34.0)
MCHC: 33.5 g/dL (ref 30.0–36.0)
MCV: 106.2 fL — ABNORMAL HIGH (ref 80.0–100.0)
Platelets: 280 10*3/uL (ref 150–400)
RBC: 2.59 MIL/uL — ABNORMAL LOW (ref 3.87–5.11)
RDW: 12.7 % (ref 11.5–15.5)
WBC: 8.6 10*3/uL (ref 4.0–10.5)
nRBC: 0 % (ref 0.0–0.2)

## 2022-12-06 LAB — CREATININE, URINE, RANDOM: Creatinine, Urine: 180 mg/dL

## 2022-12-06 LAB — SODIUM, URINE, RANDOM: Sodium, Ur: <10 mmol/L

## 2022-12-06 MED ORDER — INSULIN GLARGINE-YFGN 100 UNIT/ML ~~LOC~~ SOLN
10.0000 [IU] | Freq: Every day | SUBCUTANEOUS | Status: DC
  Administered 2022-12-06 – 2022-12-10 (×5): 10 [IU] via SUBCUTANEOUS
  Filled 2022-12-06 (×6): qty 0.1

## 2022-12-06 MED ORDER — INSULIN ASPART 100 UNIT/ML IJ SOLN
4.0000 [IU] | Freq: Three times a day (TID) | INTRAMUSCULAR | Status: DC
  Administered 2022-12-06 – 2022-12-11 (×16): 4 [IU] via SUBCUTANEOUS
  Filled 2022-12-06 (×16): qty 1

## 2022-12-06 MED ORDER — SODIUM CHLORIDE 0.9 % IV SOLN: INTRAVENOUS | Status: AC

## 2022-12-06 MED ORDER — INSULIN ASPART 100 UNIT/ML IJ SOLN
0.0000 [IU] | Freq: Three times a day (TID) | INTRAMUSCULAR | Status: DC
  Administered 2022-12-06: 8 [IU] via SUBCUTANEOUS
  Administered 2022-12-06: 2 [IU] via SUBCUTANEOUS
  Administered 2022-12-07: 5 [IU] via SUBCUTANEOUS
  Administered 2022-12-07: 3 [IU] via SUBCUTANEOUS
  Administered 2022-12-07: 5 [IU] via SUBCUTANEOUS
  Administered 2022-12-08: 2 [IU] via SUBCUTANEOUS
  Administered 2022-12-08 – 2022-12-09 (×5): 3 [IU] via SUBCUTANEOUS
  Administered 2022-12-10 (×2): 2 [IU] via SUBCUTANEOUS
  Administered 2022-12-10: 8 [IU] via SUBCUTANEOUS
  Administered 2022-12-11: 5 [IU] via SUBCUTANEOUS
  Administered 2022-12-11: 3 [IU] via SUBCUTANEOUS
  Filled 2022-12-06 (×16): qty 1

## 2022-12-06 MED ORDER — GABAPENTIN 300 MG PO CAPS
300.0000 mg | ORAL_CAPSULE | Freq: Two times a day (BID) | ORAL | Status: DC
  Administered 2022-12-06 – 2022-12-11 (×10): 300 mg via ORAL
  Filled 2022-12-06 (×10): qty 1

## 2022-12-06 MED ORDER — HEPARIN SODIUM (PORCINE) 5000 UNIT/ML IJ SOLN
5000.0000 [IU] | Freq: Three times a day (TID) | INTRAMUSCULAR | Status: DC
  Administered 2022-12-07 – 2022-12-09 (×8): 5000 [IU] via SUBCUTANEOUS
  Filled 2022-12-06 (×8): qty 1

## 2022-12-06 NOTE — Anesthesia Postprocedure Evaluation (Signed)
Anesthesia Post Note  Patient: Kaitlin Kelley  Procedure(s) Performed: INTRAMEDULLARY (IM) NAIL INTERTROCHANTERIC (Left: Hip)  Patient location during evaluation: PACU Anesthesia Type: General Level of consciousness: awake and alert, oriented and patient cooperative Pain management: pain level controlled Vital Signs Assessment: post-procedure vital signs reviewed and stable Respiratory status: spontaneous breathing, nonlabored ventilation and respiratory function stable Cardiovascular status: blood pressure returned to baseline and stable Postop Assessment: adequate PO intake Anesthetic complications: no   No notable events documented.   Last Vitals:  Vitals:   12/06/22 0000 12/06/22 0425  BP: (!) 112/56 (!) 110/52  Pulse: 75 70  Resp: 16 18  Temp: 36.8 C 36.9 C  SpO2: 92% 95%    Last Pain:  Vitals:   12/06/22 0000  TempSrc: Axillary  PainSc:                  Reed Breech

## 2022-12-06 NOTE — Evaluation (Signed)
Physical Therapy Evaluation Patient Details Name: Kaitlin Kelley MRN: [id number] DOB: 1951-07-05 Today's Date: 12/06/2022  History of Present Illness  Kaitlin Kelley is a 71yoF who comes to [facility] on 12/05/22 after a fall to ground at home related to a hypoglycemic event. PMH: DM, sarcoidosis. Workup revealing of left hip intertrochanteric fracture. Pt went for Left femur ORIF IM nail on 5/28 c Dr Joice Lofts. Pt has WBAT orders from ortho team.  Clinical Impression  Per chart, pt remains hypertensive, pressures in supine improved by arrival. Pt c/o persistent sleepiness, also struggling with getting her words together in expressive language during our session, only a few instances. Orthos assessed in supine and sitting, immediate drop in pressure, but symptoms are what lead to return to supine- pt clearly more confused and less alert. Back in supine, mentation recovers. Pt educated on HEP  with generally good tolerance from a pain standpoint. WIll continue to follow.   Orthostatic VS for the past 24 hrs:  BP- Lying Pulse- Lying BP- Sitting Pulse- Sitting  12/06/22 1549 113/44 72 (!) 81/67 105         Recommendations for follow up therapy are one component of a multi-disciplinary discharge planning process, led by the attending physician.  Recommendations may be updated based on patient status, additional functional criteria and insurance authorization.  Follow Up Recommendations       Assistance Recommended at Discharge Intermittent Supervision/Assistance  Patient can return home with the following  A lot of help with walking and/or transfers;Assistance with cooking/housework;Help with stairs or ramp for entrance    Equipment Recommendations Rolling walker (2 wheels);BSC/3in1  Recommendations for Other Services       Functional Status Assessment Patient has had a recent decline in their functional status and demonstrates the ability to make significant improvements in function in a  reasonable and predictable amount of time.     Precautions / Restrictions Precautions Precautions: Fall Restrictions LLE Weight Bearing: Weight bearing as tolerated      Mobility  Bed Mobility Overal bed mobility: Needs Assistance Bed Mobility: Supine to Sit     Supine to sit: Mod assist     General bed mobility comments: minA of LLE, MinA of trunk with RUE pull to sit    Transfers                   General transfer comment: deferred this time; pt orthostatic with OT and presyncopal, then still orthostatic and dizzy at EOB    Ambulation/Gait                  Stairs            Wheelchair Mobility    Modified Rankin (Stroke Patients Only)       Balance                                             Pertinent Vitals/Pain Pain Assessment Pain Assessment: 0-10 Pain Score: 5  Pain Location: L hip Pain Descriptors / Indicators: Dull, Discomfort Pain Intervention(s): Limited activity within patient's tolerance, Monitored during session, Premedicated before session, Repositioned    Home Living Family/patient expects to be discharged to:: Private residence Living Arrangements: Children (son) Available Help at Discharge: Family;Available 24 hours/day (son works from home) Type of Home: House Home Access: Stairs to enter   [employer] of Steps: threshold step  Home Layout: One level Home Equipment: Agricultural consultant (2 wheels);Shower seat - built in      Prior Function Prior Level of Function : Independent/Modified Independent             Mobility Comments: Chronic sciatic pain limited much time on feet, used [employer] at grocery, no device in home typically. Pt drives locally as desired, son helps with longer distance transportation.       Hand Dominance   Dominant Hand: Right    Extremity/Trunk Assessment                Communication   Communication: Expressive difficulties (some word finding  issues during my visit)  Cognition                                                General Comments      Exercises General Exercises - Lower Extremity Short Arc Quad: AROM, AAROM, Left, 15 reps, Supine Heel Slides: AAROM, Left, 10 reps, Supine Hip ABduction/ADduction: AAROM, Left, 10 reps, Supine   Assessment/Plan    PT Assessment Patient needs continued PT services  PT Problem List Decreased strength;Decreased range of motion;Decreased activity tolerance;Decreased balance;Decreased mobility;Decreased knowledge of use of DME;Decreased safety awareness;Decreased knowledge of precautions       PT Treatment Interventions DME instruction;Gait training;Stair training;Functional mobility training;Therapeutic activities;Therapeutic exercise;Balance training;Neuromuscular re-education;Cognitive remediation;Patient/family education    PT Goals (Current goals can be found in the Care Plan section)  Acute Rehab PT Goals Patient Stated Goal: regain strength and mobility PT Goal Formulation: With patient Time For Goal Achievement: 12/20/22 Potential to Achieve Goals: Good    Frequency Min 4X/week     Co-evaluation               AM-PAC PT "6 Clicks" Mobility  Outcome Measure Help needed turning from your back to your side while in a flat bed without using bedrails?: A Lot Help needed moving from lying on your back to sitting on the side of a flat bed without using bedrails?: A Lot Help needed moving to and from a bed to a chair (including a wheelchair)?: Total Help needed standing up from a chair using your arms (e.g., wheelchair or bedside chair)?: Total Help needed to walk in hospital room?: Total Help needed climbing 3-5 steps with a railing? : Total 6 Click Score: 8    End of Session   Activity Tolerance: Patient tolerated treatment well;Treatment limited secondary to medical complications (Comment) (orthostatic hypotensive) Patient left: in bed;with  family/visitor present;with call bell/phone within reach Nurse Communication: Mobility status PT Visit Diagnosis: Other abnormalities of gait and mobility (R26.89);Muscle weakness (generalized) (M62.81);Difficulty in walking, not elsewhere classified (R26.2)    Time: 1537-1610 PT Time Calculation (min) (ACUTE ONLY): 33 min   Charges:   PT Evaluation $PT Eval Moderate Complexity: 1 Mod PT Treatments $Therapeutic Exercise: 8-22 mins       4:51 PM, 12/06/22 Rosamaria Lints, PT, DPT Physical Therapist - [facility]  [phone] (ASCOM)   Markell Schrier C 12/06/2022, 4:49 PM

## 2022-12-06 NOTE — Progress Notes (Addendum)
PROGRESS NOTE    Kaitlin Kelley   ZOX:[id number] DOB: 06-22-1951  DOA: 12/05/2022 Date of Service: 12/06/22 PCP: Elissa Hefty, MD     Brief Narrative / Hospital Course:  Kaitlin Kelley is a 72 y.o. female with medical history significant of hypertension, hypothyroidism, type 2 diabetes, sarcoidosis, Rheumatoid arthritis presenting from home to ED 12/05/22 with fall and left hip fracture. Patient reports feeling her sugar was low overnight, attempted to go to the fridge to get something to eat.  Developed generalized weakness with subsequent fall.   05/28: Left hip plain films with acute intertrochanteric left femur fracture with varus deformity. TO OR for reduction and internal fixation of displaced intertrochanteric left hip fracture with Bi[employer] TFN nail w/ Dr Joice Lofts. 05/29: worsening renal function. Suspect hypovolemia/hypotension related, FeNa = prerenal. IV fluids through today, holding BP meds and other nephrotoxic, follow BMP  Consultants:  orthopedics  Procedures: 12/05/2022 L intramedullary intertrochanteric nail fixation of displaced intertrochanteric left hip fracture         ASSESSMENT & PLAN:   Principal Problem:   Hip fracture (HCC) Active Problems:   Hypertension   Type 2 diabetes mellitus (HCC)   Sarcoidosis   Hypothyroidism   Fall at home, initial encounter   CKD (chronic kidney disease) stage 3, GFR 30-59 ml/min (HCC)   Hip fracture (HCC) Fall at home, initial encounter intertrochanteric left femur fracture with varus deformity status post mechanical fall POD1 s/p nail repair  Advance diet Up with therapy PT/OT recs pending   AKI (acute kidney injury) (HCC) CKD (chronic kidney disease) stage 3a, GFR 30-59 ml/min (HCC) Creatinine 1.3 initially with GFR 45, worsening today to Cr 2.15 and GFR 24 FeNa likely prerenal  Lovenox --> heparin Holding ARB Reducing gabapentin Holding NSAID Increase IV fluids - NS 1500 mL today   I&O Follow BMP  Confusion Per son, new Suspect hospital delirium, medication effect but no CT head in ED w/ fall CT head  Monitor   Anemia Post-op but baseline macrocytosis  Monitor CBC Anemia panel   Hypertension BP stable Titrate home regimen Holding nephrotoxins in setting of AKI  Type 2 diabetes mellitus (HCC) SSI ac hs + basal  A1c  Sarcoidosis Appears stable Chest x-ray within normal limits Continue home regimen   Hypothyroidism Continue Synthroid    DVT prophylaxis: heparin Pertinent IV fluids/nutrition: NS 125 mL/h x12h today  Central lines / invasive devices: none  Code Status: FULL CODE ACP documentation reviewed: 12/06/22, none on file VYNCA   Current Admission Status: inpatient  TOC needs / Dispo plan: pending PT/OT recs may need SNF/HH Barriers to discharge / significant pending items: AKI, PT/OT eval              Subjective / Brief ROS:  Patient reports feeling okay today Denies CP/SOB.  Pain controlled.  Denies new weakness.  Tolerating diet.  Reports no concerns w/ urination/defecation.   Family Communication: son at bedside on rounds     Objective Findings:  Vitals:   12/06/22 0753 12/06/22 1525 12/06/22 1531 12/06/22 1549  BP: (!) 108/57 126/77 (!) 95/53   Pulse: 66 82 79   Resp: 16 20 20    Temp: 98.3 F (36.8 C) 98.5 F (36.9 C) 98.2 F (36.8 C)   TempSrc:      SpO2: 95% 92% 96% 92%  Weight:      Height:        Intake/Output Summary (Last 24 hours) at 12/06/2022 1726 Last data filed at 12/06/2022  0900 Gross per 24 hour  Intake 1400 ml  Output 250 ml  Net 1150 ml   Filed Weights   12/05/22 0334 12/05/22 0655  Weight: 102.1 kg 100.7 kg    Examination:  Physical Exam Constitutional:      General: She is not in acute distress.    Appearance: She is obese.  Cardiovascular:     Rate and Rhythm: Normal rate and regular rhythm.     Heart sounds: Normal heart sounds.  Pulmonary:     Effort: Pulmonary  effort is normal.     Breath sounds: Normal breath sounds.  Abdominal:     Palpations: Abdomen is soft.     Tenderness: There is no abdominal tenderness.  Musculoskeletal:     Right lower leg: No edema.     Left lower leg: No edema.  Skin:    General: Skin is warm and dry.  Neurological:     General: No focal deficit present.     Mental Status: She is alert and oriented to person, place, and time.  Psychiatric:        Mood and Affect: Mood normal.        Behavior: Behavior normal.          Scheduled Medications:   calcium-vitamin D  1 tablet Oral Q breakfast   docusate sodium  100 mg Oral BID   DULoxetine  60 mg Oral BID   gabapentin  300 mg Oral BID   [START ON 12/07/2022] heparin injection (subcutaneous)  5,000 Units Subcutaneous Q8H   insulin aspart  0-15 Units Subcutaneous TID WC   insulin aspart  4 Units Subcutaneous TID WC   insulin glargine-yfgn  10 Units Subcutaneous QHS   mirtazapine  15 mg Oral QHS   rosuvastatin  40 mg Oral Daily   sulfaSALAzine  1,000 mg Oral BID    Continuous Infusions:  sodium chloride 125 mL/hr at 12/06/22 1002    PRN Medications:  acetaminophen, bisacodyl, diphenhydrAMINE, HYDROcodone-acetaminophen, magnesium hydroxide, metoCLOPramide **OR** metoCLOPramide (REGLAN) injection, ondansetron **OR** ondansetron (ZOFRAN) IV, sodium phosphate  Antimicrobials from admission:  Anti-infectives (From admission, onward)    Start     Dose/Rate Route Frequency Ordered Stop   12/05/22 2000  ceFAZolin (ANCEF) IVPB 2g/100 mL premix        2 g 200 mL/hr over 30 Minutes Intravenous Every 8 hours 12/05/22 1601 12/06/22 0448   12/05/22 0512  ceFAZolin (ANCEF) IVPB 2g/100 mL premix        2 g 200 mL/hr over 30 Minutes Intravenous 30 min pre-op 12/05/22 1610 12/05/22 1226           Data Reviewed:  I have personally reviewed the following...  CBC: Recent Labs  Lab 12/05/22 0342 12/06/22 0713  WBC 6.3 8.6  NEUTROABS 4.0  --   HGB 12.6  9.2*  HCT 37.1 27.5*  MCV 103.1* 106.2*  PLT 302 280   Basic Metabolic Panel: Recent Labs  Lab 12/05/22 0342 12/06/22 0551  NA 137 134*  K 3.5 4.6  CL 102 104  CO2 20* 20*  GLUCOSE 179* 168*  BUN 22 37*  CREATININE 1.27* 2.15*  CALCIUM 8.9 8.3*   GFR: Estimated Creatinine Clearance: 27.9 mL/min (A) (by C-G formula based on SCr of 2.15 mg/dL (H)). Liver Function Tests: Recent Labs  Lab 12/06/22 0551  AST 21  ALT 12  ALKPHOS 52  BILITOT 0.3  PROT 5.0*  ALBUMIN 2.8*   No results for input(s): "LIPASE", "AMYLASE" in the  last 168 hours. No results for input(s): "AMMONIA" in the last 168 hours. Coagulation Profile: Recent Labs  Lab 12/05/22 0342  INR 1.0   Cardiac Enzymes: No results for input(s): "CKTOTAL", "CKMB", "CKMBINDEX", "TROPONINI" in the last 168 hours. BNP (last 3 results) No results for input(s): "PROBNP" in the last 8760 hours. HbA1C: Recent Labs    12/05/22 0337  HGBA1C 9.4*   CBG: Recent Labs  Lab 12/06/22 0003 12/06/22 0422 12/06/22 0800 12/06/22 1214 12/06/22 1632  GLUCAP 273* 179* 125* 265* 135*   Lipid Profile: No results for input(s): "CHOL", "HDL", "LDLCALC", "TRIG", "CHOLHDL", "LDLDIRECT" in the last 72 hours. Thyroid Function Tests: No results for input(s): "TSH", "T4TOTAL", "FREET4", "T3FREE", "THYROIDAB" in the last 72 hours. Anemia Panel: Recent Labs    12/06/22 0551  FOLATE 12.3   Most Recent Urinalysis On File:     Component Value Date/Time   COLORURINE YELLOW 02/24/2018 1435   APPEARANCEUR CLOUDY (A) 02/24/2018 1435   LABSPEC 1.020 02/24/2018 1435   PHURINE 5.0 02/24/2018 1435   GLUCOSEU 500 (A) 02/24/2018 1435   HGBUR TRACE (A) 02/24/2018 1435   BILIRUBINUR SMALL (A) 02/24/2018 1435   KETONESUR TRACE (A) 02/24/2018 1435   PROTEINUR 100 (A) 02/24/2018 1435   NITRITE NEGATIVE 02/24/2018 1435   LEUKOCYTESUR NEGATIVE 02/24/2018 1435   Sepsis Labs: @LABRCNTIP (procalcitonin:4,lacticidven:4) Microbiology: No  results found for this or any previous visit (from the past 240 hour(s)).    Radiology Studies last 3 days: DG HIP UNILAT WITH PELVIS 2-3 VIEWS LEFT  Result Date: 12/05/2022 CLINICAL DATA:  Elective surgery. EXAM: DG HIP (WITH OR WITHOUT PELVIS) 2-3V LEFT COMPARISON:  Radiograph earlier today FINDINGS: Nine fluoroscopic spot views of the pelvis and left hip obtained in the operating room. Sequential images intramedullary nail with trans trochanteric and distal locking screw fixation of proximal femur fracture. Fluoroscopy time 1 minute 44 seconds. Dose 45.6 mGy. IMPRESSION: Intraoperative fluoroscopy during proximal femur fracture ORIF. Electronically Signed   By: Narda Rutherford M.D.   On: 12/05/2022 14:27   DG C-Arm 1-60 Min-No Report  Result Date: 12/05/2022 Fluoroscopy was utilized by the requesting physician.  No radiographic interpretation.   DG C-Arm 1-60 Min-No Report  Result Date: 12/05/2022 Fluoroscopy was utilized by the requesting physician.  No radiographic interpretation.   DG Chest Portable 1 View  Result Date: 12/05/2022 CLINICAL DATA:  Fall injury. EXAM: PORTABLE CHEST 1 VIEW COMPARISON:  AP Lat 11/08/2022 FINDINGS: There is mild cardiomegaly. There is aortic tortuosity and mild ectasia with patchy calcifications and stable mediastinum. No vascular congestion is seen. The lungs are clear with asymmetric chronic elevation of the right diaphragm. No pleural effusion is evident. There is osteopenia and bridging enthesopathy of the thoracic spine. Multiple overlying telemetry leads. IMPRESSION: No acute radiographic chest findings.  Chronic findings as above. Electronically Signed   By: Almira Bar M.D.   On: 12/05/2022 04:50   DG HIP UNILAT WITH PELVIS 2-3 VIEWS LEFT  Result Date: 12/05/2022 CLINICAL DATA:  Hip fracture EXAM: DG HIP (WITH OR WITHOUT PELVIS) 2-3V LEFT COMPARISON:  None Available. FINDINGS: Acute intertrochanteric left femur fracture with varus angulation. No  evidence of pelvic ring fracture or diastasis. Subjective osteopenia. IMPRESSION: Acute intertrochanteric left femur fracture with varus deformity. Electronically Signed   By: Tiburcio Pea M.D.   On: 12/05/2022 04:44             LOS: 1 day       Sunnie Nielsen, DO [facility] 12/06/2022, 5:26 PM  Dictation software may have been used to generate the above note. Typos may occur and escape review in typed/dictated notes. Please contact Dr Lyn Hollingshead directly for clarity if needed.  Staff may message me via secure chat in Epic  but this may not receive an immediate response,  please page me for urgent matters!  If 7PM-7AM, please contact night coverage [email or web address]

## 2022-12-06 NOTE — Progress Notes (Signed)
  Subjective: 1 Day Post-Op Procedure(s) (LRB): INTRAMEDULLARY (IM) NAIL INTERTROCHANTERIC (Left) Patient reports pain as mild.   Patient is well, and has had no acute complaints or problems PT and care management to assist with discharge planning. Negative for chest pain and shortness of breath Fever: no Gastrointestinal:Negative for nausea and vomiting Reports she is urinating well, not passing much gas at this time.  Objective: Vital signs in last 24 hours: Temp:  [98.1 F (36.7 C)-99 F (37.2 C)] 98.4 F (36.9 C) (05/29 0425) Pulse Rate:  [70-84] 70 (05/29 0425) Resp:  [12-18] 18 (05/29 0425) BP: (110-167)/(52-125) 110/52 (05/29 0425) SpO2:  [92 %-99 %] 95 % (05/29 0425)  Intake/Output from previous day:  Intake/Output Summary (Last 24 hours) at 12/06/2022 0738 Last data filed at 12/06/2022 0719 Gross per 24 hour  Intake 2280 ml  Output 325 ml  Net 1955 ml    Intake/Output this shift: Total I/O In: -  Out: 250 [Urine:250]  Labs: Recent Labs    12/05/22 0342  HGB 12.6   Recent Labs    12/05/22 0342  WBC 6.3  RBC 3.60*  HCT 37.1  PLT 302   Recent Labs    12/05/22 0342 12/06/22 0551  NA 137 134*  K 3.5 4.6  CL 102 104  CO2 20* 20*  BUN 22 37*  CREATININE 1.27* 2.15*  GLUCOSE 179* 168*  CALCIUM 8.9 8.3*   Recent Labs    12/05/22 0342  INR 1.0     EXAM General - Patient is Alert, Appropriate, and Oriented Extremity - ABD soft Neurovascular intact Dorsiflexion/Plantar flexion intact Incision: dressing C/D/I No cellulitis present Compartment soft Dressing/Incision - clean, dry, no drainage, swelling noted to the left thigh but compartment is soft. Motor Function - intact, moving foot and toes well on exam.  Abdomen soft with intact bowel sounds.  Past Medical History:  Diagnosis Date   Arthritis    Chronic cough    Depression    Diabetes mellitus without complication (HCC)    Edema    LEGS/FEET   GERD (gastroesophageal reflux  disease)    NO MEDS   Headache    H/O MIGRAINES   HOH (hard of hearing)    Hypertension    Hypothyroidism    IBS (irritable bowel syndrome)    Sleep apnea    CPAP    Assessment/Plan: 1 Day Post-Op Procedure(s) (LRB): INTRAMEDULLARY (IM) NAIL INTERTROCHANTERIC (Left) Principal Problem:   Hip fracture (HCC) Active Problems:   Hypertension   Type 2 diabetes mellitus (HCC)   Sarcoidosis   Hypothyroidism   Fall at home, initial encounter   CKD (chronic kidney disease) stage 3, GFR 30-59 ml/min (HCC)  Estimated body mass index is 35.83 kg/m as calculated from the following:   Height as of this encounter: 5\' 6"  (1.676 m).   Weight as of this encounter: 100.7 kg. Advance diet Up with therapy D/C IV fluids when tolerating po intake.  Vitals reviewed this AM, no recent fever. Reports she is urinating well, continue to work on BM. Patient lives at home with son, up with therapy today. May need SNF upon discharge pending on performance with PT.  DVT Prophylaxis - Lovenox, TED hose, and SCDs Weight-Bearing as tolerated to left leg  J. Horris Latino, PA-C [facility] 12/06/2022, 7:38 AM

## 2022-12-06 NOTE — Evaluation (Signed)
Occupational Therapy Evaluation Patient Details Name: Kaitlin Kelley MRN: [id number] DOB: [birth year] Today's Date: 12/06/2022   History of Present Illness Kaitlin Kelley is a 71yoF who [facility] on [facility]/05/22 after a fall to ground at home related to a hypoglycemic event. PMH: DM, sarcoidosis. Workup revealing of left hip intertrochanteric fracture. Pt went for Left femur ORIF IM nail on 5/28 c Dr Joice Lofts   Clinical Impression   Ms Zobrist was seen for OT evaluation this date. Prior to hospital admission, pt was MO DI using RW as needed. Pt lives with son who cannot physically assist pt. Upon arrival pt receiving bed change from NT after spilling her coffee. MIN A exit bed, assist for LLE mgmt. Reports 2/10 pain in L hip. MIN A + RW for ADL t/f ~20 ft.  Pt currently requires MIN A don shorts and underwear, assist for pulling up in standing.   In standing pt reports dizziness, resolved in sitting. Attempted orthostatics however pt noted to have presyncopal episode (lightheaded, dizzy, decreased responsiveness) resolved once reclined in chair. BP during standing attempt 92/41, HR 106 - MD and RN notified. Upon hospital discharge, recommend follow up OT.   Recommendations for follow up therapy are one component of a multi-disciplinary discharge planning process, led by the attending physician.  Recommendations may be updated based on patient status, additional functional criteria and insurance authorization.   Assistance Recommended at Discharge Frequent or constant Supervision/Assistance  Patient can return home with the following A lot of help with walking and/or transfers;A lot of help with bathing/dressing/bathroom;Help with stairs or ramp for entrance    Functional Status Assessment  Patient has had a recent decline in their functional status and demonstrates the ability to make significant improvements in function in a reasonable and predictable amount of time.  Equipment Recommendations   BSC/3in1;Tub/shower seat    Recommendations for Other Services       Precautions / Restrictions Precautions Precautions: Fall Restrictions Weight Bearing Restrictions: Yes LLE Weight Bearing: Weight bearing as tolerated      Mobility Bed Mobility Overal bed mobility: Needs Assistance Bed Mobility: Supine to Sit     Supine to sit: Min assist     General bed mobility comments: assist for LLE mgmt    Transfers Overall transfer level: Needs assistance Equipment used: Rolling walker (2 wheels) Transfers: Sit to/from Stand Sit to Stand: Min guard                  Balance Overall balance assessment: Needs assistance Sitting-balance support: No upper extremity supported, Feet supported Sitting balance-Leahy Scale: Good     Standing balance support: Bilateral upper extremity supported Standing balance-Leahy Scale: Fair Standing balance comment: decreasing to poor with syncopoal episode                           ADL either performed or assessed with clinical judgement   ADL Overall ADL's : Needs assistance/impaired                                       General ADL Comments: MIN A don shorts and underwear, assist for pulling up in standing. MIN A + RW simulated BSC t/f.      Pertinent Vitals/Pain Pain Assessment Pain Assessment: 0-10 Pain Score: 2  Pain Location: L hip Pain Descriptors / Indicators: Dull, Discomfort Pain Intervention(s): Limited  activity within patient's tolerance, Premedicated before session, Repositioned     Hand Dominance Right   Extremity/Trunk Assessment Upper Extremity Assessment Upper Extremity Assessment: Overall WFL for tasks assessed   Lower Extremity Assessment Lower Extremity Assessment: Generalized weakness       Communication Communication Communication: No difficulties   Cognition Arousal/Alertness: Awake/alert Behavior During Therapy: WFL for tasks assessed/performed Overall Cognitive  Status: Within Functional Limits for tasks assessed                                 General Comments:  (decreased safety awareness)                Home Living Family/patient expects to be discharged to:: Private residence Living Arrangements: Children Available Help at Discharge: Family Type of Home: House Home Access: Stairs to enter [employer] of Steps: threshold step   Home Layout: One level     Bathroom Shower/Tub: Producer, television/film/video: Handicapped height     Home Equipment: Agricultural consultant (2 wheels);Shower seat - built in          Prior Functioning/Environment Prior Level of Function : Independent/Modified Independent                        OT Problem List: Decreased strength;Decreased range of motion;Decreased activity tolerance;Impaired balance (sitting and/or standing)      OT Treatment/Interventions: Therapeutic exercise;Self-care/ADL training;Energy conservation;DME and/or AE instruction;Therapeutic activities;Patient/family education;Balance training    OT Goals(Current goals can be found in the care plan section) Acute Rehab OT Goals Patient Stated Goal: to go home OT Goal Formulation: With patient Time For Goal Achievement: 12/20/22 Potential to Achieve Goals: Good ADL Goals Pt Will Perform Grooming: with modified independence;standing Pt Will Perform Lower Body Dressing: with modified independence;sit to/from stand Pt Will Transfer to Toilet: with modified independence;ambulating;regular height toilet  OT Frequency: Min 2X/week    Co-evaluation              AM-PAC OT "6 Clicks" Daily Activity     Outcome Measure Help from another person eating meals?: None Help from another person taking care of personal grooming?: A Little Help from another person toileting, which includes using toliet, bedpan, or urinal?: A Lot Help from another person bathing (including washing, rinsing, drying)?: A  Lot Help from another person to put on and taking off regular upper body clothing?: A Little Help from another person to put on and taking off regular lower body clothing?: A Little 6 Click Score: 17   End of Session Equipment Utilized During Treatment: Rolling walker (2 wheels) Nurse Communication: Mobility status  Activity Tolerance: Patient tolerated treatment well Patient left: in chair;with call bell/phone within reach;with chair alarm set;with family/visitor present  OT Visit Diagnosis: Unsteadiness on feet (R26.81);Repeated falls (R29.6)                Time: 1610-9604 OT Time Calculation (min): 35 min Charges:  OT General Charges $OT Visit: 1 Visit OT Evaluation $OT Eval Moderate Complexity: 1 Mod OT Treatments $Self Care/Home Management : 8-22 mins  Kathie Dike, M.S. OTR/L  12/06/22, 10:35 AM  ascom [phone]

## 2022-12-06 NOTE — Progress Notes (Signed)
PT Cancellation Note  Patient Details Name: Kaitlin Kelley MRN: [id number] DOB: 1950-10-29   Cancelled Treatment:    Reason Eval/Treat Not Completed: Medical issues which prohibited therapy (Per OT, pt orthostatic and hypotensive during assessment. Author attempted evaluation 2 hours later, Pt awake, supine, rpeorts conitnued dizzness. SpO2 89% on RA, returned to 1L/min. RN aware. Will attempt again at later date/time.)  12:37 PM, 12/06/22 Rosamaria Lints, PT, DPT Physical Therapist - [facility]  [phone] (ASCOM)    Cath[place] C 12/06/2022, 12:37 PM

## 2022-12-06 NOTE — Progress Notes (Signed)
       CROSS COVER NOTE  NAME: Kaitlin Kelley MRN: [id number] DOB : 1950/08/29    HPI/Events of Note   Report: Dayshift signout: Son is nervous about her confusion... honestly I'm not too stressed but with a fall and the ED never scanned her head I'm ordering a stat head CT      Assessment and  Interventions   Assessment: Confusion  Plan: Head CT nonacute X X

## 2022-12-06 NOTE — Hospital Course (Addendum)
Kaitlin Kelley is a 72 y.o. female with medical history significant of hypertension, hypothyroidism, type 2 diabetes, sarcoidosis, Rheumatoid arthritis presenting from home to ED 12/05/22 with fall and left hip fracture. Patient reports feeling her sugar was low overnight, attempted to go to the fridge to get something to eat.  Developed generalized weakness with subsequent fall.   05/28: Left hip plain films with acute intertrochanteric left femur fracture with varus deformity. TO OR for reduction and internal fixation of displaced intertrochanteric left hip fracture with [employer] Affixis TFN nail w/ Dr Joice Lofts. 05/29: worsening renal function. Suspect hypovolemia/hypotension related, FeNa = prerenal. IV fluids through today, holding BP meds and other nephrotoxics, follow BMP. Suspect hospital delirium, medication effect but no CT head in ED w/ fall, CT done this evening, no concerns  05/30: AKI improving, not back to baseline, conitnue IV fluids. TOC working on SNF placement.  05/31:Cr at baseline. Placement pending.   Consultants:  orthopedics  Procedures: 12/05/2022 L intramedullary intertrochanteric nail fixation of displaced intertrochanteric left hip fracture         ASSESSMENT & PLAN:   Principal Problem:   Hip fracture (HCC) Active Problems:   Hypertension   Type 2 diabetes mellitus (HCC)   Sarcoidosis   Hypothyroidism   Fall at home, initial encounter   CKD (chronic kidney disease) stage 3, GFR 30-59 ml/min (HCC)   Hip fracture (HCC) Fall at home, initial encounter intertrochanteric left femur fracture with varus deformity status post mechanical fall POD3 s/p nail repair  Advance diet Up with therapy PT/OT recs for SNF rehab, TOC following   AKI (acute kidney injury) (HCC) - improving  CKD (chronic kidney disease) stage 3a, GFR 30-59 ml/min (HCC) Creatinine 1.3 initially with GFR 45, worsening yesterday to Cr 2.15 and GFR 24 --> today Cr 1.87 and GFR 28 FeNa is  prerenal and Cr is improving w/ fluids  Lovenox --> heparin Holding ARB Reducing gabapentin Holding NSAID IV fluids - NS 1500 mL yesterday, repeat today   I&O - good UOP Follow BMP - if continuing to improve would not wait until baseline to discharge if placement becomes available   Confusion - improved Per son, new yesterday  Suspect hospital delirium, medication effect but no CT head in ED w/ fall, obtained CT 05/29 no concerns  No concerns on neuro exam  Monitor   Anemia Post-op but baseline macrocytosis  Normal B12 and folate  Monitor CBC Follow outpatient   Hypertension BP stable Titrate home regimen Holding nephrotoxins in setting of AKI  Type 2 diabetes mellitus (HCC) SSI ac hs + basal   Sarcoidosis Appears stable Chest x-ray within normal limits Continue home regimen   Hypothyroidism Continue Synthroid   Fever blister to lower lip Valtrex ordered    DVT prophylaxis: heparin Pertinent IV fluids/nutrition: NS 125 mL/h x12h today  Central lines / invasive devices: none  Code Status: FULL CODE ACP documentation reviewed: 12/06/22, none on file VYNCA   Current Admission Status: inpatient  TOC needs / Dispo plan: pending PT/OT recs may need SNF/HH Barriers to discharge / significant pending items: AKI, placement, anticipate can discharge tomorrow if renal function appropriate and placement can be arranged

## 2022-12-06 NOTE — Inpatient Diabetes Management (Addendum)
Inpatient Diabetes Program Recommendations  AACE/ADA: New Consensus Statement on Inpatient Glycemic Control   Target Ranges:  Prepandial:   less than 140 mg/dL      Peak postprandial:   less than 180 mg/dL (1-2 hours)      Critically ill patients:  140 - 180 mg/dL    Latest Reference Range & Units 12/06/22 00:03 12/06/22 04:22 12/06/22 08:00  Glucose-Capillary 70 - 99 mg/dL 960 (H) 454 (H) 098 (H)    Latest Reference Range & Units 12/05/22 08:25 12/05/22 14:51 12/05/22 16:37 12/05/22 20:09  Glucose-Capillary 70 - 99 mg/dL 119 (H) 147 (H) 829 (H) 430 (H)    Latest Reference Range & Units 12/05/22 03:37  Hemoglobin A1C 4.8 - 5.6 % 9.4 (H)   Review of Glycemic Control  Diabetes history: DM2 Outpatient Diabetes medications: Soliqua 100-33 unit-mcg/ml 24 units QHS, Humalog 3-11 units with meals (takes 4 units with meals plus correction) Current orders for Inpatient glycemic control: Glipizide 10 mg BID, Novolog 0-15 units Q4H  Inpatient Diabetes Program Recommendations:    Insulin: Please discontinue Glipizide while inpatient. Please consider ordering Semglee 10 units Q24H, changing CBGs to AC&HS, changing Novolog 0-15 units to AC&HS, and adding Novolog 4 units TID with meals for meal coverage if patient eats at least 50% of meals.  HbgA1C: A1C 9.4% on 12/05/22 indicating an average glucose of 223 mg/dl over the past 2-3 months. Prior A1C 8.6% on 09/06/22 when last seen Endocrinology.  Outpatient: At time of discharge, please consider prescribing Baqsimi (#[id number]) for treatment of severe hypoglycemia.   NOTE: Patient admitted with hip fracture after fall at home in setting of transient hypoglycemia. Patient had hip surgery on 12/05/22 and received Decadron 5 mg at 12:01 pm on 12/05/22 which is contributing to hyperglycemia. In reviewing chart, noted patient sees [facility] Endocrine and last seen Dr. Jimmye Norman on 09/06/22. Per office note on 09/06/22, patient was instructed to increase  Soliqua 24 units nightly (can titrate up to 50 units daily as needed), continue Humalog 14-16 units with meals and start dosing 6-8 units with carb-containing snack plus correction 1 unit per 30 > 150 mg/dl. Also noted that patient uses Dexcom CGM.   Addendum 12/06/22@11 :35-Spoke with patient and her son at bedside about diabetes and home regimen for diabetes control. Patient reports being followed by Dr. Jimmye Norman for diabetes management and currently taking Soliqua 100-33 unit-mcg/ml 24 units QHS and Humalog 3-11 units with meals (takes 4 units with meals plus correction) as an outpatient for diabetes control. Patient reports taking Soliqua consistently but reports she does not always take the Humalog as prescribed. Patient reports she tends to eat 1 or 2 times a day but her son tries to encourage her to at least eat a snack if she is not eating a meal.  Patient states that she uses Dexcom G7 CGM for glucose monitoring (currently has on left upper arm).  Patient states that she has hypoglycemia 2-3 times a week. Patient reports that her glucose was low at home and she waited too long before going to get something to treat hypoglycemia and she ended up falling on her way back to her bedroom.  Patient admits that her Dexcom alarm (indicating hypoglycemia) had went off for a while and she ended up shutting it off because the alarm was getting on her nerves. After a while she decided to get up and get something because she could tell her glucose was continuing to drop.  Patient admits that she tends  to delay treating hypoglycemia because she just doesn't feel like getting up or eating at the time. Patient reports that when she does treat hypoglycemia, she tends to over treat it which causes glucose to increase too much.  Discussed treating hypoglycemia with about 15 grams of carbohydrates. Encouraged patient to have something for hypoglycemia treatment at bedside at home or places she spends a lot of time at while at  home. Discussed glucagon and how it works. Would recommend patient receive Rx for Baqsimi at discharge so patient can have at bedside to treat significant hypoglycemia at home. Also encouraged patient to start using Dexcom app on her phone to log food intake, insulin taken, and any hypoglycemia (along with treatment) so her Endocrinologist can use the information to continue to make adjustments with DM medications if needed. Encouraged patient to use alarms on Dexcom, to respond to alarms from Dexcom early, and not delay in treating hypoglycemia.  Discussed A1C results (9.4% on 12/05/22 ) and explained that current A1C indicates an average glucose of 223 mg/dl over the past 2-3 months. Discussed glucose and A1C goals. Discussed importance of checking CBGs and maintaining good CBG control to prevent long-term and short-term complications. Patient has a follow up appointment with Dr. Jimmye Norman on 01/15/23. Encouraged patient to keep detailed notes on Dexcom app so Dr. Jimmye Norman can use the information to continue to make adjustments with insulin if needed. Encouraged patient to reach out to Dr. Jimmye Norman if she continues to have issues with hypoglycemia after she returns home.  Patient verbalized understanding of information discussed and reports no further questions at this time related to diabetes.  Thanks, Orlando Penner, RN, MSN, CDCES Diabetes Coordinator Inpatient Diabetes Program [phone] (Team Pager from 8am to 5pm)

## 2022-12-07 DIAGNOSIS — I10 Essential (primary) hypertension: Secondary | ICD-10-CM | POA: Diagnosis not present

## 2022-12-07 DIAGNOSIS — S72002A Fracture of unspecified part of neck of left femur, initial encounter for closed fracture: Secondary | ICD-10-CM | POA: Diagnosis not present

## 2022-12-07 DIAGNOSIS — N183 Chronic kidney disease, stage 3 unspecified: Secondary | ICD-10-CM | POA: Diagnosis not present

## 2022-12-07 DIAGNOSIS — W19XXXA Unspecified fall, initial encounter: Secondary | ICD-10-CM | POA: Diagnosis not present

## 2022-12-07 LAB — CBC
HCT: 24.4 % — ABNORMAL LOW (ref 36.0–46.0)
Hemoglobin: 8.2 g/dL — ABNORMAL LOW (ref 12.0–15.0)
MCH: 36 pg — ABNORMAL HIGH (ref 26.0–34.0)
MCHC: 33.6 g/dL (ref 30.0–36.0)
MCV: 107 fL — ABNORMAL HIGH (ref 80.0–100.0)
Platelets: 232 10*3/uL (ref 150–400)
RBC: 2.28 MIL/uL — ABNORMAL LOW (ref 3.87–5.11)
RDW: 13.3 % (ref 11.5–15.5)
WBC: 8 10*3/uL (ref 4.0–10.5)
nRBC: 0 % (ref 0.0–0.2)

## 2022-12-07 LAB — GLUCOSE, CAPILLARY
Glucose-Capillary: 202 mg/dL — ABNORMAL HIGH (ref 70–99)
Glucose-Capillary: 161 mg/dL — ABNORMAL HIGH (ref 70–99)
Glucose-Capillary: 214 mg/dL — ABNORMAL HIGH (ref 70–99)
Glucose-Capillary: 202 mg/dL — ABNORMAL HIGH (ref 70–99)
Glucose-Capillary: 149 mg/dL — ABNORMAL HIGH (ref 70–99)

## 2022-12-07 LAB — TSH: TSH: 0.744 u[IU]/mL (ref 0.350–4.500)

## 2022-12-07 LAB — BASIC METABOLIC PANEL
Anion gap: 9 (ref 5–15)
BUN: 49 mg/dL — ABNORMAL HIGH (ref 8–23)
CO2: 21 mmol/L — ABNORMAL LOW (ref 22–32)
Calcium: 7.8 mg/dL — ABNORMAL LOW (ref 8.9–10.3)
Chloride: 108 mmol/L (ref 98–111)
Creatinine, Ser: 1.87 mg/dL — ABNORMAL HIGH (ref 0.44–1.00)
GFR, Estimated: 28 mL/min — ABNORMAL LOW (ref >60)
Glucose, Bld: 186 mg/dL — ABNORMAL HIGH (ref 70–99)
Potassium: 4.1 mmol/L (ref 3.5–5.1)
Sodium: 138 mmol/L (ref 135–145)

## 2022-12-07 LAB — VITAMIN B12: Vitamin B-12: 638 pg/mL (ref 180–914)

## 2022-12-07 MED ORDER — SODIUM CHLORIDE 0.9 % IV SOLN: INTRAVENOUS | Status: AC

## 2022-12-07 MED ORDER — LEVOTHYROXINE SODIUM 50 MCG PO TABS
75.0000 ug | ORAL_TABLET | Freq: Every day | ORAL | Status: DC
  Administered 2022-12-08 – 2022-12-11 (×4): 75 ug via ORAL
  Filled 2022-12-07 (×4): qty 1

## 2022-12-07 MED ORDER — VALACYCLOVIR HCL 500 MG PO TABS
2000.0000 mg | ORAL_TABLET | Freq: Two times a day (BID) | ORAL | Status: AC
  Administered 2022-12-07 (×2): 2000 mg via ORAL
  Filled 2022-12-07 (×2): qty 4

## 2022-12-07 NOTE — Progress Notes (Signed)
Physical Therapy Treatment Patient Details Name: Kaitlin Kelley MRN: [id number] DOB: 1951/04/05 Today's Date: 12/07/2022   History of Present Illness Kaitlin Kelley is a 71yoF who comes to [facility] on 12/05/22 after a fall to ground at home related to a hypoglycemic event. PMH: DM, sarcoidosis. Workup revealing of left hip intertrochanteric fracture. Pt went for Left femur ORIF IM nail on 5/28 c Dr Joice Lofts. Pt has WBAT orders from ortho team.    PT Comments    Pt in bed, agreeable to session. Reports successfully mobilizing with NSG last night for toilet needs, no repeat presyncopal episode. Author waited until pain meds in place, pt recently had a bath and pain was somewhat elevated. ModA to EOB, no presyncope here. Pt able to rise to standing with RW, again no presyncope, but after making it to recliner 30sec later, pt appears less alert and with malaise again. Good recovery of symptoms with chair recline, feet elevation after ~2-3 minutes. Pt encouraged to sit up in recliner as tolerated, use call bell if symptoms not resolved in 5 minutes. Pt assisted with phone/menu for lunch order. AMB deferred until symptoms more tolerant of upright activity.      Recommendations for follow up therapy are one component of a multi-disciplinary discharge planning process, led by the attending physician.  Recommendations may be updated based on patient status, additional functional criteria and insurance authorization.  Follow Up Recommendations       Assistance Recommended at Discharge Intermittent Supervision/Assistance  Patient can return home with the following A lot of help with walking and/or transfers;Assistance with cooking/housework;Help with stairs or ramp for entrance   Equipment Recommendations  Rolling walker (2 wheels);BSC/3in1    Recommendations for Other Services       Precautions / Restrictions Precautions Precautions: Fall Restrictions Weight Bearing Restrictions: No LLE Weight  Bearing: Weight bearing as tolerated     Mobility  Bed Mobility Overal bed mobility: Needs Assistance Bed Mobility: Supine to Sit     Supine to sit: Mod assist     General bed mobility comments: a bit more assist provided to keep pain control at goal and r/o as cause of hypotensive presyncope    Transfers Overall transfer level: Needs assistance Equipment used: Rolling walker (2 wheels) Transfers: Sit to/from Stand, Bed to chair/wheelchair/BSC Sit to Stand: Min assist   Step pivot transfers: Min guard (cannot clear Left foot, uses james brown shuffle on left instead)       General transfer comment: able to rise from elevated surface minGuard    Ambulation/Gait Ambulation/Gait assistance:  (deferred, pt is presyncopal by end of pivot transfer)                 Stairs             Wheelchair Mobility    Modified Rankin (Stroke Patients Only)       Balance                                            Cognition                                                Exercises      General Comments  Pertinent Vitals/Pain Pain Assessment Pain Assessment: 0-10 Pain Score: 7  Pain Location: L hip Pain Descriptors / Indicators: Dull, Discomfort Pain Intervention(s): Limited activity within patient's tolerance, Monitored during session, Premedicated before session, Repositioned    Home Living                          Prior Function            PT Goals (current goals can now be found in the care plan section) Acute Rehab PT Goals Patient Stated Goal: regain strength and mobility PT Goal Formulation: With patient Time For Goal Achievement: 12/20/22 Potential to Achieve Goals: Good Progress towards PT goals: Progressing toward goals    Frequency    Min 4X/week      PT Plan Current plan remains appropriate    Co-evaluation              AM-PAC PT "6 Clicks" Mobility   Outcome  Measure  Help needed turning from your back to your side while in a flat bed without using bedrails?: A Lot Help needed moving from lying on your back to sitting on the side of a flat bed without using bedrails?: A Lot Help needed moving to and from a bed to a chair (including a wheelchair)?: A Lot Help needed standing up from a chair using your arms (e.g., wheelchair or bedside chair)?: A Lot Help needed to walk in hospital room?: Total Help needed climbing 3-5 steps with a railing? : Total 6 Click Score: 10    End of Session Equipment Utilized During Treatment: Gait belt Activity Tolerance: Treatment limited secondary to medical complications (Comment) Patient left: with call bell/phone within reach;in chair (reclined for recovery) Nurse Communication: Mobility status PT Visit Diagnosis: Other abnormalities of gait and mobility (R26.89);Muscle weakness (generalized) (M62.81);Difficulty in walking, not elsewhere classified (R26.2)     Time: 1914-7829 PT Time Calculation (min) (ACUTE ONLY): 15 min  Charges:  $Therapeutic Activity: 8-22 mins                    1:31 PM, 12/07/22 Rosamaria Lints, PT, DPT Physical Therapist - [facility]  [phone] (ASCOM)    Hadiyah Maricle C 12/07/2022, 1:28 PM

## 2022-12-07 NOTE — Plan of Care (Signed)

## 2022-12-07 NOTE — Progress Notes (Signed)
Subjective: 2 Days Post-Op Procedure(s) (LRB): INTRAMEDULLARY (IM) NAIL INTERTROCHANTERIC (Left) Patient reports pain as mild in the left leg this morning. Patient is well, and has had no acute complaints or problems Increased confusion last night, CT head negative for acute process. PT and care management to assist with discharge planning.  Will likely need SNF following discharge. Negative for chest pain and shortness of breath Fever: no Gastrointestinal:Negative for nausea and vomiting Reports she is urinating well, not passing much gas at this time.  Objective: Vital signs in last 24 hours: Temp:  [98 F (36.7 C)-98.9 F (37.2 C)] 98 F (36.7 C) (05/30 0813) Pulse Rate:  [71-83] 71 (05/30 0813) Resp:  [16-20] 18 (05/30 0813) BP: (95-126)/(52-77) 123/52 (05/30 0813) SpO2:  [91 %-99 %] 99 % (05/30 0813)  Intake/Output from previous day:  Intake/Output Summary (Last 24 hours) at 12/07/2022 0944 Last data filed at 12/07/2022 0400 Gross per 24 hour  Intake 440 ml  Output 1050 ml  Net -610 ml    Intake/Output this shift: No intake/output data recorded.  Labs: Recent Labs    12/05/22 0342 12/06/22 0713  HGB 12.6 9.2*   Recent Labs    12/05/22 0342 12/06/22 0713  WBC 6.3 8.6  RBC 3.60* 2.59*  HCT 37.1 27.5*  PLT 302 280   Recent Labs    12/06/22 0551 12/07/22 0703  NA 134* 138  K 4.6 4.1  CL 104 108  CO2 20* 21*  BUN 37* 49*  CREATININE 2.15* 1.87*  GLUCOSE 168* 186*  CALCIUM 8.3* 7.8*   Recent Labs    12/05/22 0342  INR 1.0     EXAM General - Patient is Alert, Appropriate, and Oriented Extremity - ABD soft Neurovascular intact Dorsiflexion/Plantar flexion intact Incision: dressing C/D/I No cellulitis present Compartment soft Dressing/Incision - clean, dry, no drainage, swelling noted to the left thigh but compartment is soft. Motor Function - intact, moving foot and toes well on exam.  Abdomen soft with intact bowel sounds.  Past Medical  History:  Diagnosis Date   Arthritis    Chronic cough    Depression    Diabetes mellitus without complication (HCC)    Edema    LEGS/FEET   GERD (gastroesophageal reflux disease)    NO MEDS   Headache    H/O MIGRAINES   HOH (hard of hearing)    Hypertension    Hypothyroidism    IBS (irritable bowel syndrome)    Sleep apnea    CPAP    Assessment/Plan: 2 Days Post-Op Procedure(s) (LRB): INTRAMEDULLARY (IM) NAIL INTERTROCHANTERIC (Left) Principal Problem:   Hip fracture (HCC) Active Problems:   Hypertension   Type 2 diabetes mellitus (HCC)   Sarcoidosis   Hypothyroidism   Fall at home, initial encounter   CKD (chronic kidney disease) stage 3, GFR 30-59 ml/min (HCC)  Estimated body mass index is 35.83 kg/m as calculated from the following:   Height as of this encounter: 5\' 6"  (1.676 m).   Weight as of this encounter: 100.7 kg. Advance diet Up with therapy D/C IV fluids when tolerating po intake.  Vitals reviewed this AM, no recent fever. Reports she is urinating well, continue to work on BM. Patient with AKI, switched to heparin, Cr improving this morning. Patient lives at home with son, up with therapy today. May need SNF upon discharge pending on performance with PT.  DVT Prophylaxis - Lovenox, TED hose, and SCDs Weight-Bearing as tolerated to left leg  J. Horris Latino, PA-C [facility] 12/07/2022, 9:44 AM

## 2022-12-07 NOTE — Progress Notes (Signed)
PROGRESS NOTE    Kaitlin Kelley   ONG:[id number] DOB: 08-12-1950  DOA: 12/05/2022 Date of Service: 12/07/22 PCP: Elissa Hefty, MD     Brief Narrative / Hospital Course:  Kaitlin Kelley is a 72 y.o. female with medical history significant of hypertension, hypothyroidism, type 2 diabetes, sarcoidosis, Rheumatoid arthritis presenting from home to ED 12/05/22 with fall and left hip fracture. Patient reports feeling her sugar was low overnight, attempted to go to the fridge to get something to eat.  Developed generalized weakness with subsequent fall.   05/28: Left hip plain films with acute intertrochanteric left femur fracture with varus deformity. TO OR for reduction and internal fixation of displaced intertrochanteric left hip fracture with [employer] TFN nail w/ Dr Joice Lofts. 05/29: worsening renal function. Suspect hypovolemia/hypotension related, FeNa = prerenal. IV fluids through today, holding BP meds and other nephrotoxics, follow BMP. Suspect hospital delirium, medication effect but no CT head in ED w/ fall, CT done this evening, no concerns  05/30: AKI improving, not back to baseline, conitnue IV fluids. TOC working on SNF placement.   Consultants:  orthopedics  Procedures: 12/05/2022 L intramedullary intertrochanteric nail fixation of displaced intertrochanteric left hip fracture         ASSESSMENT & PLAN:   Principal Problem:   Hip fracture (HCC) Active Problems:   Hypertension   Type 2 diabetes mellitus (HCC)   Sarcoidosis   Hypothyroidism   Fall at home, initial encounter   CKD (chronic kidney disease) stage 3, GFR 30-59 ml/min (HCC)   Hip fracture (HCC) Fall at home, initial encounter intertrochanteric left femur fracture with varus deformity status post mechanical fall POD2 s/p nail repair  Advance diet Up with therapy PT/OT recs for SNF rehab, TOC following   AKI (acute kidney injury) (HCC) - improving  CKD (chronic kidney disease) stage 3a,  GFR 30-59 ml/min (HCC) Creatinine 1.3 initially with GFR 45, worsening yesterday to Cr 2.15 and GFR 24 --> today Cr 1.87 and GFR 28 FeNa is prerenal and Cr is improving w/ fluids  Lovenox --> heparin Holding ARB Reducing gabapentin Holding NSAID IV fluids - NS 1500 mL yesterday, repeat today   I&O - good UOP Follow BMP - if continuing to improve would not wait until baseline to discharge if placement becomes available   Confusion - improved Per son, new yesterday  Suspect hospital delirium, medication effect but no CT head in ED w/ fall, obtained CT 05/29 no concerns  No concerns on neuro exam  Monitor   Anemia Post-op but baseline macrocytosis  Normal B12 and folate  Monitor CBC Follow outpatient   Hypertension BP stable Titrate home regimen Holding nephrotoxins in setting of AKI  Type 2 diabetes mellitus (HCC) SSI ac hs + basal   Sarcoidosis Appears stable Chest x-ray within normal limits Continue home regimen   Hypothyroidism Continue Synthroid   Fever blister to lower lip Valtrex ordered    DVT prophylaxis: heparin Pertinent IV fluids/nutrition: NS 125 mL/h x12h today  Central lines / invasive devices: none  Code Status: FULL CODE ACP documentation reviewed: 12/06/22, none on file VYNCA   Current Admission Status: inpatient  TOC needs / Dispo plan: pending PT/OT recs may need SNF/HH Barriers to discharge / significant pending items: AKI, placement, anticipate can discharge tomorrow if renal function appropriate and placement can be arranged              Subjective / Brief ROS:  Patient reports feeling okay today Denies CP/SOB.  Pain  controlled.  Denies new weakness.  Tolerating diet.  Reports no concerns w/ urination/defecation.   Family Communication: son at bedside on rounds - notes less confusion today     Objective Findings:  Vitals:   12/06/22 1531 12/06/22 1549 12/06/22 2348 12/07/22 0813  BP: (!) 95/53  (!) 112/54 (!) 123/52   Pulse: 79  83 71  Resp: 20  16 18   Temp: 98.2 F (36.8 C)  98.9 F (37.2 C) 98 F (36.7 C)  TempSrc:      SpO2: 96% 92% 91% 99%  Weight:      Height:        Intake/Output Summary (Last 24 hours) at 12/07/2022 1217 Last data filed at 12/07/2022 1108 Gross per 24 hour  Intake 500 ml  Output 1050 ml  Net -550 ml   Filed Weights   12/05/22 0334 12/05/22 0655  Weight: 102.1 kg 100.7 kg    Examination:  Physical Exam Constitutional:      General: She is not in acute distress.    Appearance: She is obese.  Cardiovascular:     Rate and Rhythm: Normal rate and regular rhythm.     Heart sounds: Normal heart sounds.  Pulmonary:     Effort: Pulmonary effort is normal.     Breath sounds: Normal breath sounds.  Abdominal:     Palpations: Abdomen is soft.     Tenderness: There is no abdominal tenderness.  Musculoskeletal:     Right lower leg: No edema.     Left lower leg: No edema.  Skin:    General: Skin is warm and dry.  Neurological:     General: No focal deficit present.     Mental Status: She is alert and oriented to person, place, and time.     Cranial Nerves: No dysarthria or facial asymmetry.     Sensory: Sensation is intact. No sensory deficit.     Motor: No weakness, tremor, abnormal muscle tone or pronator drift.     Coordination: Coordination is intact.  Psychiatric:        Mood and Affect: Mood normal.        Behavior: Behavior normal.          Scheduled Medications:   calcium-vitamin D  1 tablet Oral Q breakfast   docusate sodium  100 mg Oral BID   DULoxetine  60 mg Oral BID   gabapentin  300 mg Oral BID   heparin injection (subcutaneous)  5,000 Units Subcutaneous Q8H   insulin aspart  0-15 Units Subcutaneous TID WC   insulin aspart  4 Units Subcutaneous TID WC   insulin glargine-yfgn  10 Units Subcutaneous QHS   mirtazapine  15 mg Oral QHS   rosuvastatin  40 mg Oral Daily   sulfaSALAzine  1,000 mg Oral BID   valACYclovir  2,000 mg Oral BID     Continuous Infusions:  sodium chloride 125 mL/hr at 12/07/22 1157    PRN Medications:  acetaminophen, bisacodyl, diphenhydrAMINE, HYDROcodone-acetaminophen, magnesium hydroxide, metoCLOPramide **OR** metoCLOPramide (REGLAN) injection, ondansetron **OR** ondansetron (ZOFRAN) IV, sodium phosphate  Antimicrobials from admission:  Anti-infectives (From admission, onward)    Start     Dose/Rate Route Frequency Ordered Stop   12/07/22 1100  valACYclovir (VALTREX) tablet 2,000 mg        2,000 mg Oral 2 times daily 12/07/22 1005 12/08/22 0959   12/05/22 2000  ceFAZolin (ANCEF) IVPB 2g/100 mL premix        2 g 200 mL/hr over 30  Minutes Intravenous Every 8 hours 12/05/22 1601 12/06/22 0448   12/05/22 0512  ceFAZolin (ANCEF) IVPB 2g/100 mL premix        2 g 200 mL/hr over 30 Minutes Intravenous 30 min pre-op 12/05/22 1610 12/05/22 1226           Data Reviewed:  I have personally reviewed the following...  CBC: Recent Labs  Lab 12/05/22 0342 12/06/22 0713  WBC 6.3 8.6  NEUTROABS 4.0  --   HGB 12.6 9.2*  HCT 37.1 27.5*  MCV 103.1* 106.2*  PLT 302 280   Basic Metabolic Panel: Recent Labs  Lab 12/05/22 0342 12/06/22 0551 12/07/22 0703  NA 137 134* 138  K 3.5 4.6 4.1  CL 102 104 108  CO2 20* 20* 21*  GLUCOSE 179* 168* 186*  BUN 22 37* 49*  CREATININE 1.27* 2.15* 1.87*  CALCIUM 8.9 8.3* 7.8*   GFR: Estimated Creatinine Clearance: 32.1 mL/min (A) (by C-G formula based on SCr of 1.87 mg/dL (H)). Liver Function Tests: Recent Labs  Lab 12/06/22 0551  AST 21  ALT 12  ALKPHOS 52  BILITOT 0.3  PROT 5.0*  ALBUMIN 2.8*   No results for input(s): "LIPASE", "AMYLASE" in the last 168 hours. No results for input(s): "AMMONIA" in the last 168 hours. Coagulation Profile: Recent Labs  Lab 12/05/22 0342  INR 1.0   Cardiac Enzymes: No results for input(s): "CKTOTAL", "CKMB", "CKMBINDEX", "TROPONINI" in the last 168 hours. BNP (last 3 results) No results for  input(s): "PROBNP" in the last 8760 hours. HbA1C: Recent Labs    12/05/22 0337  HGBA1C 9.4*   CBG: Recent Labs  Lab 12/06/22 1632 12/06/22 2207 12/07/22 0425 12/07/22 0813 12/07/22 1149  GLUCAP 135* 143* 202* 161* 214*   Lipid Profile: No results for input(s): "CHOL", "HDL", "LDLCALC", "TRIG", "CHOLHDL", "LDLDIRECT" in the last 72 hours. Thyroid Function Tests: No results for input(s): "TSH", "T4TOTAL", "FREET4", "T3FREE", "THYROIDAB" in the last 72 hours. Anemia Panel: Recent Labs    12/06/22 0551 12/06/22 1125  VITAMINB12  --  675  FOLATE 12.3  --    Most Recent Urinalysis On File:     Component Value Date/Time   COLORURINE YELLOW 02/24/2018 1435   APPEARANCEUR CLOUDY (A) 02/24/2018 1435   LABSPEC 1.020 02/24/2018 1435   PHURINE 5.0 02/24/2018 1435   GLUCOSEU 500 (A) 02/24/2018 1435   HGBUR TRACE (A) 02/24/2018 1435   BILIRUBINUR SMALL (A) 02/24/2018 1435   KETONESUR TRACE (A) 02/24/2018 1435   PROTEINUR 100 (A) 02/24/2018 1435   NITRITE NEGATIVE 02/24/2018 1435   LEUKOCYTESUR NEGATIVE 02/24/2018 1435   Sepsis Labs: @LABRCNTIP (procalcitonin:4,lacticidven:4) Microbiology: No results found for this or any previous visit (from the past 240 hour(s)).    Radiology Studies last 3 days: CT HEAD WO CONTRAST ( )  Result Date: 12/06/2022 CLINICAL DATA:  Head trauma EXAM: CT HEAD WITHOUT CONTRAST TECHNIQUE: Contiguous axial images were obtained from the base of the skull through the vertex without intravenous contrast. RADIATION DOSE REDUCTION: This exam was performed according to the departmental dose-optimization program which includes automated exposure control, adjustment of the mA and/or kV according to patient size and/or use of iterative reconstruction technique. COMPARISON:  12/06/2009 FINDINGS: Brain: No evidence of acute infarction, hemorrhage, mass, mass effect, or midline shift. No hydrocephalus or extra-axial fluid collection. Vascular: No hyperdense vessel.  Skull: Negative for fracture or focal lesion. Sinuses/Orbits: No acute finding. Status post bilateral lens replacements. Other: The mastoid air cells are well aerated. IMPRESSION: No acute intracranial process.  Electronically Signed   By: Wiliam Ke M.D.   On: 12/06/2022 20:43   DG HIP UNILAT WITH PELVIS 2-3 VIEWS LEFT  Result Date: 12/05/2022 CLINICAL DATA:  Elective surgery. EXAM: DG HIP (WITH OR WITHOUT PELVIS) 2-3V LEFT COMPARISON:  Radiograph earlier today FINDINGS: Nine fluoroscopic spot views of the pelvis and left hip obtained in the operating room. Sequential images intramedullary nail with trans trochanteric and distal locking screw fixation of proximal femur fracture. Fluoroscopy time 1 minute 44 seconds. Dose 45.6 mGy. IMPRESSION: Intraoperative fluoroscopy during proximal femur fracture ORIF. Electronically Signed   By: Narda Rutherford M.D.   On: 12/05/2022 14:27   DG C-Arm 1-60 Min-No Report  Result Date: 12/05/2022 Fluoroscopy was utilized by the requesting physician.  No radiographic interpretation.   DG C-Arm 1-60 Min-No Report  Result Date: 12/05/2022 Fluoroscopy was utilized by the requesting physician.  No radiographic interpretation.   DG Chest Portable 1 View  Result Date: 12/05/2022 CLINICAL DATA:  Fall injury. EXAM: PORTABLE CHEST 1 VIEW COMPARISON:  AP Lat 11/08/2022 FINDINGS: There is mild cardiomegaly. There is aortic tortuosity and mild ectasia with patchy calcifications and stable mediastinum. No vascular congestion is seen. The lungs are clear with asymmetric chronic elevation of the right diaphragm. No pleural effusion is evident. There is osteopenia and bridging enthesopathy of the thoracic spine. Multiple overlying telemetry leads. IMPRESSION: No acute radiographic chest findings.  Chronic findings as above. Electronically Signed   By: Almira Bar M.D.   On: 12/05/2022 04:50   DG HIP UNILAT WITH PELVIS 2-3 VIEWS LEFT  Result Date: 12/05/2022 CLINICAL DATA:   Hip fracture EXAM: DG HIP (WITH OR WITHOUT PELVIS) 2-3V LEFT COMPARISON:  None Available. FINDINGS: Acute intertrochanteric left femur fracture with varus angulation. No evidence of pelvic ring fracture or diastasis. Subjective osteopenia. IMPRESSION: Acute intertrochanteric left femur fracture with varus deformity. Electronically Signed   By: Tiburcio Pea M.D.   On: 12/05/2022 04:44             LOS: 2 days       Sunnie Nielsen, DO [facility] 12/07/2022, 12:17 PM    Dictation software may have been used to generate the above note. Typos may occur and escape review in typed/dictated notes. Please contact Dr Lyn Hollingshead directly for clarity if needed.  Staff may message me via secure chat in Epic  but this may not receive an immediate response,  please page me for urgent matters!  If 7PM-7AM, please contact night coverage [email or web address]

## 2022-12-07 NOTE — Consult Note (Signed)
ORTHOPAEDIC CONSULTATION  REQUESTING PHYSICIAN: Sunnie Nielsen, DO  Chief Complaint:   Left hip pain.  History of Present Illness: Kaitlin Kelley is a 72 y.o. female with multiple medical problems including diabetes, gastroesophageal reflux disease, hypertension, hypothyroidism, sleep apnea, and depression who normally lives independently with her son.  Apparently, the patient got up last night to eat an orange as she felt that her her blood sugars were low, but became weak, causing her to lose her balance and fall onto her left side.  She was unable to get up and so was brought to the emergency room by EMS where x-rays demonstrated a displaced intertrochanteric fracture of her left hip.  The patient denies any associated injuries.  She did not strike her head or lose consciousness.  She also denies any chest pain, shortness of breath, or other symptoms which may have contributed to the fall, other than the lightheadedness she was experiencing due to her apparent hypoglycemic episode.  Past Medical History:  Diagnosis Date   Arthritis    Chronic cough    Depression    Diabetes mellitus without complication (HCC)    Edema    LEGS/FEET   GERD (gastroesophageal reflux disease)    NO MEDS   Headache    H/O MIGRAINES   HOH (hard of hearing)    Hypertension    Hypothyroidism    IBS (irritable bowel syndrome)    Sleep apnea    CPAP   Past Surgical History:  Procedure Laterality Date   EYE SURGERY     GAS INSERTION Left 03/17/2015   Procedure: INSERTION OF GAS;  Surgeon: Marcelene Butte, MD;  Location: [facility] OR[facility];  Service: Ophthalmology;  Laterality: Left;  [id number]   GASTRIC BYPASS     HAND SURGERY     THUMB AND PINKY FINGER    INTRAMEDULLARY (IM) NAIL INTERTROCHANTERIC Left 12/05/2022   Procedure: INTRAMEDULLARY (IM) NAIL INTERTROCHANTERIC;  Surgeon: Christena Flake, MD;  Location: [facility] OR[facility];  Service: Orthopedics;   Laterality: Left;   PARS PLANA VITRECTOMY Left 03/17/2015   Procedure: PARS PLANA VITRECTOMY WITH 25 GAUGE;  Surgeon: Marcelene Butte, MD;  Location: [facility] OR[facility];  Service: Ophthalmology;  Laterality: Left;   SHOULDER ARTHROSCOPY     TONSILLECTOMY     TUBAL LIGATION     VIDEO BRONCHOSCOPY WITH ENDOBRONCHIAL NAVIGATION N/A 03/05/2020   Procedure: VIDEO BRONCHOSCOPY WITH ENDOBRONCHIAL NAVIGATION;  Surgeon: Vida Rigger, MD;  Location: [facility] OR[facility];  Service: Thoracic;  Laterality: N/A;   Social History   Socioeconomic History   Marital status: Widowed    Spouse name: Not on file   Number of children: Not on file   Years of education: Not on file   Highest education level: Not on file  Occupational History   Not on file  Tobacco Use   Smoking status: Never   Smokeless tobacco: Never  Vaping Use   Vaping Use: Never used  Substance and Sexual Activity   Alcohol use: Not Currently    Comment: RARE   Drug use: No   Sexual activity: Not on file  Other Topics Concern   Not on file  Social History Narrative   Lives in [place] with husband; 2 sons; never smoked; rare alcohol; customer service; retd. Used to live in [place].    Social Determinants of Health   Financial Resource Strain: Not on file  Food Insecurity: No Food Insecurity (12/06/2022)   Hunger Vital Sign    Worried About Running Out of Food in the Last  Year: Never true    Ran Out of Food in the Last Year: Never true  Transportation Needs: No Transportation Needs (12/06/2022)   PRAPARE - Administrator, Civil Service (Medical): No    Lack of Transportation (Non-Medical): No  Physical Activity: Not on file  Stress: Not on file  Social Connections: Not on file   Family History  Problem Relation Age of Onset   Leukemia Brother        died at 61 years   Diabetes Brother    Prostate cancer Brother    Breast cancer Paternal Aunt    Brain cancer Cousin    Diabetes Father    Macular degeneration Niece     No Known Allergies Prior to Admission medications   Medication Sig Start Date End Date Taking? Authorizing Provider  amLODipine (NORVASC) 5 MG tablet Take 5 mg by mouth at bedtime.  06/10/19  Yes [provider]  azelastine (ASTELIN) 0.1 % nasal spray Place 1 spray into both nostrils as needed for allergies. 11/19/19  Yes [provider]  Bacillus Coagulans-Inulin (ALIGN PREBIOTIC-PROBIOTIC PO) Take 1 capsule by mouth daily.   Yes [provider]  calcium-vitamin D (OSCAL WITH D) 500-5 MG-MCG tablet Take 1 tablet by mouth. 1000mg    Yes [provider]  Cholecalciferol 50 MCG (2000 UT) CAPS Take 2,000 Units by mouth daily.    Yes [provider]  Collagen-Vitamin C-Biotin (COLLAGEN 1500/C) 500-50-0.8 MG CAPS daily.   Yes [provider]  cyanocobalamin 100 MCG tablet Take by mouth.   Yes [provider]  Cyanocobalamin 3000 MCG CAPS Take 3,000 mcg by mouth daily.  04/02/18  Yes [provider]  dicyclomine (BENTYL) 10 MG capsule Take 10 mg by mouth 4 (four) times daily as needed for spasms.  09/12/19  Yes [provider]  dorzolamide (TRUSOPT) 2 % ophthalmic solution Place 1 drop into both eyes in the morning and at bedtime.  12/25/18  Yes [provider]  DULoxetine (CYMBALTA) 60 MG capsule Take 60 mg by mouth 2 (two) times daily.    Yes [provider]  ferrous gluconate (FERGON) 240 (27 FE) MG tablet Take by mouth.   Yes [provider]  folic acid (FOLVITE) 1 MG tablet Take 1 mg by mouth daily.   Yes [provider]  gabapentin (NEURONTIN) 300 MG capsule Take 300 mg by mouth 3 (three) times daily.   Yes [provider]  glipiZIDE (GLUCOTROL) 10 MG tablet Take 10 mg by mouth in the morning and at bedtime.  11/24/19  Yes [provider]  insulin detemir (LEVEMIR) 100 UNIT/ML injection Inject 30 Units into the skin at bedtime.    Yes [provider]  Insulin  Glargine-Lixisenatide (SOLIQUA) 100-33 UNT-MCG/ML SOPN Inject 30 Units into the skin at bedtime.   Yes [provider]  Lactobacillus 0.05-0.05 MG TABS Take by mouth.   Yes [provider]  losartan (COZAAR) 50 MG tablet Take 50 mg by mouth daily.   Yes [provider]  methotrexate (RHEUMATREX) 2.5 MG tablet TAKE 3 TABLETS (7.5 MG TOTAL) BY MOUTH EVERY 7 (SEVEN) DAYS 05/23/20  Yes [provider]  mirtazapine (REMERON) 15 MG tablet Take 15 mg by mouth at bedtime. 01/07/20  Yes [provider]  Multiple Vitamin tablet Take 1 tablet by mouth daily.   Yes [provider]  Multiple Vitamins-Minerals (AIRBORNE PO) Take 2 tablets by mouth in the morning and at bedtime.  Yes [provider]  rosuvastatin (CRESTOR) 40 MG tablet Take 1 tablet by mouth daily. 02/22/22  Yes [provider]  sulfaSALAzine (AZULFIDINE) 500 MG tablet Take 1,000 mg by mouth 2 (two) times daily.   Yes [provider]  TURMERIC PO Take 1 capsule by mouth daily.    Yes [provider]  Lancets 28G MISC use to check blood sugar three to four times a day or as directed Patient not taking: Reported on 07/24/2022 05/15/14   [provider]  levothyroxine (SYNTHROID) 75 MCG tablet Take 1 tablet by mouth daily. Patient not taking: Reported on 07/24/2022 12/29/21   [provider]  levothyroxine (SYNTHROID, LEVOTHROID) 75 MCG tablet Take 75 mcg by mouth See admin instructions. Take 75 mcg by mouth daily before breakfast Monday through Saturday Patient not taking: Reported on 07/24/2022    [provider]  modafinil (PROVIGIL) 100 MG tablet Take by mouth. 07/17/22 10/15/22  [provider]   CT HEAD WO CONTRAST ( )  Result Date: 12/06/2022 CLINICAL DATA:  Head trauma EXAM: CT HEAD WITHOUT CONTRAST TECHNIQUE: Contiguous axial images were obtained from the base of the skull through the vertex without intravenous contrast.  RADIATION DOSE REDUCTION: This exam was performed according to the departmental dose-optimization program which includes automated exposure control, adjustment of the mA and/or kV according to patient size and/or use of iterative reconstruction technique. COMPARISON:  12/06/2009 FINDINGS: Brain: No evidence of acute infarction, hemorrhage, mass, mass effect, or midline shift. No hydrocephalus or extra-axial fluid collection. Vascular: No hyperdense vessel. Skull: Negative for fracture or focal lesion. Sinuses/Orbits: No acute finding. Status post bilateral lens replacements. Other: The mastoid air cells are well aerated. IMPRESSION: No acute intracranial process. Electronically Signed   By: Wiliam Ke M.D.   On: 12/06/2022 20:43    Positive ROS: All other systems have been reviewed and were otherwise negative with the exception of those mentioned in the HPI and as above.  Physical Exam: General:  Alert, no acute distress Psychiatric:  Patient is competent for consent with normal mood and affect   Cardiovascular:  No pedal edema Respiratory:  No wheezing, non-labored breathing GI:  Abdomen is soft and non-tender Skin:  No lesions in the area of chief complaint Neurologic:  Sensation intact distally Lymphatic:  No axillary or cervical lymphadenopathy  Orthopedic Exam:  Orthopedic examination is limited to the left hip and lower extremity.  The left lower extremity is shortened and externally rotated as compared to the right.  Skin inspection around the left hip is unremarkable.  No swelling, erythema, ecchymosis, abrasions, or other skin abnormalities identified.  The patient has some mild-moderate tenderness to palpation over the lateral aspect of the left hip.  She has more severe pain with any attempted active or passive motion of the hip.  She is grossly neurovascularly intact to the left lower extremity and foot.  X-rays:  X-rays of the pelvis and left hip are available for review and have  been reviewed by myself.  These films demonstrate a comminuted displaced intertrochanteric fracture of the left hip.  The left hip joint itself appears to be well-maintained and without evidence for significant degenerative changes.  No lytic lesions or other acute bony abnormalities are identified.  Assessment: Closed displaced intertrochanteric fracture left hip.  Plan: The treatment options, including both surgical and nonsurgical choices, have been discussed in detail with the patient and her son.  The patient and her son would like to proceed with surgical  intervention to include an intramedullary nailing of the displaced intertrochanteric fracture of her left hip.  The risks (including bleeding, infection, nerve and/or blood vessel injury, persistent or recurrent pain, loosening or failure of the components, leg length inequality, malunion and/or nonunion, need for further surgery, blood clots, strokes, heart attacks or arrhythmias, pneumonia, etc.) and benefits of the surgical procedure were discussed. The patient and her son state their understanding and agree to proceed.  A formal written consent will be obtained by the nursing staff.  Thank you for asking me to participate in the care of this most pleasant yet unfortunate woman.  I will be happy to follow her with you.   Maryagnes Amos, MD  Beeper #:  [phone]  12/05/2022 7:21 AM

## 2022-12-08 DIAGNOSIS — I10 Essential (primary) hypertension: Secondary | ICD-10-CM | POA: Diagnosis not present

## 2022-12-08 DIAGNOSIS — N183 Chronic kidney disease, stage 3 unspecified: Secondary | ICD-10-CM | POA: Diagnosis not present

## 2022-12-08 DIAGNOSIS — W19XXXA Unspecified fall, initial encounter: Secondary | ICD-10-CM | POA: Diagnosis not present

## 2022-12-08 DIAGNOSIS — S72002A Fracture of unspecified part of neck of left femur, initial encounter for closed fracture: Secondary | ICD-10-CM | POA: Diagnosis not present

## 2022-12-08 LAB — BASIC METABOLIC PANEL
Anion gap: 9 (ref 5–15)
BUN: 41 mg/dL — ABNORMAL HIGH (ref 8–23)
CO2: 19 mmol/L — ABNORMAL LOW (ref 22–32)
Calcium: 7.9 mg/dL — ABNORMAL LOW (ref 8.9–10.3)
Chloride: 110 mmol/L (ref 98–111)
Creatinine, Ser: 1.4 mg/dL — ABNORMAL HIGH (ref 0.44–1.00)
GFR, Estimated: 40 mL/min — ABNORMAL LOW (ref >60)
Glucose, Bld: 187 mg/dL — ABNORMAL HIGH (ref 70–99)
Potassium: 4 mmol/L (ref 3.5–5.1)
Sodium: 138 mmol/L (ref 135–145)

## 2022-12-08 LAB — GLUCOSE, CAPILLARY
Glucose-Capillary: 179 mg/dL — ABNORMAL HIGH (ref 70–99)
Glucose-Capillary: 189 mg/dL — ABNORMAL HIGH (ref 70–99)
Glucose-Capillary: 137 mg/dL — ABNORMAL HIGH (ref 70–99)
Glucose-Capillary: 110 mg/dL — ABNORMAL HIGH (ref 70–99)

## 2022-12-08 MED ORDER — HYDROCODONE-ACETAMINOPHEN 5-325 MG PO TABS: 1.0000 | ORAL_TABLET | ORAL | 0 refills | Status: AC | PRN

## 2022-12-08 NOTE — [place] FL2 (Signed)
[place] MEDICAID FL2 LEVEL OF CARE FORM     IDENTIFICATION  Patient Name: Kaitlin Kelley Birthdate: [birth year] Sex: female Admission Date (Current Location): 12/05/2022  [facility] and [place] Number:  Chiropodist and Address:  [facility], [address], [place], [place] [address]      Provider Number: [id number]  Attending Physician Name and Address:  Sunnie Nielsen, DO  Relative Name and Phone Number:  Geneen Ledesma [phone]    Current Level of Care: Hospital Recommended Level of Care: Skilled Nursing Facility Prior Approval Number:    Date Approved/Denied:   PASRR Number: [id number]  Discharge Plan: SNF    Current Diagnoses: Patient Active Problem List   Diagnosis Date Noted   Hip fracture (HCC) 12/05/2022   Hypertension 12/05/2022   Type 2 diabetes mellitus (HCC) 12/05/2022   Sarcoidosis 12/05/2022   Hypothyroidism 12/05/2022   Fall at home, initial encounter 12/05/2022   CKD (chronic kidney disease) stage 3, GFR 30-59 ml/min (HCC) 12/05/2022   Bursitis 07/12/2020   Mediastinal lymphadenopathy 01/19/2020   Peroneal tendonitis 11/19/2015   Heel spur 10/05/2015   Tendonitis, Achilles, right 10/05/2015   Ankle sprain 10/05/2015    Orientation RESPIRATION BLADDER Height & Weight     Self, Time, Situation, Place  Normal Continent Weight: 100.7 kg Height:  5\' 6"  (167.6 cm)  BEHAVIORAL SYMPTOMS/MOOD NEUROLOGICAL BOWEL NUTRITION STATUS      Continent  (See Discharge Summary)  AMBULATORY STATUS COMMUNICATION OF NEEDS Skin   Extensive Assist Verbally Normal                       Personal Care Assistance Level of Assistance  Bathing, Feeding, Dressing Bathing Assistance: Maximum assistance Feeding assistance: Limited assistance Dressing Assistance: Maximum assistance     Functional Limitations Info  Sight, Hearing, Speech Sight Info: Adequate Hearing Info: Adequate Speech Info: Adequate    SPECIAL CARE  FACTORS FREQUENCY  PT (By licensed PT), OT (By licensed OT)     PT Frequency: 5x weekly OT Frequency: 5x weekly            Contractures Contractures Info: Not present    Additional Factors Info  Code Status, Allergies Code Status Info: Full Code Allergies Info: No Known Allergies           Current Medications (12/08/2022):  This is the current hospital active medication list Current Facility-Administered Medications  Medication Dose Route Frequency Provider Last Rate Last Admin   acetaminophen (TYLENOL) tablet 325-650 mg  325-650 mg Oral Q6H PRN Poggi, Excell Seltzer, MD       bisacodyl (DULCOLAX) suppository 10 mg  10 mg Rectal Daily PRN Poggi, Excell Seltzer, MD       calcium-vitamin D (OSCAL WITH D) 500-5 MG-MCG per tablet 1 tablet  1 tablet Oral Q breakfast Poggi, Excell Seltzer, MD   1 tablet at 12/08/22 8469   diphenhydrAMINE (BENADRYL) 12.5 MG/5ML elixir 12.5-25 mg  12.5-25 mg Oral Q4H PRN Poggi, Excell Seltzer, MD       docusate sodium (COLACE) capsule 100 mg  100 mg Oral BID Poggi, Excell Seltzer, MD   100 mg at 12/08/22 1005   DULoxetine (CYMBALTA) DR capsule 60 mg  60 mg Oral BID Christena Flake, MD   60 mg at 12/08/22 1005   gabapentin (NEURONTIN) capsule 300 mg  300 mg Oral BID Sunnie Nielsen, DO   300 mg at 12/08/22 1005   heparin injection 5,000 Units  5,000 Units Subcutaneous  Q8H Sunnie Nielsen, DO   5,000 Units at 12/08/22 1412   HYDROcodone-acetaminophen (NORCO/VICODIN) 5-325 MG per tablet 1-2 tablet  1-2 tablet Oral Q4H PRN Poggi, Excell Seltzer, MD   2 tablet at 12/08/22 1109   insulin aspart (novoLOG) injection 0-15 Units  0-15 Units Subcutaneous TID WC Sunnie Nielsen, DO   3 Units at 12/08/22 1231   insulin aspart (novoLOG) injection 4 Units  4 Units Subcutaneous TID WC Sunnie Nielsen, DO   4 Units at 12/08/22 1231   insulin glargine-yfgn (SEMGLEE) injection 10 Units  10 Units Subcutaneous QHS Sunnie Nielsen, DO   10 Units at 12/07/22 2205   levothyroxine (SYNTHROID) tablet 75 mcg  75 mcg  Oral Q0600 Sunnie Nielsen, DO   75 mcg at 12/08/22 1610   magnesium hydroxide (MILK OF MAGNESIA) suspension 30 mL  30 mL Oral Daily PRN Poggi, Excell Seltzer, MD       metoCLOPramide (REGLAN) tablet 5-10 mg  5-10 mg Oral Q8H PRN Poggi, Excell Seltzer, MD       Or   metoCLOPramide (REGLAN) injection 5-10 mg  5-10 mg Intravenous Q8H PRN Poggi, Excell Seltzer, MD       mirtazapine (REMERON) tablet 15 mg  15 mg Oral QHS Poggi, Excell Seltzer, MD   15 mg at 12/07/22 2204   ondansetron (ZOFRAN) tablet 4 mg  4 mg Oral Q6H PRN Poggi, Excell Seltzer, MD   4 mg at 12/08/22 1109   Or   ondansetron (ZOFRAN) injection 4 mg  4 mg Intravenous Q6H PRN Poggi, Excell Seltzer, MD       rosuvastatin (CRESTOR) tablet 40 mg  40 mg Oral Daily Poggi, Excell Seltzer, MD   40 mg at 12/08/22 1005   sodium phosphate (FLEET) 7-19 GM/118ML enema 1 enema  1 enema Rectal Once PRN Poggi, Excell Seltzer, MD       sulfaSALAzine (AZULFIDINE) EC tablet 1,000 mg  1,000 mg Oral BID Poggi, Excell Seltzer, MD   1,000 mg at 12/08/22 1005     Discharge Medications: Please see discharge summary for a list of discharge medications.  Relevant Imaging Results:  Relevant Lab Results:   Additional Information [id number]  Garret Reddish, RN

## 2022-12-08 NOTE — Progress Notes (Signed)
Physical Therapy Treatment Patient Details Name: Kaitlin Kelley MRN: [id number] DOB: Feb 13, 1951 Today's Date: 12/08/2022   History of Present Illness Kaitlin Kelley is a 71yoF who comes to [facility] on 12/05/22 after a fall to ground at home related to a hypoglycemic event. PMH: DM, sarcoidosis. Workup revealing of left hip intertrochanteric fracture. Pt went for Left femur ORIF IM nail on 5/28 c Dr Joice Lofts.    PT Comments    Pt in recliner, spaghetti at 12:00, appears largely untouched, but pt reports to be finished eating. Pt agreeable to session. Pt appears quite drowsy, admits to feeling tired all day. She is assisted with leg ROM in bed, but struggles to attend to cues for quality and attending to reps. Pt falls asleep during some reps. LLE much weaker and pain limited, barely squeaks out full ROM for LAQ, but needs >75% assist for seated Left marching. Pt able to perform 3 slow and sleepy STS transfers from recliner, but looks wiped out thereafter. Pt assisted back to recliner, feet elevated. All needs met.      Recommendations for follow up therapy are one component of a multi-disciplinary discharge planning process, led by the attending physician.  Recommendations may be updated based on patient status, additional functional criteria and insurance authorization.  Follow Up Recommendations       Assistance Recommended at Discharge Intermittent Supervision/Assistance  Patient can return home with the following A lot of help with walking and/or transfers;Assistance with cooking/housework;Help with stairs or ramp for entrance   Equipment Recommendations  Rolling walker (2 wheels);BSC/3in1    Recommendations for Other Services       Precautions / Restrictions Precautions Precautions: Fall Restrictions Weight Bearing Restrictions: Yes LLE Weight Bearing: Weight bearing as tolerated     Mobility  Bed Mobility                    Transfers Overall transfer level: Needs  assistance Equipment used: Rolling walker (2 wheels) Transfers: Sit to/from Stand Sit to Stand: Min guard           General transfer comment: 3x from recliner, cues for hand placement, 3rd one looks much weaker, less tolerant.    Ambulation/Gait                   Stairs             Wheelchair Mobility    Modified Rankin (Stroke Patients Only)       Balance                                            Cognition Arousal/Alertness: Awake/alert Behavior During Therapy: WFL for tasks assessed/performed Overall Cognitive Status: Within Functional Limits for tasks assessed                                          Exercises General Exercises - Lower Extremity Long Arc Quad: AROM, Both, 15 reps, Seated Hip Flexion/Marching: AROM, AAROM, Both, 10 reps, Seated, Limitations Hip Flexion/Marching Limitations: requires maxA of LLE for appropriate ROM    General Comments        Pertinent Vitals/Pain Pain Assessment Pain Assessment: Faces Faces Pain Scale: Hurts little more Pain Location: L hip Pain Descriptors / Indicators: Dull, Discomfort Pain Intervention(s): Limited  activity within patient's tolerance, Monitored during session, Premedicated before session    Home Living                          Prior Function            PT Goals (current goals can now be found in the care plan section) Acute Rehab PT Goals Patient Stated Goal: regain strength and mobility PT Goal Formulation: With patient Time For Goal Achievement: 12/20/22 Potential to Achieve Goals: Good Progress towards PT goals: Progressing toward goals    Frequency    Min 4X/week      PT Plan Current plan remains appropriate    Co-evaluation              AM-PAC PT "6 Clicks" Mobility   Outcome Measure  Help needed turning from your back to your side while in a flat bed without using bedrails?: A Lot Help needed moving from lying  on your back to sitting on the side of a flat bed without using bedrails?: A Lot Help needed moving to and from a bed to a chair (including a wheelchair)?: A Lot Help needed standing up from a chair using your arms (e.g., wheelchair or bedside chair)?: A Lot Help needed to walk in hospital room?: Total Help needed climbing 3-5 steps with a railing? : Total 6 Click Score: 10    End of Session   Activity Tolerance: Treatment limited secondary to medical complications (Comment) Patient left: with call bell/phone within reach;in chair Nurse Communication: Mobility status PT Visit Diagnosis: Other abnormalities of gait and mobility (R26.89);Muscle weakness (generalized) (M62.81);Difficulty in walking, not elsewhere classified (R26.2)     Time: 1240-1300 PT Time Calculation (min) (ACUTE ONLY): 20 min  Charges:  $Therapeutic Exercise: 8-22 mins                    1:12 PM, 12/08/22 Kaitlin Kelley, PT, DPT Physical Therapist - [facility]  [phone] (ASCOM)    Kaitlin Kelley C 12/08/2022, 1:07 PM

## 2022-12-08 NOTE — Inpatient Diabetes Management (Signed)
Inpatient Diabetes Program Recommendations  AACE/ADA: New Consensus Statement on Inpatient Glycemic Control  Target Ranges:  Prepandial:   less than 140 mg/dL      Peak postprandial:   less than 180 mg/dL (1-2 hours)      Critically ill patients:  140 - 180 mg/dL    Latest Reference Range & Units 12/07/22 08:13 12/07/22 11:49 12/07/22 17:24 12/07/22 21:37 12/08/22 07:57  Glucose-Capillary 70 - 99 mg/dL 161 (H) 096 (H) 045 (H) 149 (H) 179 (H)   Review of Glycemic Control  Diabetes history: DM2 Outpatient Diabetes medications: Soliqua 100-33 unit-mcg/ml 24 units QHS, Humalog 3-11 units with meals (takes 4 units with meals plus correction) Current orders for Inpatient glycemic control: Semglee 10 units QHS, Novolog 0-15 units TID with meals, Novolog 4 units TID with meals   Inpatient Diabetes Program Recommendations:     Insulin: Please consider increasing meal coverage to Novolog 6 units TID with meals if patient eats at least 50% of meals.   HbgA1C: A1C 9.4% on 12/05/22 indicating an average glucose of 223 mg/dl over the past 2-3 months. Prior A1C 8.6% on 09/06/22 when last seen Endocrinology.   Outpatient: At time of discharge, please consider prescribing Baqsimi (#[id number]) for treatment of severe hypoglycemia.    Thanks, Orlando Penner, RN, MSN, CDCES Diabetes Coordinator Inpatient Diabetes Program [phone] (Team Pager from 8am to 5pm)

## 2022-12-08 NOTE — Care Management Important Message (Signed)
Important Message  Patient Details  Name: Tyteanna Buckman MRN: [id number] Date of Birth: 10-20-1950   Medicare Important Message Given:  Yes     Olegario Messier A Shooter Tangen 12/08/2022, 11:14 AM

## 2022-12-08 NOTE — TOC Progression Note (Signed)
Transition of Care [facility]) - Progression Note    Patient Details  Name: Kaitlin Kelley MRN: [id number] Date of Birth: Nov 18, 1950  Transition of Care [facility]) CM/SW Contact  Garret Reddish, RN Phone Number: 12/08/2022, 4:17 PM  Clinical Narrative:   Chart reviewed.  I have meet with patient at bedside today.  She reports that prior to admission she lived at home.  She reported that her son lives at home with her.  [name] reports that she was able to bath and dress herself.  She was also driving prior to admission.    I have spoken with her about Short-term rehab on discharge.  She reports that she lives a few minutes from [facility] and would like to go their if possible.  She is agreeable to a SNF bed search in the [place] area.    I have completed SNF workup and completed bed search for [facility].    TOC will continue to follow for discharge planning.     Expected Discharge Plan: Skilled Nursing Facility Barriers to Discharge: Continued Medical Work up  Expected Discharge Plan and Services   Discharge Planning Services: CM Consult   Living arrangements for the past 2 months: Single Family Home                 DME Arranged: N/A DME Agency: NA       HH Arranged: NA           Social Determinants of Health (SDOH) Interventions SDOH Screenings   Food Insecurity: No Food Insecurity (12/06/2022)  Housing: Low Risk  (12/06/2022)  Transportation Needs: No Transportation Needs (12/06/2022)  Utilities: Not At Risk (12/06/2022)  Depression (PHQ2-9): High Risk (10/20/2022)  Tobacco Use: Low Risk  (12/06/2022)    Readmission Risk Interventions     No data to display

## 2022-12-08 NOTE — Progress Notes (Addendum)
Subjective: 3 Days Post-Op Procedure(s) (LRB): INTRAMEDULLARY (IM) NAIL INTERTROCHANTERIC (Left) Patient reports pain as mild in the left leg this morning. Patient is well, and has had no acute complaints or problems Patient is sleeping well this morning.  No signs of pain. Plan is for SNF placement following discharge Negative for chest pain and shortness of breath Fever: no Gastrointestinal:Negative for nausea and vomiting Reports she is urinating well and has had a BM.  Objective: Vital signs in last 24 hours: Temp:  [98 F (36.7 C)-98.8 F (37.1 C)] 98.8 F (37.1 C) (05/30 2140) Pulse Rate:  [71-84] 75 (05/30 2140) Resp:  [16-20] 20 (05/30 2140) BP: (123-132)/(44-56) 132/56 (05/30 2140) SpO2:  [94 %-99 %] 97 % (05/30 2140)  Intake/Output from previous day:  Intake/Output Summary (Last 24 hours) at 12/08/2022 0729 Last data filed at 12/08/2022 0600 Gross per 24 hour  Intake 1332.18 ml  Output 1275 ml  Net 57.18 ml    Intake/Output this shift: No intake/output data recorded.  Labs: Recent Labs    12/06/22 0713 12/07/22 1639  HGB 9.2* 8.2*   Recent Labs    12/06/22 0713 12/07/22 1639  WBC 8.6 8.0  RBC 2.59* 2.28*  HCT 27.5* 24.4*  PLT 280 232   Recent Labs    12/07/22 0703 12/08/22 0419  NA 138 138  K 4.1 4.0  CL 108 110  CO2 21* 19*  BUN 49* 41*  CREATININE 1.87* 1.40*  GLUCOSE 186* 187*  CALCIUM 7.8* 7.9*   No results for input(s): "LABPT", "INR" in the last 72 hours.    EXAM General - Patient is sleeping well this morning.  Patient is alert. Extremity - ABD soft Neurovascular intact Dorsiflexion/Plantar flexion intact Incision: dressing C/D/I No cellulitis present Compartment soft Dressing/Incision - clean, dry, no drainage, swelling noted to the left thigh but compartment is soft. Motor Function - intact, moving foot and toes well on exam.  Abdomen soft with intact bowel sounds.  Past Medical History:  Diagnosis Date   Arthritis     Chronic cough    Depression    Diabetes mellitus without complication (HCC)    Edema    LEGS/FEET   GERD (gastroesophageal reflux disease)    NO MEDS   Headache    H/O MIGRAINES   HOH (hard of hearing)    Hypertension    Hypothyroidism    IBS (irritable bowel syndrome)    Sleep apnea    CPAP    Assessment/Plan: 3 Days Post-Op Procedure(s) (LRB): INTRAMEDULLARY (IM) NAIL INTERTROCHANTERIC (Left) Principal Problem:   Hip fracture (HCC) Active Problems:   Hypertension   Type 2 diabetes mellitus (HCC)   Sarcoidosis   Hypothyroidism   Fall at home, initial encounter   CKD (chronic kidney disease) stage 3, GFR 30-59 ml/min (HCC)  Estimated body mass index is 35.83 kg/m as calculated from the following:   Height as of this encounter: 5\' 6"  (1.676 m).   Weight as of this encounter: 100.7 kg. Advance diet Up with therapy D/C IV fluids when tolerating po intake.  Vitals reviewed this AM, no recent fever. Reports she is urinating well, continue to work on BM. Patient with AKI, switched to heparin, Cr improving this morning. Patient lives at home with son, Plan is for d/c to SNF following discharge.  Following discharge, follow-up with [facility] orthopaedics in 10-14 days for staple removal. Discharge to SNF with Lovenox pending kidney function, continue heparin at this time.  DVT Prophylaxis - TED hose and  SCDs and Heparin Weight-Bearing as tolerated to left leg  J. Horris Latino, PA-C [facility] Orthopaedic Surgery 12/08/2022, 7:29 AM

## 2022-12-08 NOTE — Progress Notes (Signed)
PROGRESS NOTE    Kaitlin Kelley   ZOX:[id number] DOB: [birth year]  DOA: 12/05/2022 Date of Service: 12/08/22 PCP: Elissa Hefty, MD     Brief Narrative / Hospital Course:  Kaitlin Kelley is a 72 y.o. female with medical history significant of hypertension, hypothyroidism, type 2 diabetes, sarcoidosis, Rheumatoid arthritis presenting from home to ED 12/05/22 with fall and left hip fracture. Patient reports feeling her sugar was low overnight, attempted to go to the fridge to get something to eat.  Developed generalized weakness with subsequent fall.   05/28: Left hip plain films with acute intertrochanteric left femur fracture with varus deformity. TO OR for reduction and internal fixation of displaced intertrochanteric left hip fracture with [employer] TFN nail w/ Dr Joice Lofts. 05/29: worsening renal function. Suspect hypovolemia/hypotension related, FeNa = prerenal. IV fluids through today, holding BP meds and other nephrotoxics, follow BMP. Suspect hospital delirium, medication effect but no CT head in ED w/ fall, CT done this evening, no concerns  05/30: AKI improving, not back to baseline, conitnue IV fluids. TOC working on SNF placement.  05/31:Cr at baseline. Placement pending.   Consultants:  orthopedics  Procedures: 12/05/2022 L intramedullary intertrochanteric nail fixation of displaced intertrochanteric left hip fracture         ASSESSMENT & PLAN:   Principal Problem:   Hip fracture (HCC) Active Problems:   Hypertension   Type 2 diabetes mellitus (HCC)   Sarcoidosis   Hypothyroidism   Fall at home, initial encounter   CKD (chronic kidney disease) stage 3, GFR 30-59 ml/min (HCC)   Hip fracture (HCC) Fall at home, initial encounter intertrochanteric left femur fracture with varus deformity status post mechanical fall POD3 s/p nail repair  Advance diet Up with therapy PT/OT recs for SNF rehab, TOC following   AKI (acute kidney injury) (HCC) - improving   CKD (chronic kidney disease) stage 3a, GFR 30-59 ml/min (HCC) Creatinine 1.3 initially with GFR 45, worsening yesterday to Cr 2.15 and GFR 24 --> today Cr 1.87 and GFR 28 FeNa is prerenal and Cr is improving w/ fluids  Lovenox --> heparin Holding ARB Reducing gabapentin Holding NSAID IV fluids - NS 1500 mL yesterday, repeat today   I&O - good UOP Follow BMP - if continuing to improve would not wait until baseline to discharge if placement becomes available   Confusion - improved Per son, new yesterday  Suspect hospital delirium, medication effect but no CT head in ED w/ fall, obtained CT 05/29 no concerns  No concerns on neuro exam  Monitor   Anemia Post-op but baseline macrocytosis  Normal B12 and folate  Monitor CBC Follow outpatient   Hypertension BP stable Titrate home regimen Holding nephrotoxins in setting of AKI  Type 2 diabetes mellitus (HCC) SSI ac hs + basal   Sarcoidosis Appears stable Chest x-ray within normal limits Continue home regimen   Hypothyroidism Continue Synthroid   Fever blister to lower lip Valtrex ordered    DVT prophylaxis: heparin Pertinent IV fluids/nutrition: NS 125 mL/h x12h today  Central lines / invasive devices: none  Code Status: FULL CODE ACP documentation reviewed: 12/06/22, none on file VYNCA   Current Admission Status: inpatient  TOC needs / Dispo plan: pending PT/OT recs may need SNF/HH Barriers to discharge / significant pending items: AKI, placement, anticipate can discharge tomorrow if renal function appropriate and placement can be arranged              Subjective / Brief ROS:  Patient reports feeling  okay today No concenrs other than feeling tired  Denies CP/SOB.  Pain controlled.  Denies new weakness.  Tolerating diet.  Reports no concerns w/ urination/defecation.   Family Communication: son at bedside on rounds - notes less confusion today     Objective Findings:  Vitals:   12/07/22 1722  12/07/22 2140 12/08/22 0800 12/08/22 1628  BP: (!) 130/44 (!) 132/56 (!) 149/65 (!) 156/84  Pulse: 84 75 73 91  Resp: 16 20 18 18   Temp: 98.3 F (36.8 C) 98.8 F (37.1 C) 98.3 F (36.8 C) 97.9 F (36.6 C)  TempSrc:      SpO2: 94% 97% 99% 96%  Weight:      Height:        Intake/Output Summary (Last 24 hours) at 12/08/2022 1712 Last data filed at 12/08/2022 1109 Gross per 24 hour  Intake 542.73 ml  Output 1725 ml  Net -1182.27 ml   Filed Weights   12/05/22 0334 12/05/22 0655  Weight: 102.1 kg 100.7 kg    Examination:  Physical Exam Constitutional:      General: She is not in acute distress.    Appearance: She is obese.  Cardiovascular:     Rate and Rhythm: Normal rate and regular rhythm.     Heart sounds: Normal heart sounds.  Pulmonary:     Effort: Pulmonary effort is normal.     Breath sounds: Normal breath sounds.  Abdominal:     Palpations: Abdomen is soft.     Tenderness: There is no abdominal tenderness.  Musculoskeletal:     Right lower leg: No edema.     Left lower leg: No edema.  Skin:    General: Skin is warm and dry.  Neurological:     General: No focal deficit present.     Mental Status: She is alert and oriented to person, place, and time.     Cranial Nerves: No dysarthria or facial asymmetry.     Sensory: Sensation is intact. No sensory deficit.     Motor: No weakness, tremor, abnormal muscle tone or pronator drift.     Coordination: Coordination is intact.  Psychiatric:        Mood and Affect: Mood normal.        Behavior: Behavior normal.          Scheduled Medications:   calcium-vitamin D  1 tablet Oral Q breakfast   docusate sodium  100 mg Oral BID   DULoxetine  60 mg Oral BID   gabapentin  300 mg Oral BID   heparin injection (subcutaneous)  5,000 Units Subcutaneous Q8H   insulin aspart  0-15 Units Subcutaneous TID WC   insulin aspart  4 Units Subcutaneous TID WC   insulin glargine-yfgn  10 Units Subcutaneous QHS   levothyroxine   75 mcg Oral Q0600   mirtazapine  15 mg Oral QHS   rosuvastatin  40 mg Oral Daily   sulfaSALAzine  1,000 mg Oral BID    Continuous Infusions:    PRN Medications:  acetaminophen, bisacodyl, diphenhydrAMINE, HYDROcodone-acetaminophen, magnesium hydroxide, metoCLOPramide **OR** metoCLOPramide (REGLAN) injection, ondansetron **OR** ondansetron (ZOFRAN) IV, sodium phosphate  Antimicrobials from admission:  Anti-infectives (From admission, onward)    Start     Dose/Rate Route Frequency Ordered Stop   12/07/22 1100  valACYclovir (VALTREX) tablet 2,000 mg        2,000 mg Oral 2 times daily 12/07/22 1005 12/07/22 2205   12/05/22 2000  ceFAZolin (ANCEF) IVPB 2g/100 mL premix  2 g 200 mL/hr over 30 Minutes Intravenous Every 8 hours 12/05/22 1601 12/06/22 0448   12/05/22 0512  ceFAZolin (ANCEF) IVPB 2g/100 mL premix        2 g 200 mL/hr over 30 Minutes Intravenous 30 min pre-op 12/05/22 1610 12/05/22 1226           Data Reviewed:  I have personally reviewed the following...  CBC: Recent Labs  Lab 12/05/22 0342 12/06/22 0713 12/07/22 1639  WBC 6.3 8.6 8.0  NEUTROABS 4.0  --   --   HGB 12.6 9.2* 8.2*  HCT 37.1 27.5* 24.4*  MCV 103.1* 106.2* 107.0*  PLT 302 280 232   Basic Metabolic Panel: Recent Labs  Lab 12/05/22 0342 12/06/22 0551 12/07/22 0703 12/08/22 0419  NA 137 134* 138 138  K 3.5 4.6 4.1 4.0  CL 102 104 108 110  CO2 20* 20* 21* 19*  GLUCOSE 179* 168* 186* 187*  BUN 22 37* 49* 41*  CREATININE 1.27* 2.15* 1.87* 1.40*  CALCIUM 8.9 8.3* 7.8* 7.9*   GFR: Estimated Creatinine Clearance: 42.9 mL/min (A) (by C-G formula based on SCr of 1.4 mg/dL (H)). Liver Function Tests: Recent Labs  Lab 12/06/22 0551  AST 21  ALT 12  ALKPHOS 52  BILITOT 0.3  PROT 5.0*  ALBUMIN 2.8*   No results for input(s): "LIPASE", "AMYLASE" in the last 168 hours. No results for input(s): "AMMONIA" in the last 168 hours. Coagulation Profile: Recent Labs  Lab  12/05/22 0342  INR 1.0   Cardiac Enzymes: No results for input(s): "CKTOTAL", "CKMB", "CKMBINDEX", "TROPONINI" in the last 168 hours. BNP (last 3 results) No results for input(s): "PROBNP" in the last 8760 hours. HbA1C: No results for input(s): "HGBA1C" in the last 72 hours.  CBG: Recent Labs  Lab 12/07/22 1724 12/07/22 2137 12/08/22 0757 12/08/22 1154 12/08/22 1629  GLUCAP 202* 149* 179* 189* 137*   Lipid Profile: No results for input(s): "CHOL", "HDL", "LDLCALC", "TRIG", "CHOLHDL", "LDLDIRECT" in the last 72 hours. Thyroid Function Tests: Recent Labs    12/07/22 1639  TSH 0.744   Anemia Panel: Recent Labs    12/06/22 0551 12/06/22 1125 12/07/22 1639  VITAMINB12  --  675 638  FOLATE 12.3  --   --    Most Recent Urinalysis On File:     Component Value Date/Time   COLORURINE YELLOW 02/24/2018 1435   APPEARANCEUR CLOUDY (A) 02/24/2018 1435   LABSPEC 1.020 02/24/2018 1435   PHURINE 5.0 02/24/2018 1435   GLUCOSEU 500 (A) 02/24/2018 1435   HGBUR TRACE (A) 02/24/2018 1435   BILIRUBINUR SMALL (A) 02/24/2018 1435   KETONESUR TRACE (A) 02/24/2018 1435   PROTEINUR 100 (A) 02/24/2018 1435   NITRITE NEGATIVE 02/24/2018 1435   LEUKOCYTESUR NEGATIVE 02/24/2018 1435   Sepsis Labs: @LABRCNTIP (procalcitonin:4,lacticidven:4) Microbiology: No results found for this or any previous visit (from the past 240 hour(s)).    Radiology Studies last 3 days: CT HEAD WO CONTRAST ( )  Result Date: 12/06/2022 CLINICAL DATA:  Head trauma EXAM: CT HEAD WITHOUT CONTRAST TECHNIQUE: Contiguous axial images were obtained from the base of the skull through the vertex without intravenous contrast. RADIATION DOSE REDUCTION: This exam was performed according to the departmental dose-optimization program which includes automated exposure control, adjustment of the mA and/or kV according to patient size and/or use of iterative reconstruction technique. COMPARISON:  12/06/2009 FINDINGS: Brain: No  evidence of acute infarction, hemorrhage, mass, mass effect, or midline shift. No hydrocephalus or extra-axial fluid collection. Vascular: No hyperdense vessel.  Skull: Negative for fracture or focal lesion. Sinuses/Orbits: No acute finding. Status post bilateral lens replacements. Other: The mastoid air cells are well aerated. IMPRESSION: No acute intracranial process. Electronically Signed   By: Wiliam Ke M.D.   On: 12/06/2022 20:43   DG HIP UNILAT WITH PELVIS 2-3 VIEWS LEFT  Result Date: 12/05/2022 CLINICAL DATA:  Elective surgery. EXAM: DG HIP (WITH OR WITHOUT PELVIS) 2-3V LEFT COMPARISON:  Radiograph earlier today FINDINGS: Nine fluoroscopic spot views of the pelvis and left hip obtained in the operating room. Sequential images intramedullary nail with trans trochanteric and distal locking screw fixation of proximal femur fracture. Fluoroscopy time 1 minute 44 seconds. Dose 45.6 mGy. IMPRESSION: Intraoperative fluoroscopy during proximal femur fracture ORIF. Electronically Signed   By: Narda Rutherford M.D.   On: 12/05/2022 14:27   DG C-Arm 1-60 Min-No Report  Result Date: 12/05/2022 Fluoroscopy was utilized by the requesting physician.  No radiographic interpretation.   DG C-Arm 1-60 Min-No Report  Result Date: 12/05/2022 Fluoroscopy was utilized by the requesting physician.  No radiographic interpretation.   DG Chest Portable 1 View  Result Date: 12/05/2022 CLINICAL DATA:  Fall injury. EXAM: PORTABLE CHEST 1 VIEW COMPARISON:  AP Lat 11/08/2022 FINDINGS: There is mild cardiomegaly. There is aortic tortuosity and mild ectasia with patchy calcifications and stable mediastinum. No vascular congestion is seen. The lungs are clear with asymmetric chronic elevation of the right diaphragm. No pleural effusion is evident. There is osteopenia and bridging enthesopathy of the thoracic spine. Multiple overlying telemetry leads. IMPRESSION: No acute radiographic chest findings.  Chronic findings as  above. Electronically Signed   By: Almira Bar M.D.   On: 12/05/2022 04:50   DG HIP UNILAT WITH PELVIS 2-3 VIEWS LEFT  Result Date: 12/05/2022 CLINICAL DATA:  Hip fracture EXAM: DG HIP (WITH OR WITHOUT PELVIS) 2-3V LEFT COMPARISON:  None Available. FINDINGS: Acute intertrochanteric left femur fracture with varus angulation. No evidence of pelvic ring fracture or diastasis. Subjective osteopenia. IMPRESSION: Acute intertrochanteric left femur fracture with varus deformity. Electronically Signed   By: Tiburcio Pea M.D.   On: 12/05/2022 04:44             LOS: 3 days       Sunnie Nielsen, DO [facility] 12/08/2022, 5:12 PM    Dictation software may have been used to generate the above note. Typos may occur and escape review in typed/dictated notes. Please contact Dr Lyn Hollingshead directly for clarity if needed.  Staff may message me via secure chat in Epic  but this may not receive an immediate response,  please page me for urgent matters!  If 7PM-7AM, please contact night coverage [email or web address]

## 2022-12-08 NOTE — Plan of Care (Signed)

## 2022-12-08 NOTE — Progress Notes (Signed)
Occupational Therapy Treatment Patient Details Name: Kaitlin Kelley MRN: [id number] DOB: 10-04-1950 Today's Date: 12/08/2022   History of present illness Kaitlin Kelley is a 71yoF who comes to [facility] on 12/05/22 after a fall to ground at home related to a hypoglycemic event. PMH: DM, sarcoidosis. Workup revealing of left hip intertrochanteric fracture. Pt went for Left femur ORIF IM nail on 5/28 c Dr Joice Lofts.   OT comments  Kaitlin Kelley was seen for OT treatment on this date. Upon arrival to room pt reclined in bed, agreeable to tx. Pt requires MIN A + RW sit<>stand, improves to CGA + RW for BSC t/f and pericare in standing. SBA standing grooming tasks, cues to ID items. Pt making good progress toward goals, will continue to follow POC. Discharge recommendation remains appropriate.     Recommendations for follow up therapy are one component of a multi-disciplinary discharge planning process, led by the attending physician.  Recommendations may be updated based on patient status, additional functional criteria and insurance authorization.    Assistance Recommended at Discharge Frequent or constant Supervision/Assistance  Patient can return home with the following  A lot of help with walking and/or transfers;A lot of help with bathing/dressing/bathroom;Help with stairs or ramp for entrance   Equipment Recommendations  BSC/3in1;Tub/shower seat    Recommendations for Other Services      Precautions / Restrictions Precautions Precautions: Fall Restrictions Weight Bearing Restrictions: Yes LLE Weight Bearing: Weight bearing as tolerated       Mobility Bed Mobility Overal bed mobility: Needs Assistance Bed Mobility: Supine to Sit     Supine to sit: Min assist          Transfers Overall transfer level: Needs assistance Equipment used: Rolling walker (2 wheels) Transfers: Sit to/from Stand, Bed to chair/wheelchair/BSC Sit to Stand: Min assist                 Balance  Overall balance assessment: Needs assistance Sitting-balance support: No upper extremity supported, Feet supported Sitting balance-Leahy Scale: Good     Standing balance support: Single extremity supported, During functional activity Standing balance-Leahy Scale: Fair                             ADL either performed or assessed with clinical judgement   ADL Overall ADL's : Needs assistance/impaired                                       General ADL Comments: CGA + RW for BSC t/f and pericare in standing. SBA standing grooming tasks, cues to ID items (pt places toothpaste on mouth stating it is her lip balm)      Cognition Arousal/Alertness: Awake/alert Behavior During Therapy: WFL for tasks assessed/performed Overall Cognitive Status: Within Functional Limits for tasks assessed                                                     Pertinent Vitals/ Pain       Pain Assessment Pain Assessment: 0-10 Pain Score: 5  Pain Location: L hip Pain Descriptors / Indicators: Dull, Discomfort Pain Intervention(s): Limited activity within patient's tolerance, Repositioned, Patient requesting pain meds-RN notified   Frequency  Min 2X/week  Progress Toward Goals  OT Goals(current goals can now be found in the care plan section)  Progress towards OT goals: Progressing toward goals  Acute Rehab OT Goals Patient Stated Goal: to go home OT Goal Formulation: With patient Time For Goal Achievement: 12/20/22 Potential to Achieve Goals: Good ADL Goals Pt Will Perform Grooming: with modified independence;standing Pt Will Perform Lower Body Dressing: with modified independence;sit to/from stand Pt Will Transfer to Toilet: with modified independence;ambulating;regular height toilet  Plan Discharge plan remains appropriate;Frequency remains appropriate    Co-evaluation                 AM-PAC OT "6 Clicks" Daily Activity      Outcome Measure   Help from another person eating meals?: None Help from another person taking care of personal grooming?: A Little Help from another person toileting, which includes using toliet, bedpan, or urinal?: A Lot Help from another person bathing (including washing, rinsing, drying)?: A Lot Help from another person to put on and taking off regular upper body clothing?: A Little Help from another person to put on and taking off regular lower body clothing?: A Little 6 Click Score: 17    End of Session Equipment Utilized During Treatment: Gait belt;Rolling walker (2 wheels)  OT Visit Diagnosis: Unsteadiness on feet (R26.81);Repeated falls (R29.6)   Activity Tolerance Patient tolerated treatment well   Patient Left in chair;with call bell/phone within reach;with chair alarm set;with family/visitor present;with nursing/sitter in room   Nurse Communication Mobility status;Patient requests pain meds        Time: 1046-1106 OT Time Calculation (min): 20 min  Charges: OT General Charges $OT Visit: 1 Visit OT Treatments $Self Care/Home Management : 8-22 mins  Kathie Dike, M.S. OTR/L  12/08/22, 12:06 PM  ascom [phone]

## 2022-12-08 NOTE — Progress Notes (Signed)
PT Contact Note     Durable Medical Equipment  (From admission, onward)           Start     Ordered   12/08/22 0900  For home use only DME Walker rolling  Once       Question Answer Comment  Walker: With 5 Inch Wheels   Patient needs a walker to treat with the following condition Generalized weakness      12/08/22 0859   12/08/22 0900  For home use only DME Bedside commode  Once       Question:  Patient needs a bedside commode to treat with the following condition  Answer:  General weakness   12/08/22 0859            Patient is confined to a single room and not able to walk the distance required to go the bathroom, or he/she is unable to safely negotiate stairs required to access the bathroom.  A 3in1 BSC will alleviate this problem   Wenceslaus Gist H. Manson Passey, PT, DPT, NCS 12/08/22, 9:00 AM [phone]

## 2022-12-09 DIAGNOSIS — S72002A Fracture of unspecified part of neck of left femur, initial encounter for closed fracture: Secondary | ICD-10-CM | POA: Diagnosis not present

## 2022-12-09 DIAGNOSIS — N183 Chronic kidney disease, stage 3 unspecified: Secondary | ICD-10-CM | POA: Diagnosis not present

## 2022-12-09 DIAGNOSIS — W19XXXA Unspecified fall, initial encounter: Secondary | ICD-10-CM | POA: Diagnosis not present

## 2022-12-09 DIAGNOSIS — I10 Essential (primary) hypertension: Secondary | ICD-10-CM | POA: Diagnosis not present

## 2022-12-09 LAB — GLUCOSE, CAPILLARY
Glucose-Capillary: 153 mg/dL — ABNORMAL HIGH (ref 70–99)
Glucose-Capillary: 161 mg/dL — ABNORMAL HIGH (ref 70–99)
Glucose-Capillary: 164 mg/dL — ABNORMAL HIGH (ref 70–99)
Glucose-Capillary: 109 mg/dL — ABNORMAL HIGH (ref 70–99)

## 2022-12-09 LAB — BASIC METABOLIC PANEL
Anion gap: 4 — ABNORMAL LOW (ref 5–15)
BUN: 32 mg/dL — ABNORMAL HIGH (ref 8–23)
CO2: 25 mmol/L (ref 22–32)
Calcium: 8.1 mg/dL — ABNORMAL LOW (ref 8.9–10.3)
Chloride: 109 mmol/L (ref 98–111)
Creatinine, Ser: 1.24 mg/dL — ABNORMAL HIGH (ref 0.44–1.00)
GFR, Estimated: 46 mL/min — ABNORMAL LOW (ref >60)
Glucose, Bld: 145 mg/dL — ABNORMAL HIGH (ref 70–99)
Potassium: 3.9 mmol/L (ref 3.5–5.1)
Sodium: 138 mmol/L (ref 135–145)

## 2022-12-09 LAB — IRON AND TIBC
Iron: 26 ug/dL — ABNORMAL LOW (ref 28–170)
Saturation Ratios: 18 % (ref 10.4–31.8)
TIBC: 148 ug/dL — ABNORMAL LOW (ref 250–450)
UIBC: 122 ug/dL

## 2022-12-09 LAB — CBC
HCT: 22.1 % — ABNORMAL LOW (ref 36.0–46.0)
Hemoglobin: 7.4 g/dL — ABNORMAL LOW (ref 12.0–15.0)
MCH: 35.4 pg — ABNORMAL HIGH (ref 26.0–34.0)
MCHC: 33.5 g/dL (ref 30.0–36.0)
MCV: 105.7 fL — ABNORMAL HIGH (ref 80.0–100.0)
Platelets: 232 10*3/uL (ref 150–400)
RBC: 2.09 MIL/uL — ABNORMAL LOW (ref 3.87–5.11)
RDW: 13.4 % (ref 11.5–15.5)
WBC: 6.2 10*3/uL (ref 4.0–10.5)
nRBC: 0 % (ref 0.0–0.2)

## 2022-12-09 LAB — FERRITIN: Ferritin: 167 ng/mL (ref 11–307)

## 2022-12-09 MED ORDER — ENOXAPARIN SODIUM 60 MG/0.6ML IJ SOSY
0.5000 mg/kg | PREFILLED_SYRINGE | INTRAMUSCULAR | Status: DC
  Administered 2022-12-09 – 2022-12-10 (×2): 50 mg via SUBCUTANEOUS
  Filled 2022-12-09 (×2): qty 0.6

## 2022-12-09 MED ORDER — FE FUM-VIT C-VIT B12-FA 460-60-0.01-1 MG PO CAPS
1.0000 | ORAL_CAPSULE | Freq: Every day | ORAL | Status: DC
  Administered 2022-12-09 – 2022-12-11 (×3): 1 via ORAL
  Filled 2022-12-09 (×3): qty 1

## 2022-12-09 NOTE — Progress Notes (Signed)
PHARMACIST - PHYSICIAN COMMUNICATION  CONCERNING:  Enoxaparin (Lovenox) for DVT Prophylaxis   ASSESSMENT: Patient was prescribed enoxaparin 40 mg subcutaneously every 24 hours for VTE prophylaxis.   Body mass index is 35.83 kg/m.  Estimated Creatinine Clearance: 48.4 mL/min (A) (by C-G formula based on SCr of 1.24 mg/dL (H)).  Based on [facility] policy, patient is candidate for enoxaparin dosing of 0.5 mg per kilogram of total body weight every 24 hours based on body mass index being >30 kg/m^2.  RECOMMENDATION: Pharmacy has adjusted enoxaparin dose per [facility] policy.  DESCRIPTION: Patient is now receiving enoxaparin 0.5 mg/kg subcutaneously every 24 hours.   Will M. Dareen Piano, PharmD PGY-1 Pharmacy Resident 12/09/2022 2:58 PM

## 2022-12-09 NOTE — Progress Notes (Addendum)
Subjective: 4 Days Post-Op Procedure(s) (LRB): INTRAMEDULLARY (IM) NAIL INTERTROCHANTERIC (Left) Patient reports pain as mild in the left leg this morning. Patient is well, and has had no acute complaints or problems. Plan is for SNF placement following discharge Negative for chest pain and shortness of breath Fever: no Gastrointestinal:Negative for nausea and vomiting Reports she is urinating well and has had a BM.  Objective: Vital signs in last 24 hours: Temp:  [97.9 F (36.6 C)-98.6 F (37 C)] 98.2 F (36.8 C) (06/01 0727) Pulse Rate:  [73-91] 73 (06/01 0727) Resp:  [18] 18 (06/01 0727) BP: (133-156)/(59-84) 133/59 (06/01 0727) SpO2:  [96 %-99 %] 98 % (06/01 0727)  Intake/Output from previous day:  Intake/Output Summary (Last 24 hours) at 12/09/2022 0832 Last data filed at 12/09/2022 0736 Gross per 24 hour  Intake 220 ml  Output 1300 ml  Net -1080 ml    Intake/Output this shift: Total I/O In: -  Out: 150 [Urine:150]  Labs: Recent Labs    12/07/22 1639  HGB 8.2*   Recent Labs    12/07/22 1639  WBC 8.0  RBC 2.28*  HCT 24.4*  PLT 232   Recent Labs    12/08/22 0419 12/09/22 0401  NA 138 138  K 4.0 3.9  CL 110 109  CO2 19* 25  BUN 41* 32*  CREATININE 1.40* 1.24*  GLUCOSE 187* 145*  CALCIUM 7.9* 8.1*   No results for input(s): "LABPT", "INR" in the last 72 hours.    EXAM General - Patient is alert and oriented. Extremity - Neurovascular intact Dorsiflexion/Plantar flexion intact Incision: moderate drainage No cellulitis present Compartment soft - homans sign Dressing/Incision - mild drainage, new honeycomb applied Motor Function - intact, moving foot and toes well on exam.    Past Medical History:  Diagnosis Date   Arthritis    Chronic cough    Depression    Diabetes mellitus without complication (HCC)    Edema    LEGS/FEET   GERD (gastroesophageal reflux disease)    NO MEDS   Headache    H/O MIGRAINES   HOH (hard of hearing)     Hypertension    Hypothyroidism    IBS (irritable bowel syndrome)    Sleep apnea    CPAP    Assessment/Plan: 4 Days Post-Op Procedure(s) (LRB): INTRAMEDULLARY (IM) NAIL INTERTROCHANTERIC (Left) Principal Problem:   Hip fracture (HCC) Active Problems:   Hypertension   Type 2 diabetes mellitus (HCC)   Sarcoidosis   Hypothyroidism   Fall at home, initial encounter   CKD (chronic kidney disease) stage 3, GFR 30-59 ml/min (HCC)  Estimated body mass index is 35.83 kg/m as calculated from the following:   Height as of this encounter: 5\' 6"  (1.676 m).   Weight as of this encounter: 100.7 kg. Advance diet Up with therapy  Acute post op blood loss anemia - Hgb 7.4, start Fe. Recheck CBC tomorrow. Patient asymptomatic VSS Pain well controlled CM to assist with discharge to SNF   Following discharge, follow-up with [facility] orthopaedics in 10-14 days for staple removal. Discharge to SNF with Lovenox pending kidney function, continue heparin at this time.  DVT Prophylaxis - TED hose and SCDs and Heparin Weight-Bearing as tolerated to left leg  T. Cranston Neighbor, PA-C [facility] Orthopaedic [facility] 12/09/2022, 8:32 AM   Patient seen and examined, agree with above plan.  The patient is doing well status post left hip IMN. Some post operative anemia, starting iron as noted above will recheck in  morning. Discussed the possibility of blood transfusion with patient if continues to drop H/H or becomes symptomatic.  Pain is controlled.  Discussed DVT prophylaxis, pain medication use, and safe transition to SNF.  All questions answered.   Reinaldo Berber MD

## 2022-12-09 NOTE — Progress Notes (Signed)
Physical Therapy Treatment Patient Details Name: Kaitlin Kelley MRN: [id number] DOB: Jul 30, 1950 Today's Date: 12/09/2022   History of Present Illness Kaitlin Kelley is a 71yoF who comes to [facility] on 12/05/22 after a fall to ground at home related to a hypoglycemic event. PMH: DM, sarcoidosis. Workup revealing of left hip intertrochanteric fracture. Pt went for Left femur ORIF IM nail on 5/28 c Dr Joice Lofts.    PT Comments    Patient received reclining in bed requesting assistance to use the bathroom and agreeable to PT. Patient completed supine to sit with mod I with HOB elevated and increased time. She completed several transfers from various surfaces with supervision and cuing for safe body and AD positioning. She ambulate 15-25 feet with RW and CGA - supervision with cuing for improved gait pattern. She reported some dizziness that started when she reached the bathroom but did not worsen. She was limited in her final ambulation distance by L hip and knee pain. Session ended with patient in chair, alarm set, needs within reach, nursing notified of request for pain medication. Patient demonstrates significant improvement in mobility but still is limited by dizziness, pain, and weakness. Patient would benefit from skilled physical therapy to address impairments and functional limitations (see PT Problem List below) to work towards stated goals and return to PLOF or maximal functional independence.    Recommendations for follow up therapy are one component of a multi-disciplinary discharge planning process, led by the attending physician.  Recommendations may be updated based on patient status, additional functional criteria and insurance authorization.  Follow Up Recommendations       Assistance Recommended at Discharge Intermittent Supervision/Assistance  Patient can return home with the following Assistance with cooking/housework;Help with stairs or ramp for entrance;A little help with walking and/or  transfers;A little help with bathing/dressing/bathroom   Equipment Recommendations  Rolling walker (2 wheels);BSC/3in1    Recommendations for Other Services       Precautions / Restrictions Precautions Precautions: Fall Restrictions Weight Bearing Restrictions: Yes LLE Weight Bearing: Weight bearing as tolerated     Mobility  Bed Mobility Overal bed mobility: Needs Assistance Bed Mobility: Supine to Sit     Supine to sit: HOB elevated, Modified independent (Device/Increase time)          Transfers Overall transfer level: Needs assistance Equipment used: Rolling walker (2 wheels) Transfers: Sit to/from Stand Sit to Stand: Supervision           General transfer comment: patient completed sit <> stand 4 times from varying surfaces (bed, BSC over toilet, and chair) with supervision and cuing for safe positioning of RW and body.    Ambulation/Gait Ambulation/Gait assistance: Supervision, Min guard Gait Distance (Feet): 25 Feet Assistive device: Rolling walker (2 wheels)   Gait velocity: very slow     General Gait Details: Patient ambulated from bed to toilet with BSC placed over and toilet to chair using RW and CGA to supervision. Pateint demonstrated short step length, poor bilateral foot clearance, and heavy B UE support on RW during ambulation. She reported mild dizziness that started when she got to the toilet and did not worsen. She then ambulated approx 15 feet a third time with chair follow with RW and supervision. Pateint was limited in the third ambulation by L hip and knee pain.   Stairs             Wheelchair Mobility    Modified Rankin (Stroke Patients Only)       Balance Overall  balance assessment: Needs assistance Sitting-balance support: No upper extremity supported, Feet supported Sitting balance-Leahy Scale: Good     Standing balance support: During functional activity, Reliant on assistive device for balance, Bilateral upper  extremity supported, No upper extremity supported Standing balance-Leahy Scale: Fair Standing balance comment: able to stand at sink and wash hands, dependent on B UE support with ambulation.                            Cognition Arousal/Alertness: Awake/alert Behavior During Therapy: WFL for tasks assessed/performed Overall Cognitive Status: Within Functional Limits for tasks assessed                                          Exercises Other Exercises Other Exercises: educated patient about safe body and AD positioning while practicing mobility and pericare. Educated patient about intramedually nail surgery to help answer some questions she had about knee pain and the joint being able to move.    General Comments        Pertinent Vitals/Pain Pain Assessment Faces Pain Scale: Hurts whole lot Pain Location: L hip and knee Pain Descriptors / Indicators: Discomfort, Grimacing, Guarding Pain Intervention(s): Limited activity within patient's tolerance, Monitored during session, Repositioned, Patient requesting pain meds-RN notified    Home Living                          Prior Function            PT Goals (current goals can now be found in the care plan section) Acute Rehab PT Goals Patient Stated Goal: regain strength and mobility PT Goal Formulation: With patient Time For Goal Achievement: 12/20/22 Potential to Achieve Goals: Good Progress towards PT goals: Progressing toward goals    Frequency    Min 4X/week      PT Plan Current plan remains appropriate    Co-evaluation              AM-PAC PT "6 Clicks" Mobility   Outcome Measure  Help needed turning from your back to your side while in a flat bed without using bedrails?: A Little Help needed moving from lying on your back to sitting on the side of a flat bed without using bedrails?: A Little Help needed moving to and from a bed to a chair (including a wheelchair)?: A  Little Help needed standing up from a chair using your arms (e.g., wheelchair or bedside chair)?: A Little Help needed to walk in hospital room?: A Little Help needed climbing 3-5 steps with a railing? : Total 6 Click Score: 16    End of Session Equipment Utilized During Treatment: Gait belt Activity Tolerance: Patient limited by pain Patient left: with call bell/phone within reach;in chair;with chair alarm set Nurse Communication: Mobility status;Patient requests pain meds PT Visit Diagnosis: Other abnormalities of gait and mobility (R26.89);Muscle weakness (generalized) (M62.81);Difficulty in walking, not elsewhere classified (R26.2)     Time: 1027-2536 PT Time Calculation (min) (ACUTE ONLY): 25 min  Charges:  $Gait Training: 8-22 mins $Therapeutic Activity: 8-22 mins                     Luretha Murphy. Ilsa Iha, PT, DPT 12/09/22, 5:07 PM

## 2022-12-09 NOTE — TOC Progression Note (Addendum)
Transition of Care [facility]) - Progression Note    Patient Details  Name: Kaitlin Kelley MRN: [id number] Date of Birth: 09-30-1950  [facility] CM/SW Contact  Liliana Cline, LCSW Phone Number: 12/09/2022, 10:41 AM  Clinical Narrative:    CSW reached out to Sue Lush at [facility] to see if she can review referral. Awaiting response.  11:45- Spoke to patient who defers to her son. Spoke to son [name] by phone. He would like to research other facility options. Provided Medicare Care Compare ratings for him to review. He will reply later with preferred SNFs.   Expected Discharge Plan: Skilled Nursing Facility Barriers to Discharge: Continued Medical Work up  Expected Discharge Plan and Services   Discharge Planning Services: CM Consult   Living arrangements for the past 2 months: Single Family Home                 DME Arranged: N/A DME Agency: NA       HH Arranged: NA           Social Determinants of Health (SDOH) Interventions SDOH Screenings   Food Insecurity: No Food Insecurity (12/06/2022)  Housing: Low Risk  (12/06/2022)  Transportation Needs: No Transportation Needs (12/06/2022)  Utilities: Not At Risk (12/06/2022)  Depression (PHQ2-9): High Risk (10/20/2022)  Tobacco Use: Low Risk  (12/06/2022)    Readmission Risk Interventions     No data to display

## 2022-12-09 NOTE — Plan of Care (Signed)

## 2022-12-09 NOTE — Progress Notes (Signed)
PROGRESS NOTE    Kaitlin Kelley   WNU:[id number] DOB: 07-29-1950  DOA: 12/05/2022 Date of Service: 12/09/22 PCP: Elissa Hefty, MD     Brief Narrative / Hospital Course:  Kaitlin Kelley is a 72 y.o. female with medical history significant of hypertension, hypothyroidism, type 2 diabetes, sarcoidosis, Rheumatoid arthritis presenting from home to ED 12/05/22 with fall and left hip fracture. Patient reports feeling her sugar was low overnight, attempted to go to the fridge to get something to eat.  Developed generalized weakness with subsequent fall.   05/28: Left hip plain films with acute intertrochanteric left femur fracture with varus deformity. TO OR for reduction and internal fixation of displaced intertrochanteric left hip fracture with Bi[employer] Affix[employer] TFN nail w/ Dr Joice Lofts. (Note f/u with [facility] orthopaedics in 10-14 days for staple removal 06/07-06/11) 05/29: worsening renal function. Suspect hypovolemia/hypotension related, FeNa = prerenal. IV fluids through today, holding BP meds and other nephrotoxics, follow BMP. Suspect hospital delirium, medication effect but no CT head in ED w/ fall, CT done this evening, no concerns  05/30: AKI improving, not back to baseline, conitnue IV fluids. TOC working on SNF placement.  05/31:Cr at baseline. Placement pending.  06/01: stable  Consultants:  orthopedics  Procedures: 12/05/2022 L intramedullary intertrochanteric nail fixation of displaced intertrochanteric left hip fracture         ASSESSMENT & PLAN:   Principal Problem:   Hip fracture (HCC) Active Problems:   Hypertension   Type 2 diabetes mellitus (HCC)   Sarcoidosis   Hypothyroidism   Fall at home, initial encounter   CKD (chronic kidney disease) stage 3, GFR 30-59 ml/min (HCC)   Hip fracture (HCC) Fall at home, initial encounter intertrochanteric left femur fracture with varus deformity status post mechanical fall POD4 s/p nail repair  Advance diet Up with  therapy, WBAT to LLE PT/OT recs for SNF rehab, TOC following   AKI (acute kidney injury) (HCC) - improving  CKD (chronic kidney disease) stage 3a, GFR 30-59 ml/min (HCC) Creatinine 1.3 initially with GFR 45, worsening yesterday to Cr 2.15 and GFR 24 --> today Cr 1.87 and GFR 28 FeNa is prerenal and Cr is improving w/ fluids  Lovenox --> heparin Holding ARB Reduced gabapentin Holding NSAID I&O - good UOP Follow BMP  Confusion - improved Per son, new yesterday  Suspect hospital delirium, medication effect but no CT head in ED w/ fall, obtained CT 05/29 no concerns  No concerns on neuro exam  Monitor   Anemia Post-op frop in Hgb but baseline anemia w/ macrocytosis  Normal B12 and folate  Monitor CBC while here  Follow outpatient  Iron supplementation   Hypertension BP stable Titrate home regimen Holding nephrotoxins in setting of AKI  Type 2 diabetes mellitus (HCC) SSI ac hs + basal   Sarcoidosis Appears stable Chest x-ray within normal limits Continue home regimen   Hypothyroidism Continue Synthroid   Fever blister to lower lip Valtrex     DVT prophylaxis: heparin, SCD, TED hose Pertinent IV fluids/nutrition: no continuous IV fluids  Central lines / invasive devices: none  Code Status: FULL CODE ACP documentation reviewed: 12/06/22, none on file VYNCA   Current Admission Status: inpatient  TOC needs / Dispo plan: pending PT/OT recs may need SNF/HH Barriers to discharge / significant pending items: AKI, placement, anticipate can discharge tomorrow if renal function appropriate and placement can be arranged              Subjective / Brief ROS:  Patient  resting comfortably, no concerns    Family Communication: TOC in communication w/ son re: placement     Objective Findings:  Vitals:   12/08/22 0800 12/08/22 1628 12/08/22 2137 12/09/22 0727  BP: (!) 149/65 (!) 156/84 (!) 153/59 (!) 133/59  Pulse: 73 91 80 73  Resp: 18 18 18 18   Temp: 98.3  F (36.8 C) 97.9 F (36.6 C) 98.6 F (37 C) 98.2 F (36.8 C)  TempSrc:      SpO2: 99% 96% 99% 98%  Weight:      Height:        Intake/Output Summary (Last 24 hours) at 12/09/2022 1454 Last data filed at 12/09/2022 1215 Gross per 24 hour  Intake 220 ml  Output 1050 ml  Net -830 ml   Filed Weights   12/05/22 0334 12/05/22 0655  Weight: 102.1 kg 100.7 kg    Examination:  Physical Exam Constitutional:      General: She is not in acute distress.    Appearance: She is obese.  Cardiovascular:     Rate and Rhythm: Normal rate and regular rhythm.     Heart sounds: Normal heart sounds.  Pulmonary:     Effort: Pulmonary effort is normal.     Breath sounds: Normal breath sounds.  Abdominal:     Palpations: Abdomen is soft.     Tenderness: There is no abdominal tenderness.  Neurological:     Cranial Nerves: No dysarthria or facial asymmetry.     Motor: No tremor, abnormal muscle tone or pronator drift.     Coordination: Coordination is intact.          Scheduled Medications:   calcium-vitamin D  1 tablet Oral Q breakfast   docusate sodium  100 mg Oral BID   DULoxetine  60 mg Oral BID   Fe Fum-Vit C-Vit B12-FA  1 capsule Oral QPC breakfast   gabapentin  300 mg Oral BID   heparin injection (subcutaneous)  5,000 Units Subcutaneous Q8H   insulin aspart  0-15 Units Subcutaneous TID WC   insulin aspart  4 Units Subcutaneous TID WC   insulin glargine-yfgn  10 Units Subcutaneous QHS   levothyroxine  75 mcg Oral Q0600   mirtazapine  15 mg Oral QHS   rosuvastatin  40 mg Oral Daily   sulfaSALAzine  1,000 mg Oral BID    Continuous Infusions:    PRN Medications:  acetaminophen, bisacodyl, diphenhydrAMINE, HYDROcodone-acetaminophen, magnesium hydroxide, metoCLOPramide **OR** metoCLOPramide (REGLAN) injection, ondansetron **OR** ondansetron (ZOFRAN) IV, sodium phosphate  Antimicrobials from admission:  Anti-infectives (From admission, onward)    Start     Dose/Rate Route  Frequency Ordered Stop   12/07/22 1100  valACYclovir (VALTREX) tablet 2,000 mg        2,000 mg Oral 2 times daily 12/07/22 1005 12/07/22 2205   12/05/22 2000  ceFAZolin (ANCEF) IVPB 2g/100 mL premix        2 g 200 mL/hr over 30 Minutes Intravenous Every 8 hours 12/05/22 1601 12/06/22 0448   12/05/22 0512  ceFAZolin (ANCEF) IVPB 2g/100 mL premix        2 g 200 mL/hr over 30 Minutes Intravenous 30 min pre-op 12/05/22 9604 12/05/22 1226           Data Reviewed:  I have personally reviewed the following...  CBC: Recent Labs  Lab 12/05/22 0342 12/06/22 0713 12/07/22 1639 12/09/22 0358  WBC 6.3 8.6 8.0 6.2  NEUTROABS 4.0  --   --   --   HGB 12.6  9.2* 8.2* 7.4*  HCT 37.1 27.5* 24.4* 22.1*  MCV 103.1* 106.2* 107.0* 105.7*  PLT 302 280 232 232   Basic Metabolic Panel: Recent Labs  Lab 12/05/22 0342 12/06/22 0551 12/07/22 0703 12/08/22 0419 12/09/22 0401  NA 137 134* 138 138 138  K 3.5 4.6 4.1 4.0 3.9  CL 102 104 108 110 109  CO2 20* 20* 21* 19* 25  GLUCOSE 179* 168* 186* 187* 145*  BUN 22 37* 49* 41* 32*  CREATININE 1.27* 2.15* 1.87* 1.40* 1.24*  CALCIUM 8.9 8.3* 7.8* 7.9* 8.1*   GFR: Estimated Creatinine Clearance: 48.4 mL/min (A) (by C-G formula based on SCr of 1.24 mg/dL (H)). Liver Function Tests: Recent Labs  Lab 12/06/22 0551  AST 21  ALT 12  ALKPHOS 52  BILITOT 0.3  PROT 5.0*  ALBUMIN 2.8*   No results for input(s): "LIPASE", "AMYLASE" in the last 168 hours. No results for input(s): "AMMONIA" in the last 168 hours. Coagulation Profile: Recent Labs  Lab 12/05/22 0342  INR 1.0   Cardiac Enzymes: No results for input(s): "CKTOTAL", "CKMB", "CKMBINDEX", "TROPONINI" in the last 168 hours. BNP (last 3 results) No results for input(s): "PROBNP" in the last 8760 hours. HbA1C: No results for input(s): "HGBA1C" in the last 72 hours.  CBG: Recent Labs  Lab 12/08/22 1154 12/08/22 1629 12/08/22 2132 12/09/22 0736 12/09/22 1147  GLUCAP 189* 137*  110* 153* 161*   Lipid Profile: No results for input(s): "CHOL", "HDL", "LDLCALC", "TRIG", "CHOLHDL", "LDLDIRECT" in the last 72 hours. Thyroid Function Tests: Recent Labs    12/07/22 1639  TSH 0.744   Anemia Panel: Recent Labs    12/07/22 1639 12/09/22 0358  VITAMINB12 638  --   FERRITIN  --  167  TIBC  --  148*  IRON  --  26*   Most Recent Urinalysis On File:     Component Value Date/Time   COLORURINE YELLOW 02/24/2018 1435   APPEARANCEUR CLOUDY (A) 02/24/2018 1435   LABSPEC 1.020 02/24/2018 1435   PHURINE 5.0 02/24/2018 1435   GLUCOSEU 500 (A) 02/24/2018 1435   HGBUR TRACE (A) 02/24/2018 1435   BILIRUBINUR SMALL (A) 02/24/2018 1435   KETONESUR TRACE (A) 02/24/2018 1435   PROTEINUR 100 (A) 02/24/2018 1435   NITRITE NEGATIVE 02/24/2018 1435   LEUKOCYTESUR NEGATIVE 02/24/2018 1435   Sepsis Labs: @LABRCNTIP (procalcitonin:4,lacticidven:4) Microbiology: No results found for this or any previous visit (from the past 240 hour(s)).    Radiology Studies last 3 days: CT HEAD WO CONTRAST ( )  Result Date: 12/06/2022 CLINICAL DATA:  Head trauma EXAM: CT HEAD WITHOUT CONTRAST TECHNIQUE: Contiguous axial images were obtained from the base of the skull through the vertex without intravenous contrast. RADIATION DOSE REDUCTION: This exam was performed according to the departmental dose-optimization program which includes automated exposure control, adjustment of the mA and/or kV according to patient size and/or use of iterative reconstruction technique. COMPARISON:  12/06/2009 FINDINGS: Brain: No evidence of acute infarction, hemorrhage, mass, mass effect, or midline shift. No hydrocephalus or extra-axial fluid collection. Vascular: No hyperdense vessel. Skull: Negative for fracture or focal lesion. Sinuses/Orbits: No acute finding. Status post bilateral lens replacements. Other: The mastoid air cells are well aerated. IMPRESSION: No acute intracranial process. Electronically Signed    By: Wiliam Ke M.D.   On: 12/06/2022 20:43             LOS: 4 days       Sunnie Nielsen, DO [facility] 12/09/2022, 2:54 PM    Dictation  software may have been used to generate the above note. Typos may occur and escape review in typed/dictated notes. Please contact Dr Lyn Hollingshead directly for clarity if needed.  Staff may message me via secure chat in Epic  but this may not receive an immediate response,  please page me for urgent matters!  If 7PM-7AM, please contact night coverage [email or web address]

## 2022-12-10 DIAGNOSIS — S72002A Fracture of unspecified part of neck of left femur, initial encounter for closed fracture: Secondary | ICD-10-CM | POA: Diagnosis not present

## 2022-12-10 DIAGNOSIS — N183 Chronic kidney disease, stage 3 unspecified: Secondary | ICD-10-CM | POA: Diagnosis not present

## 2022-12-10 DIAGNOSIS — I10 Essential (primary) hypertension: Secondary | ICD-10-CM | POA: Diagnosis not present

## 2022-12-10 DIAGNOSIS — W19XXXA Unspecified fall, initial encounter: Secondary | ICD-10-CM | POA: Diagnosis not present

## 2022-12-10 LAB — CBC
HCT: 22 % — ABNORMAL LOW (ref 36.0–46.0)
Hemoglobin: 7.4 g/dL — ABNORMAL LOW (ref 12.0–15.0)
MCH: 35.7 pg — ABNORMAL HIGH (ref 26.0–34.0)
MCHC: 33.6 g/dL (ref 30.0–36.0)
MCV: 106.3 fL — ABNORMAL HIGH (ref 80.0–100.0)
Platelets: 271 10*3/uL (ref 150–400)
RBC: 2.07 MIL/uL — ABNORMAL LOW (ref 3.87–5.11)
RDW: 13.5 % (ref 11.5–15.5)
WBC: 5.5 10*3/uL (ref 4.0–10.5)
nRBC: 0 % (ref 0.0–0.2)

## 2022-12-10 LAB — GLUCOSE, CAPILLARY
Glucose-Capillary: 136 mg/dL — ABNORMAL HIGH (ref 70–99)
Glucose-Capillary: 268 mg/dL — ABNORMAL HIGH (ref 70–99)
Glucose-Capillary: 150 mg/dL — ABNORMAL HIGH (ref 70–99)
Glucose-Capillary: 208 mg/dL — ABNORMAL HIGH (ref 70–99)

## 2022-12-10 MED ORDER — BRIMONIDINE TARTRATE 0.15 % OP SOLN: 1.0000 [drp] | Freq: Three times a day (TID) | OPHTHALMIC | Status: DC

## 2022-12-10 MED ORDER — ACYCLOVIR 5 % EX OINT: TOPICAL_OINTMENT | CUTANEOUS | Status: DC

## 2022-12-10 MED ORDER — LIDOCAINE VISCOUS HCL 2 % MT SOLN
15.0000 mL | OROMUCOSAL | Status: DC | PRN
  Administered 2022-12-10: 15 mL via OROMUCOSAL
  Filled 2022-12-10 (×5): qty 15

## 2022-12-10 MED ORDER — ARTIFICIAL TEARS OPHTHALMIC OINT
TOPICAL_OINTMENT | OPHTHALMIC | Status: DC | PRN
  Administered 2022-12-10: 1 via OPHTHALMIC
  Filled 2022-12-10: qty 3.5

## 2022-12-10 NOTE — Hospital Course (Addendum)
Kaitlin Kelley is a 72 y.o. female with medical history significant of hypertension, hypothyroidism, type 2 diabetes, sarcoidosis, Rheumatoid arthritis presenting from home to ED 12/05/22 with fall and left hip fracture. Patient reports feeling her sugar was low overnight, attempted to go to the fridge to get something to eat.  Developed generalized weakness with subsequent fall.   05/28: Left hip plain films with acute intertrochanteric left femur fracture with varus deformity. TO OR for reduction and internal fixation of displaced intertrochanteric left hip fracture with Biomet Affixis TFN nail w/ Dr Joice Lofts. (Note f/u with [facility] orthopaedics in 10-14 days for staple removal 06/07-06/11) 05/29: worsening renal function. Suspect hypovolemia/hypotension related, FeNa = prerenal. IV fluids through today, holding BP meds and other nephrotoxics, follow BMP. Suspect hospital delirium, medication effect but no CT head in ED w/ fall, CT done this evening, no concerns  05/30: AKI improving, not back to baseline, conitnue IV fluids. TOC working on SNF placement.  05/31:Cr at baseline. Placement pending.  06/01-06/02: stable   Consultants:  orthopedics   Procedures: 12/05/2022 L intramedullary intertrochanteric nail fixation of displaced intertrochanteric left hip fracture                 ASSESSMENT & PLAN:   Principal Problem:   Hip fracture (HCC) Active Problems:   Hypertension   Type 2 diabetes mellitus (HCC)   Sarcoidosis   Hypothyroidism   Fall at home, initial encounter   CKD (chronic kidney disease) stage 3, GFR 30-59 ml/min (HCC)     Hip fracture (HCC) Fall at home, initial encounter intertrochanteric left femur fracture with varus deformity status post mechanical fall POD5 s/p nail repair  Up with therapy, WBAT to LLE PT/OT recs for SNF rehab, TOC following    AKI (acute kidney injury) (HCC) - improving  CKD (chronic kidney disease) stage 3a, GFR 30-59 ml/min (HCC) Creatinine  1.3 initially with GFR 45, worsening yesterday to Cr 2.15 and GFR 24 --> today Cr 1.87 and GFR 28 FeNa was prerenal and Cr improved w/ fluids  Holding ARB Reduced gabapentin Holding NSAID I&O - good UOP Follow BMP   Confusion - improved Per son, new yesterday  Suspect hospital delirium, medication effect but no CT head in ED w/ fall, obtained CT 05/29 no concerns  No concerns on neuro exam  Monitor    Anemia Post-op frop in Hgb but baseline anemia w/ macrocytosis  Normal B12 and folate  Monitor CBC while here  Follow outpatient  Iron supplementation  Transfuse if <7   Hypertension BP stable Titrate home regimen Holding nephrotoxins in setting of AKI   Type 2 diabetes mellitus (HCC) SSI ac hs + basal    Sarcoidosis Appears stable Chest x-ray within normal limits Continue home regimen    Hypothyroidism Continue Synthroid    Fever blister to lower lip vs benign aphthous ulcer  S/p burst dose Valtrex  Viscous lidocaine as needed    Dry eyes Lubricating ointment prn    DVT prophylaxis: heparin, SCD, TED hose Pertinent IV fluids/nutrition: no continuous IV fluids  Central lines / invasive devices: none   Code Status: FULL CODE ACP documentation reviewed: 12/06/22, none on file VYNCA    Current Admission Status: inpatient  TOC needs / Dispo plan: SNF Barriers to discharge / significant pending items: placement

## 2022-12-10 NOTE — Plan of Care (Signed)

## 2022-12-10 NOTE — Progress Notes (Signed)
Physical Therapy Treatment Patient Details Name: Kaitlin Kelley MRN: [id number] DOB: [birth year] Today's Date: 12/10/2022   History of Present Illness Kaitlin Kelley is a 71yoF who comes to [facility] on 12/05/22 after a fall to ground at home related to a hypoglycemic event. PMH: DM, sarcoidosis. Workup revealing of left hip intertrochanteric fracture. Pt went for Left femur ORIF IM nail on 5/28 c Dr Joice Lofts.    PT Comments    Able to et OOB with rail and supervision.  Steady in sitting.  Stated she has been up to bathroom earlier today with staff without dizziness.  She is able to walk in/out of bathroom with 3/10 dizziness.  BP 136/66 P 108 upon return to recliner.  HgB is 7.4 this am.  Seated AROM x 10 with some assist for hip flexion.  Fatigued with activity but remains up in chair.   Recommendations for follow up therapy are one component of a multi-disciplinary discharge planning process, led by the attending physician.  Recommendations may be updated based on patient status, additional functional criteria and insurance authorization.  Follow Up Recommendations       Assistance Recommended at Discharge Intermittent Supervision/Assistance  Patient can return home with the following Assistance with cooking/housework;Help with stairs or ramp for entrance;A little help with walking and/or transfers;A little help with bathing/dressing/bathroom   Equipment Recommendations  Rolling walker (2 wheels);BSC/3in1    Recommendations for Other Services       Precautions / Restrictions Precautions Precautions: Fall Restrictions Weight Bearing Restrictions: Yes LLE Weight Bearing: Weight bearing as tolerated     Mobility  Bed Mobility Overal bed mobility: Needs Assistance Bed Mobility: Supine to Sit     Supine to sit: Supervision, HOB elevated       Patient Response: Cooperative  Transfers Overall transfer level: Needs assistance Equipment used: Rolling walker (2 wheels) Transfers:  Sit to/from Stand Sit to Stand: Supervision                Ambulation/Gait Ambulation/Gait assistance: Supervision, Min guard Gait Distance (Feet): 25 Feet Assistive device: Rolling walker (2 wheels) Gait Pattern/deviations: Step-to pattern, Step-through pattern Gait velocity: decreased     General Gait Details: to/fro bathroom limited by dizziness   Stairs             Wheelchair Mobility    Modified Rankin (Stroke Patients Only)       Balance Overall balance assessment: Needs assistance Sitting-balance support: No upper extremity supported, Feet supported Sitting balance-Leahy Scale: Good     Standing balance support: During functional activity, Reliant on assistive device for balance, Bilateral upper extremity supported, No upper extremity supported Standing balance-Leahy Scale: Fair                              Cognition Arousal/Alertness: Awake/alert Behavior During Therapy: WFL for tasks assessed/performed Overall Cognitive Status: Within Functional Limits for tasks assessed                                          Exercises Other Exercises Other Exercises: seated AROM - AAROM for hip flexion in reclined position    General Comments        Pertinent Vitals/Pain Pain Assessment Pain Assessment: Faces Faces Pain Scale: Hurts little more Pain Location: L hip and knee Pain Descriptors / Indicators: Discomfort, Grimacing, Guarding Pain Intervention(s):  Limited activity within patient's tolerance, Monitored during session, Repositioned    Home Living                          Prior Function            PT Goals (current goals can now be found in the care plan section) Progress towards PT goals: Progressing toward goals    Frequency    Min 4X/week      PT Plan Current plan remains appropriate    Co-evaluation              AM-PAC PT "6 Clicks" Mobility   Outcome Measure  Help needed  turning from your back to your side while in a flat bed without using bedrails?: A Little Help needed moving from lying on your back to sitting on the side of a flat bed without using bedrails?: A Little Help needed moving to and from a bed to a chair (including a wheelchair)?: A Little Help needed standing up from a chair using your arms (e.g., wheelchair or bedside chair)?: A Little Help needed to walk in hospital room?: A Little Help needed climbing 3-5 steps with a railing? : A Lot 6 Click Score: 17    End of Session Equipment Utilized During Treatment: Gait belt Activity Tolerance: Patient limited by pain Patient left: with call bell/phone within reach;in chair;with chair alarm set Nurse Communication: Mobility status;Patient requests pain meds PT Visit Diagnosis: Other abnormalities of gait and mobility (R26.89);Muscle weakness (generalized) (M62.81);Difficulty in walking, not elsewhere classified (R26.2)     Time: 1610-9604 PT Time Calculation (min) (ACUTE ONLY): 21 min  Charges:  $Gait Training: 8-22 mins                   Danielle Dess, PTA 12/10/22, 1:12 PM

## 2022-12-10 NOTE — TOC Progression Note (Addendum)
Transition of Care [facility]) - Progression Note    Patient Details  Name: Kaitlin Kelley MRN: [id number] Date of Birth: 1950-11-18  Transition of Care [facility]) CM/SW Contact  Liliana Cline, LCSW Phone Number: 12/10/2022, 10:47 AM  Clinical Narrative:    Patient's son requested referral be sent to [facility] and [facility] in [place]. He states they do not want [employer].  Referral sent to [facility] and [facility]. Also reached out to [facility] in Admissions at [facility] and asked her to review referral. Called [facility], they stated their Admissions does not work on weekends and advised to call tomorrow to Tammy in Admissions at [phone] ext [phone].    Expected Discharge Plan: Skilled Nursing Facility Barriers to Discharge: Continued Medical Work up  Expected Discharge Plan and Services   Discharge Planning Services: CM Consult   Living arrangements for the past 2 months: Single Family Home                 DME Arranged: N/A DME Agency: NA       HH Arranged: NA           Social Determinants of Health (SDOH) Interventions SDOH Screenings   Food Insecurity: No Food Insecurity (12/06/2022)  Housing: Low Risk  (12/06/2022)  Transportation Needs: No Transportation Needs (12/06/2022)  Utilities: Not At Risk (12/06/2022)  Depression (PHQ2-9): High Risk (10/20/2022)  Tobacco Use: Low Risk  (12/06/2022)    Readmission Risk Interventions     No data to display

## 2022-12-10 NOTE — Progress Notes (Addendum)
Subjective: 5 Days Post-Op Procedure(s) (LRB): INTRAMEDULLARY (IM) NAIL INTERTROCHANTERIC (Left) Patient reports pain as mild in the left leg this morning. Patient is well, and has had no acute complaints or problems. Plan is for SNF placement following discharge Negative for chest pain and shortness of breath Fever: no Gastrointestinal:Negative for nausea and vomiting Reports she is urinating well and has had a BM.  Objective: Vital signs in last 24 hours: Temp:  [97.4 F (36.3 C)-98.3 F (36.8 C)] 98 F (36.7 C) (06/02 0810) Pulse Rate:  [70-80] 73 (06/02 0810) Resp:  [16-18] 18 (06/02 0810) BP: (142-153)/(52-63) 144/52 (06/02 0810) SpO2:  [96 %-98 %] 98 % (06/02 0810)  Intake/Output from previous day:  Intake/Output Summary (Last 24 hours) at 12/10/2022 1019 Last data filed at 12/10/2022 0634 Gross per 24 hour  Intake --  Output 1550 ml  Net -1550 ml    Intake/Output this shift: No intake/output data recorded.  Labs: Recent Labs    12/07/22 1639 12/09/22 0358 12/10/22 0316  HGB 8.2* 7.4* 7.4*   Recent Labs    12/09/22 0358 12/10/22 0316  WBC 6.2 5.5  RBC 2.09* 2.07*  HCT 22.1* 22.0*  PLT 232 271   Recent Labs    12/08/22 0419 12/09/22 0401  NA 138 138  K 4.0 3.9  CL 110 109  CO2 19* 25  BUN 41* 32*  CREATININE 1.40* 1.24*  GLUCOSE 187* 145*  CALCIUM 7.9* 8.1*   No results for input(s): "LABPT", "INR" in the last 72 hours.    EXAM General - Patient is alert and oriented. Extremity - Neurovascular intact Dorsiflexion/Plantar flexion intact Incision: moderate drainage No cellulitis present Compartment soft - homans sign Dressing/Incision - mild drainage, new honeycomb applied today Motor Function - intact, moving foot and toes well on exam.    Past Medical History:  Diagnosis Date   Arthritis    Chronic cough    Depression    Diabetes mellitus without complication (HCC)    Edema    LEGS/FEET   GERD (gastroesophageal reflux disease)     NO MEDS   Headache    H/O MIGRAINES   HOH (hard of hearing)    Hypertension    Hypothyroidism    IBS (irritable bowel syndrome)    Sleep apnea    CPAP    Assessment/Plan: 5 Days Post-Op Procedure(s) (LRB): INTRAMEDULLARY (IM) NAIL INTERTROCHANTERIC (Left) Principal Problem:   Hip fracture (HCC) Active Problems:   Hypertension   Type 2 diabetes mellitus (HCC)   Sarcoidosis   Hypothyroidism   Fall at home, initial encounter   CKD (chronic kidney disease) stage 3, GFR 30-59 ml/min (HCC)  Estimated body mass index is 35.83 kg/m as calculated from the following:   Height as of this encounter: 5\' 6"  (1.676 m).   Weight as of this encounter: 100.7 kg. Advance diet Up with therapy  Acute post op blood loss anemia - Hgb 7.4, continue Fe. Recheck CBC tomorrow.  VSS Pain well controlled CM to assist with discharge to SNF monday   Following discharge, follow-up with [facility] orthopaedics in 10-14 days for staple removal. Discharge to SNF with Lovenox 40 mg SQ daily x 14 days  DVT Prophylaxis - Lovenox TEDs SCDs Weight-Bearing as tolerated to left leg  T. Cranston Neighbor, PA-C [facility] Orthopaedic Surgery 12/10/2022, 10:19 AM   Patient seen and examined, agree with above plan.  The patient is doing well status post left hip intramedullary nail, no concerns at this time. Labs table  from yesterday, continue FE will recheck CBC.  Pain is controlled.  Discussed DVT prophylaxis, pain medication use, and safe transition to SNF.  All questions answered the patient agrees with above plan.   Reinaldo Berber MD

## 2022-12-10 NOTE — Progress Notes (Signed)
PROGRESS NOTE    Kaitlin Kelley   ZOX:[id number] DOB: 1951-03-03  DOA: 12/05/2022 Date of Service: 12/10/22 PCP: Elissa Hefty, MD     Brief Narrative / Hospital Course:  Kaitlin Kelley is a 72 y.o. female with medical history significant of hypertension, hypothyroidism, type 2 diabetes, sarcoidosis, Rheumatoid arthritis presenting from home to ED 12/05/22 with fall and left hip fracture. Patient reports feeling her sugar was low overnight, attempted to go to the fridge to get something to eat.  Developed generalized weakness with subsequent fall.   05/28: Left hip plain films with acute intertrochanteric left femur fracture with varus deformity. TO OR for reduction and internal fixation of displaced intertrochanteric left hip fracture with [employer] TFN nail w/ Dr Joice Lofts. (Note f/u with [facility] orthopaedics in 10-14 days for staple removal 06/07-06/11) 05/29: worsening renal function. Suspect hypovolemia/hypotension related, FeNa = prerenal. IV fluids through today, holding BP meds and other nephrotoxics, follow BMP. Suspect hospital delirium, medication effect but no CT head in ED w/ fall, CT done this evening, no concerns  05/30: AKI improving, not back to baseline, conitnue IV fluids. TOC working on SNF placement.  05/31:Cr at baseline. Placement pending.  06/01-06/02: stable   Consultants:  orthopedics   Procedures: 12/05/2022 L intramedullary intertrochanteric nail fixation of displaced intertrochanteric left hip fracture                 ASSESSMENT & PLAN:   Principal Problem:   Hip fracture (HCC) Active Problems:   Hypertension   Type 2 diabetes mellitus (HCC)   Sarcoidosis   Hypothyroidism   Fall at home, initial encounter   CKD (chronic kidney disease) stage 3, GFR 30-59 ml/min (HCC)     Hip fracture (HCC) Fall at home, initial encounter intertrochanteric left femur fracture with varus deformity status post mechanical fall POD5 s/p nail repair  Up with  therapy, WBAT to LLE PT/OT recs for SNF rehab, TOC following    AKI (acute kidney injury) (HCC) - improving  CKD (chronic kidney disease) stage 3a, GFR 30-59 ml/min (HCC) Creatinine 1.3 initially with GFR 45, worsening yesterday to Cr 2.15 and GFR 24 --> today Cr 1.87 and GFR 28 FeNa was prerenal and Cr improved w/ fluids  Holding ARB Reduced gabapentin Holding NSAID I&O - good UOP Follow BMP   Confusion - improved Per son, new yesterday  Suspect hospital delirium, medication effect but no CT head in ED w/ fall, obtained CT 05/29 no concerns  No concerns on neuro exam  Monitor    Anemia Post-op frop in Hgb but baseline anemia w/ macrocytosis  Normal B12 and folate  Monitor CBC while here  Follow outpatient  Iron supplementation  Transfuse if <7   Hypertension BP stable Titrate home regimen Holding nephrotoxins in setting of AKI   Type 2 diabetes mellitus (HCC) SSI ac hs + basal    Sarcoidosis Appears stable Chest x-ray within normal limits Continue home regimen    Hypothyroidism Continue Synthroid    Fever blister to lower lip vs benign aphthous ulcer  S/p burst dose Valtrex  Viscous lidocaine as needed    Dry eyes Lubricating ointment prn    DVT prophylaxis: heparin, SCD, TED hose Pertinent IV fluids/nutrition: no continuous IV fluids  Central lines / invasive devices: none   Code Status: FULL CODE ACP documentation reviewed: 12/06/22, none on file VYNCA    Current Admission Status: inpatient  TOC needs / Dispo plan: SNF Barriers to discharge / significant pending items:  placement                        Subjective / Brief ROS:  Patient sitting in chair at bedside, no concerns today, feels her eyes are a bit dry but nothing else bothering her   Family Communication: TOC in communication w/ son re: placement     Objective Findings:  Vitals:   12/09/22 0727 12/09/22 1647 12/09/22 2338 12/10/22 0810  BP: (!) 133/59 (!) 153/63 (!)  142/57 (!) 144/52  Pulse: 73 80 70 73  Resp: 18 16 16 18   Temp: 98.2 F (36.8 C) (!) 97.4 F (36.3 C) 98.3 F (36.8 C) 98 F (36.7 C)  TempSrc:      SpO2: 98% 96% 98% 98%  Weight:      Height:        Intake/Output Summary (Last 24 hours) at 12/10/2022 1418 Last data filed at 12/10/2022 8119 Gross per 24 hour  Intake --  Output 1350 ml  Net -1350 ml   Filed Weights   12/05/22 0334 12/05/22 0655  Weight: 102.1 kg 100.7 kg    Examination:  Physical Exam Constitutional:      General: She is not in acute distress.    Appearance: She is obese.  Cardiovascular:     Rate and Rhythm: Normal rate and regular rhythm.     Heart sounds: Normal heart sounds.  Pulmonary:     Effort: Pulmonary effort is normal.     Breath sounds: Normal breath sounds.  Abdominal:     Palpations: Abdomen is soft.     Tenderness: There is no abdominal tenderness.  Neurological:     Cranial Nerves: No dysarthria or facial asymmetry.     Motor: No tremor, abnormal muscle tone or pronator drift.     Coordination: Coordination is intact.          Scheduled Medications:   calcium-vitamin D  1 tablet Oral Q breakfast   docusate sodium  100 mg Oral BID   DULoxetine  60 mg Oral BID   enoxaparin (LOVENOX) injection  0.5 mg/kg Subcutaneous Q24H   Fe Fum-Vit C-Vit B12-FA  1 capsule Oral QPC breakfast   gabapentin  300 mg Oral BID   insulin aspart  0-15 Units Subcutaneous TID WC   insulin aspart  4 Units Subcutaneous TID WC   insulin glargine-yfgn  10 Units Subcutaneous QHS   levothyroxine  75 mcg Oral Q0600   mirtazapine  15 mg Oral QHS   rosuvastatin  40 mg Oral Daily   sulfaSALAzine  1,000 mg Oral BID    Continuous Infusions:    PRN Medications:  acetaminophen, artificial tears, bisacodyl, diphenhydrAMINE, HYDROcodone-acetaminophen, lidocaine, magnesium hydroxide, metoCLOPramide **OR** metoCLOPramide (REGLAN) injection, ondansetron **OR** ondansetron (ZOFRAN) IV, sodium  phosphate  Antimicrobials from admission:  Anti-infectives (From admission, onward)    Start     Dose/Rate Route Frequency Ordered Stop   12/07/22 1100  valACYclovir (VALTREX) tablet 2,000 mg        2,000 mg Oral 2 times daily 12/07/22 1005 12/07/22 2205   12/05/22 2000  ceFAZolin (ANCEF) IVPB 2g/100 mL premix        2 g 200 mL/hr over 30 Minutes Intravenous Every 8 hours 12/05/22 1601 12/06/22 0448   12/05/22 0512  ceFAZolin (ANCEF) IVPB 2g/100 mL premix        2 g 200 mL/hr over 30 Minutes Intravenous 30 min pre-op 12/05/22 0512 12/05/22 1226  Data Reviewed:  I have personally reviewed the following...  CBC: Recent Labs  Lab 12/05/22 0342 12/06/22 0713 12/07/22 1639 12/09/22 0358 12/10/22 0316  WBC 6.3 8.6 8.0 6.2 5.5  NEUTROABS 4.0  --   --   --   --   HGB 12.6 9.2* 8.2* 7.4* 7.4*  HCT 37.1 27.5* 24.4* 22.1* 22.0*  MCV 103.1* 106.2* 107.0* 105.7* 106.3*  PLT 302 280 232 232 271   Basic Metabolic Panel: Recent Labs  Lab 12/05/22 0342 12/06/22 0551 12/07/22 0703 12/08/22 0419 12/09/22 0401  NA 137 134* 138 138 138  K 3.5 4.6 4.1 4.0 3.9  CL 102 104 108 110 109  CO2 20* 20* 21* 19* 25  GLUCOSE 179* 168* 186* 187* 145*  BUN 22 37* 49* 41* 32*  CREATININE 1.27* 2.15* 1.87* 1.40* 1.24*  CALCIUM 8.9 8.3* 7.8* 7.9* 8.1*   GFR: Estimated Creatinine Clearance: 48.4 mL/min (A) (by C-G formula based on SCr of 1.24 mg/dL (H)). Liver Function Tests: Recent Labs  Lab 12/06/22 0551  AST 21  ALT 12  ALKPHOS 52  BILITOT 0.3  PROT 5.0*  ALBUMIN 2.8*   No results for input(s): "LIPASE", "AMYLASE" in the last 168 hours. No results for input(s): "AMMONIA" in the last 168 hours. Coagulation Profile: Recent Labs  Lab 12/05/22 0342  INR 1.0   Cardiac Enzymes: No results for input(s): "CKTOTAL", "CKMB", "CKMBINDEX", "TROPONINI" in the last 168 hours. BNP (last 3 results) No results for input(s): "PROBNP" in the last 8760 hours. HbA1C: No results  for input(s): "HGBA1C" in the last 72 hours.  CBG: Recent Labs  Lab 12/09/22 1147 12/09/22 1705 12/09/22 2116 12/10/22 0812 12/10/22 1219  GLUCAP 161* 164* 109* 136* 268*   Lipid Profile: No results for input(s): "CHOL", "HDL", "LDLCALC", "TRIG", "CHOLHDL", "LDLDIRECT" in the last 72 hours. Thyroid Function Tests: Recent Labs    12/07/22 1639  TSH 0.744   Anemia Panel: Recent Labs    12/07/22 1639 12/09/22 0358  VITAMINB12 638  --   FERRITIN  --  167  TIBC  --  148*  IRON  --  26*   Most Recent Urinalysis On File:     Component Value Date/Time   COLORURINE YELLOW 02/24/2018 1435   APPEARANCEUR CLOUDY (A) 02/24/2018 1435   LABSPEC 1.020 02/24/2018 1435   PHURINE 5.0 02/24/2018 1435   GLUCOSEU 500 (A) 02/24/2018 1435   HGBUR TRACE (A) 02/24/2018 1435   BILIRUBINUR SMALL (A) 02/24/2018 1435   KETONESUR TRACE (A) 02/24/2018 1435   PROTEINUR 100 (A) 02/24/2018 1435   NITRITE NEGATIVE 02/24/2018 1435   LEUKOCYTESUR NEGATIVE 02/24/2018 1435   Sepsis Labs: @LABRCNTIP (procalcitonin:4,lacticidven:4) Microbiology: No results found for this or any previous visit (from the past 240 hour(s)).    Radiology Studies last 3 days: CT HEAD WO CONTRAST ( )  Result Date: 12/06/2022 CLINICAL DATA:  Head trauma EXAM: CT HEAD WITHOUT CONTRAST TECHNIQUE: Contiguous axial images were obtained from the base of the skull through the vertex without intravenous contrast. RADIATION DOSE REDUCTION: This exam was performed according to the departmental dose-optimization program which includes automated exposure control, adjustment of the mA and/or kV according to patient size and/or use of iterative reconstruction technique. COMPARISON:  12/06/2009 FINDINGS: Brain: No evidence of acute infarction, hemorrhage, mass, mass effect, or midline shift. No hydrocephalus or extra-axial fluid collection. Vascular: No hyperdense vessel. Skull: Negative for fracture or focal lesion. Sinuses/Orbits: No  acute finding. Status post bilateral lens replacements. Other: The mastoid air cells are  well aerated. IMPRESSION: No acute intracranial process. Electronically Signed   By: Wiliam Ke M.D.   On: 12/06/2022 20:43             LOS: 5 days       Sunnie Nielsen, DO [facility] 12/10/2022, 2:18 PM    Dictation software may have been used to generate the above note. Typos may occur and escape review in typed/dictated notes. Please contact Dr Lyn Hollingshead directly for clarity if needed.  Staff may message me via secure chat in Epic  but this may not receive an immediate response,  please page me for urgent matters!  If 7PM-7AM, please contact night coverage [email or web address]

## 2022-12-11 DIAGNOSIS — I10 Essential (primary) hypertension: Secondary | ICD-10-CM | POA: Diagnosis not present

## 2022-12-11 DIAGNOSIS — S72002A Fracture of unspecified part of neck of left femur, initial encounter for closed fracture: Secondary | ICD-10-CM | POA: Diagnosis not present

## 2022-12-11 DIAGNOSIS — W19XXXA Unspecified fall, initial encounter: Secondary | ICD-10-CM | POA: Diagnosis not present

## 2022-12-11 DIAGNOSIS — N183 Chronic kidney disease, stage 3 unspecified: Secondary | ICD-10-CM | POA: Diagnosis not present

## 2022-12-11 LAB — CBC
HCT: 22.8 % — ABNORMAL LOW (ref 36.0–46.0)
Hemoglobin: 7.6 g/dL — ABNORMAL LOW (ref 12.0–15.0)
MCH: 35.3 pg — ABNORMAL HIGH (ref 26.0–34.0)
MCHC: 33.3 g/dL (ref 30.0–36.0)
MCV: 106 fL — ABNORMAL HIGH (ref 80.0–100.0)
Platelets: 284 10*3/uL (ref 150–400)
RBC: 2.15 MIL/uL — ABNORMAL LOW (ref 3.87–5.11)
RDW: 13.5 % (ref 11.5–15.5)
WBC: 6.2 10*3/uL (ref 4.0–10.5)
nRBC: 0.3 % — ABNORMAL HIGH (ref 0.0–0.2)

## 2022-12-11 LAB — BASIC METABOLIC PANEL
Anion gap: 6 (ref 5–15)
BUN: 24 mg/dL — ABNORMAL HIGH (ref 8–23)
CO2: 28 mmol/L (ref 22–32)
Calcium: 8.1 mg/dL — ABNORMAL LOW (ref 8.9–10.3)
Chloride: 103 mmol/L (ref 98–111)
Creatinine, Ser: 1.2 mg/dL — ABNORMAL HIGH (ref 0.44–1.00)
GFR, Estimated: 48 mL/min — ABNORMAL LOW (ref >60)
Glucose, Bld: 182 mg/dL — ABNORMAL HIGH (ref 70–99)
Potassium: 3.9 mmol/L (ref 3.5–5.1)
Sodium: 137 mmol/L (ref 135–145)

## 2022-12-11 LAB — GLUCOSE, CAPILLARY
Glucose-Capillary: 169 mg/dL — ABNORMAL HIGH (ref 70–99)
Glucose-Capillary: 208 mg/dL — ABNORMAL HIGH (ref 70–99)

## 2022-12-11 MED ORDER — FE FUM-VIT C-VIT B12-FA 460-60-0.01-1 MG PO CAPS: 1.0000 | ORAL_CAPSULE | Freq: Every day | ORAL | 0 refills | Status: AC

## 2022-12-11 MED ORDER — DOCUSATE SODIUM 100 MG PO CAPS: 100.0000 mg | ORAL_CAPSULE | Freq: Two times a day (BID) | ORAL | 0 refills | Status: AC

## 2022-12-11 MED ORDER — ORAL CARE MOUTH RINSE: 15.0000 mL | OROMUCOSAL | Status: DC | PRN

## 2022-12-11 MED ORDER — ENOXAPARIN SODIUM 60 MG/0.6ML IJ SOSY: 0.5000 mg/kg | PREFILLED_SYRINGE | INTRAMUSCULAR | 0 refills | Status: AC

## 2022-12-11 NOTE — Progress Notes (Signed)
Patient is not able to walk the distance required to go the bathroom, or he/she is unable to safely negotiate stairs required to access the bathroom.  A 3in1 BSC will alleviate this problem  

## 2022-12-11 NOTE — Care Management Important Message (Signed)
Important Message  Patient Details  Name: Kaitlin Kelley MRN: [id number] Date of Birth: [birth year]   Medicare Important Message Given:  Yes     Olegario Messier A Jarrick Fjeld 12/11/2022, 2:20 PM

## 2022-12-11 NOTE — Discharge Summary (Signed)
Physician Discharge Summary   Patient: Kaitlin Kelley MRN: [id number]  DOB: 1950/12/27   Admit:     Date of Admission: 12/05/2022 Admitted from: home   Discharge: Date of discharge: 12/11/22 Disposition: Home w/ home health  Condition at discharge: good  CODE STATUS: FULL CODE      Discharge Physician: Sunnie Nielsen, DO [facility]     PCP: Elissa Hefty, MD  Recommendations for Outpatient Follow-up:  Follow up with PCP Elissa Hefty, MD in 1-2 weeks Follow up w/ orthopedics in 7-10 days from discharge for staple removal Please obtain labs/tests: CBC, BMP in 1-2 weeks Please follow up on the following pending results: none PCP AND OTHER OUTPATIENT PROVIDERS: SEE BELOW FOR SPECIFIC DISCHARGE INSTRUCTIONS PRINTED FOR PATIENT IN ADDITION TO GENERIC AVS PATIENT INFO    Discharge Instructions     Call MD for:  redness, tenderness, or signs of infection (pain, swelling, redness, odor or green/yellow discharge around incision site)   Complete by: As directed    Diet - low sodium heart healthy   Complete by: As directed    Diet Carb Modified   Complete by: As directed    Increase activity slowly   Complete by: As directed          Discharge Diagnoses: Principal Problem:   Hip fracture (HCC) Active Problems:   Hypertension   Type 2 diabetes mellitus (HCC)   Sarcoidosis   Hypothyroidism   Fall at home, initial encounter   CKD (chronic kidney disease) stage 3, GFR 30-59 ml/min [facility])       Hospital Course: Kaitlin Kelley is a 72 y.o. female with medical history significant of hypertension, hypothyroidism, type 2 diabetes, sarcoidosis, Rheumatoid arthritis presenting from home to ED 12/05/22 with fall and left hip fracture. Patient reports feeling her sugar was low overnight, attempted to go to the fridge to get something to eat.  Developed generalized weakness with subsequent fall.   05/28: Left hip plain films with acute intertrochanteric  left femur fracture with varus deformity. TO OR for reduction and internal fixation of displaced intertrochanteric left hip fracture with [employer] TFN nail w/ Dr Joice Lofts. (Note f/u with [facility] orthopaedics in 10-14 days for staple removal 06/07-06/11) 05/29: worsening renal function. Suspect hypovolemia/hypotension related, FeNa = prerenal. IV fluids through today, holding BP meds and other nephrotoxics, follow BMP. Suspect hospital delirium, medication effect but no CT head in ED w/ fall, CT done this evening, no concerns  05/30: AKI improving, not back to baseline, conitnue IV fluids. TOC working on SNF placement.  05/31 (Friday):Cr at baseline. Placement pending.  06/01-06/02: stable thru weekend 06/03: family and pt opting now for home health.    Consultants:  orthopedics   Procedures: 12/05/2022 L intramedullary intertrochanteric nail fixation of displaced intertrochanteric left hip fracture                 ASSESSMENT & PLAN:   Hip fracture (HCC) Fall at home, initial encounter intertrochanteric left femur fracture with varus deformity status post mechanical fall POD6 s/p nail repair  Up with therapy, WBAT to [facility] PT/OT recs for SNF rehab, family now wanting HH, TOC following    AKI (acute kidney injury) (HCC) - improving  CKD (chronic kidney disease) stage 3a, GFR 30-59 ml/min (HCC) Creatinine 1.3 initially with GFR 45, worsening yesterday to Cr 2.15 and GFR 24 --> Cr 1.87 and GFR 28 FeNa was prerenal and Cr improved w/ fluids  Holding ARB as BP  have been soft anyway  Reduced gabapentin Holding NSAID Follow BMP outpatient    Confusion - improved Per son, new on HD2  Suspect hospital delirium, medication effect but no CT head in ED w/ fall, obtained CT 05/29 no concerns  No concerns on neuro exam  Monitor    Anemia Post-op frop in Hgb but baseline anemia w/ macrocytosis  Normal B12 and folate  Monitor CBC Follow outpatient  Iron supplementation  Transfuse if <7    Hypertension BP stable or low here  Suspect orthostatic hypotension may have contributed to fall  Titrate home regimen Held nephrotoxins in setting of AKI   Type 2 diabetes mellitus (HCC) SSI ac hs + basal    Sarcoidosis Appears stable Chest x-ray within normal limits Continue home regimen    Hypothyroidism Continue Synthroid    Fever blister to lower lip vs benign aphthous ulcer  S/p burst dose Valtrex  Viscous lidocaine as needed    Dry eyes Lubricating ointment prn                   Discharge Instructions  Allergies as of 12/11/2022   No Known Allergies      Medication List     STOP taking these medications    amLODipine 5 MG tablet Commonly known as: NORVASC   losartan 50 MG tablet Commonly known as: COZAAR       TAKE these medications    AIRBORNE PO Take 2 tablets by mouth in the morning and at bedtime.   ALIGN PREBIOTIC-PROBIOTIC PO Take 1 capsule by mouth daily.   azelastine 0.1 % nasal spray Commonly known as: ASTELIN Place 1 spray into both nostrils as needed for allergies.   calcium-vitamin D 500-5 MG-MCG tablet Commonly known as: OSCAL WITH D Take 1 tablet by mouth. 1000mg    Cholecalciferol 50 MCG (2000 UT) Caps Take 2,000 Units by mouth daily.   Collagen 1500/C 500-50-0.8 MG Caps Generic drug: Collagen-Vitamin C-Biotin daily.   Cyanocobalamin 3000 MCG Caps Take 3,000 mcg by mouth daily. What changed: Another medication with the same name was removed. Continue taking this medication, and follow the directions you see here.   dicyclomine 10 MG capsule Commonly known as: BENTYL Take 10 mg by mouth 4 (four) times daily as needed for spasms.   docusate sodium 100 MG capsule Commonly known as: COLACE Take 1 capsule (100 mg total) by mouth 2 (two) times daily.   dorzolamide 2 % ophthalmic solution Commonly known as: TRUSOPT Place 1 drop into both eyes in the morning and at bedtime.   DULoxetine 60 MG capsule Commonly  known as: CYMBALTA Take 60 mg by mouth 2 (two) times daily.   enoxaparin 60 MG/0.6ML injection Commonly known as: LOVENOX Inject 0.5 mLs (50 mg total) into the skin daily.   Fe Fum-Vit C-Vit B12-FA Caps capsule Commonly known as: TRIGELS-F FORTE Take 1 capsule by mouth daily after breakfast. Start taking on: December 12, 2022   ferrous gluconate 240 (27 FE) MG tablet Commonly known as: FERGON Take by mouth.   folic acid 1 MG tablet Commonly known as: FOLVITE Take 1 mg by mouth daily.   gabapentin 300 MG capsule Commonly known as: NEURONTIN Take 300 mg by mouth 3 (three) times daily.   glipiZIDE 10 MG tablet Commonly known as: GLUCOTROL Take 10 mg by mouth in the morning and at bedtime.   HYDROcodone-acetaminophen 5-325 MG tablet Commonly known as: NORCO/VICODIN Take 1-2 tablets by mouth every 4 (four) hours as  needed for moderate pain (pain score 4-6).   insulin detemir 100 UNIT/ML injection Commonly known as: LEVEMIR Inject 30 Units into the skin at bedtime.   Lactobacillus 0.05-0.05 MG Tabs Take by mouth.   Lancets 28G Misc use to check blood sugar three to four times a day or as directed   levothyroxine 75 MCG tablet Commonly known as: SYNTHROID Take 75 mcg by mouth See admin instructions. Take 75 mcg by mouth daily before breakfast Monday through Saturday   levothyroxine 75 MCG tablet Commonly known as: SYNTHROID Take 1 tablet by mouth daily.   methotrexate 2.5 MG tablet Commonly known as: RHEUMATREX TAKE 3 TABLETS (7.5 MG TOTAL) BY MOUTH EVERY 7 (SEVEN) DAYS   mirtazapine 15 MG tablet Commonly known as: REMERON Take 15 mg by mouth at bedtime.   modafinil 100 MG tablet Commonly known as: PROVIGIL Take by mouth.   Multiple Vitamin tablet Take 1 tablet by mouth daily.   rosuvastatin 40 MG tablet Commonly known as: CRESTOR Take 1 tablet by mouth daily.   Soliqua 100-33 UNT-MCG/ML Sopn Generic drug: Insulin Glargine-Lixisenatide Inject 30 Units into  the skin at bedtime.   sulfaSALAzine 500 MG tablet Commonly known as: AZULFIDINE Take 1,000 mg by mouth 2 (two) times daily.   TURMERIC PO Take 1 capsule by mouth daily.               Durable Medical Equipment  (From admission, onward)           Start     Ordered   12/11/22 1245  For home use only DME Shower stool  Once        12/11/22 1244   12/11/22 1244  For home use only DME Bedside commode  Once       Question:  Patient needs a bedside commode to treat with the following condition  Answer:  Closed left hip fracture (HCC)   12/11/22 1244   12/08/22 0900  For home use only DME Walker rolling  Once       Question Answer Comment  Walker: With 5 Inch Wheels   Patient needs a walker to treat with the following condition Generalized weakness      12/08/22 0859   12/08/22 0900  For home use only DME Bedside commode  Once       Question:  Patient needs a bedside commode to treat with the following condition  Answer:  General weakness   12/08/22 0859             Follow-up Information     Poggi, Excell Seltzer, MD. Go in 1 week(s).   Specialty: Orthopedic Surgery Why: follow up on hip fracture;  Office will call patient w/ Appt Contact information: [address] [facility] [address] [facility] [place] [address] [phone]         Elissa Hefty, MD. Go on 12/19/2022.   Specialty: Internal Medicine Why: follow up on medications, blood pressure;  Appt @ 3:15 pm Contact information: [address] [facility] [address] [facility] [place] [address] [phone]                 No Known Allergies   Subjective: pt feeling well, no concerns today, no reservations about going home w/ HH vs to SNF as recommended   Discharge Exam: BP (!) 135/57 (BP Location: Right Arm)   Pulse 71   Temp 98 F (36.7 C)   Resp 16   Ht 5\' 6"  (1.676 m)   Wt 100.7 kg  SpO2 94%   BMI 35.83 kg/m  General: Pt is alert, awake, not in acute distress Cardiovascular: RRR, S1/S2 +,  no rubs, no gallops Respiratory: CTA bilaterally, no wheezing, no rhonchi Abdominal: Soft, NT, ND, bowel sounds + Extremities: no edema, no cyanosis     The results of significant diagnostics from this hospitalization (including imaging, microbiology, ancillary and laboratory) are listed below for reference.     Microbiology: No results found for this or any previous visit (from the past 240 hour(s)).   Labs: BNP (last 3 results) No results for input(s): "BNP" in the last 8760 hours. Basic Metabolic Panel: Recent Labs  Lab 12/06/22 0551 12/07/22 0703 12/08/22 0419 12/09/22 0401 12/11/22 0421  NA 134* 138 138 138 137  K 4.6 4.1 4.0 3.9 3.9  CL 104 108 110 109 103  CO2 20* 21* 19* 25 28  GLUCOSE 168* 186* 187* 145* 182*  BUN 37* 49* 41* 32* 24*  CREATININE 2.15* 1.87* 1.40* 1.24* 1.20*  CALCIUM 8.3* 7.8* 7.9* 8.1* 8.1*   Liver Function Tests: Recent Labs  Lab 12/06/22 0551  AST 21  ALT 12  ALKPHOS 52  BILITOT 0.3  PROT 5.0*  ALBUMIN 2.8*   No results for input(s): "LIPASE", "AMYLASE" in the last 168 hours. No results for input(s): "AMMONIA" in the last 168 hours. CBC: Recent Labs  Lab 12/05/22 0342 12/06/22 0713 12/07/22 1639 12/09/22 0358 12/10/22 0316 12/11/22 0421  WBC 6.3 8.6 8.0 6.2 5.5 6.2  NEUTROABS 4.0  --   --   --   --   --   HGB 12.6 9.2* 8.2* 7.4* 7.4* 7.6*  HCT 37.1 27.5* 24.4* 22.1* 22.0* 22.8*  MCV 103.1* 106.2* 107.0* 105.7* 106.3* 106.0*  PLT 302 280 232 232 271 284   Cardiac Enzymes: No results for input(s): "CKTOTAL", "CKMB", "CKMBINDEX", "TROPONINI" in the last 168 hours. BNP: Invalid input(s): "POCBNP" CBG: Recent Labs  Lab 12/10/22 1219 12/10/22 1729 12/10/22 2017 12/11/22 0651 12/11/22 1213  GLUCAP 268* 150* 208* 169* 208*   D-Dimer No results for input(s): "DDIMER" in the last 72 hours. Hgb A1c No results for input(s): "HGBA1C" in the last 72 hours. Lipid Profile No results for input(s): "CHOL", "HDL", "LDLCALC",  "TRIG", "CHOLHDL", "LDLDIRECT" in the last 72 hours. Thyroid function studies No results for input(s): "TSH", "T4TOTAL", "T3FREE", "THYROIDAB" in the last 72 hours.  Invalid input(s): "FREET3" Anemia work up Recent Labs    12/09/22 0358  FERRITIN 167  TIBC 148*  IRON 26*   Urinalysis    Component Value Date/Time   COLORURINE YELLOW 02/24/2018 1435   APPEARANCEUR CLOUDY (A) 02/24/2018 1435   LABSPEC 1.020 02/24/2018 1435   PHURINE 5.0 02/24/2018 1435   GLUCOSEU 500 (A) 02/24/2018 1435   HGBUR TRACE (A) 02/24/2018 1435   BILIRUBINUR SMALL (A) 02/24/2018 1435   KETONESUR TRACE (A) 02/24/2018 1435   PROTEINUR 100 (A) 02/24/2018 1435   NITRITE NEGATIVE 02/24/2018 1435   LEUKOCYTESUR NEGATIVE 02/24/2018 1435   Sepsis Labs Recent Labs  Lab 12/07/22 1639 12/09/22 0358 12/10/22 0316 12/11/22 0421  WBC 8.0 6.2 5.5 6.2   Microbiology No results found for this or any previous visit (from the past 240 hour(s)). Imaging CT HEAD WO CONTRAST ( )  Result Date: 12/06/2022 CLINICAL DATA:  Head trauma EXAM: CT HEAD WITHOUT CONTRAST TECHNIQUE: Contiguous axial images were obtained from the base of the skull through the vertex without intravenous contrast. RADIATION DOSE REDUCTION: This exam was performed according to the departmental dose-optimization program  which includes automated exposure control, adjustment of the mA and/or kV according to patient size and/or use of iterative reconstruction technique. COMPARISON:  12/06/2009 FINDINGS: Brain: No evidence of acute infarction, hemorrhage, mass, mass effect, or midline shift. No hydrocephalus or extra-axial fluid collection. Vascular: No hyperdense vessel. Skull: Negative for fracture or focal lesion. Sinuses/Orbits: No acute finding. Status post bilateral lens replacements. Other: The mastoid air cells are well aerated. IMPRESSION: No acute intracranial process. Electronically Signed   By: Wiliam Ke M.D.   On: 12/06/2022 20:43   DG HIP  UNILAT WITH PELVIS 2-3 VIEWS LEFT  Result Date: 12/05/2022 CLINICAL DATA:  Elective surgery. EXAM: DG HIP (WITH OR WITHOUT PELVIS) 2-3V LEFT COMPARISON:  Radiograph earlier today FINDINGS: Nine fluoroscopic spot views of the pelvis and left hip obtained in the operating room. Sequential images intramedullary nail with trans trochanteric and distal locking screw fixation of proximal femur fracture. Fluoroscopy time 1 minute 44 seconds. Dose 45.6 mGy. IMPRESSION: Intraoperative fluoroscopy during proximal femur fracture ORIF. Electronically Signed   By: Narda Rutherford M.D.   On: 12/05/2022 14:27   DG C-Arm 1-60 Min-No Report  Result Date: 12/05/2022 Fluoroscopy was utilized by the requesting physician.  No radiographic interpretation.   DG C-Arm 1-60 Min-No Report  Result Date: 12/05/2022 Fluoroscopy was utilized by the requesting physician.  No radiographic interpretation.   DG Chest Portable 1 View  Result Date: 12/05/2022 CLINICAL DATA:  Fall injury. EXAM: PORTABLE CHEST 1 VIEW COMPARISON:  AP Lat 11/08/2022 FINDINGS: There is mild cardiomegaly. There is aortic tortuosity and mild ectasia with patchy calcifications and stable mediastinum. No vascular congestion is seen. The lungs are clear with asymmetric chronic elevation of the right diaphragm. No pleural effusion is evident. There is osteopenia and bridging enthesopathy of the thoracic spine. Multiple overlying telemetry leads. IMPRESSION: No acute radiographic chest findings.  Chronic findings as above. Electronically Signed   By: Almira Bar M.D.   On: 12/05/2022 04:50   DG HIP UNILAT WITH PELVIS 2-3 VIEWS LEFT  Result Date: 12/05/2022 CLINICAL DATA:  Hip fracture EXAM: DG HIP (WITH OR WITHOUT PELVIS) 2-3V LEFT COMPARISON:  None Available. FINDINGS: Acute intertrochanteric left femur fracture with varus angulation. No evidence of pelvic ring fracture or diastasis. Subjective osteopenia. IMPRESSION: Acute intertrochanteric left femur  fracture with varus deformity. Electronically Signed   By: Tiburcio Pea M.D.   On: 12/05/2022 04:44      Time coordinating discharge: over 30 minutes  SIGNED:  Sunnie Nielsen DO [facility]

## 2022-12-11 NOTE — Progress Notes (Signed)
  Subjective: 6 Days Post-Op Procedure(s) (LRB): INTRAMEDULLARY (IM) NAIL INTERTROCHANTERIC (Left) Patient reports pain as mild in the left leg this morning but does still report moderate pain when attempting to ambulate. Patient is well, and has had no acute complaints or problems. Plan is for SNF placement following discharge Negative for chest pain and shortness of breath Fever: no Gastrointestinal:Negative for nausea and vomiting Reports she is urinating well and has had a BM.  Objective: Vital signs in last 24 hours: Temp:  [97.8 F (36.6 C)-98 F (36.7 C)] 98 F (36.7 C) (06/03 0820) Pulse Rate:  [69-71] 71 (06/03 0820) Resp:  [16-18] 16 (06/03 0820) BP: (134-156)/(55-59) 135/57 (06/03 0820) SpO2:  [94 %-99 %] 94 % (06/03 0820)  Intake/Output from previous day:  Intake/Output Summary (Last 24 hours) at 12/11/2022 1247 Last data filed at 12/11/2022 1038 Gross per 24 hour  Intake 360 ml  Output 1350 ml  Net -990 ml    Intake/Output this shift: Total I/O In: 360 [P.O.:360] Out: -   Labs: Recent Labs    12/09/22 0358 12/10/22 0316 12/11/22 0421  HGB 7.4* 7.4* 7.6*   Recent Labs    12/10/22 0316 12/11/22 0421  WBC 5.5 6.2  RBC 2.07* 2.15*  HCT 22.0* 22.8*  PLT 271 284   Recent Labs    12/09/22 0401 12/11/22 0421  NA 138 137  K 3.9 3.9  CL 109 103  CO2 25 28  BUN 32* 24*  CREATININE 1.24* 1.20*  GLUCOSE 145* 182*  CALCIUM 8.1* 8.1*   No results for input(s): "LABPT", "INR" in the last 72 hours.    EXAM General - Patient is alert and oriented. Extremity - Neurovascular intact Dorsiflexion/Plantar flexion intact Incision: moderate drainage No cellulitis present Compartment soft - homans sign Dressing/Incision - Moderate serosanguinous drainage noted proximally, new honeycomb dressing applied today. Motor Function - intact, moving foot and toes well on exam.   Past Medical History:  Diagnosis Date   Arthritis    Chronic cough    Depression     Diabetes mellitus without complication (HCC)    Edema    LEGS/FEET   GERD (gastroesophageal reflux disease)    NO MEDS   Headache    H/O MIGRAINES   HOH (hard of hearing)    Hypertension    Hypothyroidism    IBS (irritable bowel syndrome)    Sleep apnea    CPAP   Assessment/Plan: 6 Days Post-Op Procedure(s) (LRB): INTRAMEDULLARY (IM) NAIL INTERTROCHANTERIC (Left) Principal Problem:   Hip fracture (HCC) Active Problems:   Hypertension   Type 2 diabetes mellitus (HCC)   Sarcoidosis   Hypothyroidism   Fall at home, initial encounter   CKD (chronic kidney disease) stage 3, GFR 30-59 ml/min (HCC)  Estimated body mass index is 35.83 kg/m as calculated from the following:   Height as of this encounter: 5\' 6"  (1.676 m).   Weight as of this encounter: 100.7 kg. Advance diet Up with therapy   Acute post op blood loss anemia - Hgb 7.6, continue Fe.  VSS Pain well controlled CM to assist with discharge to SNF monday  Following discharge, follow-up with [facility] orthopaedics in 10-14 days for staple removal. Discharge to SNF with Lovenox 50 mg SQ daily x 14 days  DVT Prophylaxis - Lovenox TEDs SCDs Weight-Bearing as tolerated to left leg  J. Horris Latino, PA-C [facility] Orthopaedic Surgery 12/11/2022, 12:47 PM

## 2022-12-11 NOTE — TOC Transition Note (Signed)
Transition of Care [facility]) - CM/SW Discharge Note   Patient Details  Name: Kaitlin Kelley MRN: [id number] Date of Birth: April 08, 1951  [facility] CM/SW Contact:  Garret Reddish, RN Phone Number: 12/11/2022, 2:39 PM   Clinical Narrative:  Chart reviewed.  I have spoken with patient and her son [name].  Patient would like to go home with Home Health services.  I have also given patient SNF bed offers and patient and her son would like to go home with home health services.  I have spoken with patient and her son about home healht services.  Patient and her son did not have any home health preference.  I have asked Barbara Cower with [facility] to accept home health referral.    Patient and her son [name] reports that patient does not have a 2 wheeled rolling walker, and BSC.  Patient and her son willing to have BSC and 2 wheeled rolling walker ordered for home use.  Patient has also request a shower chair.  I have informed patient and her son that the shower chair would be a private paid item.  Patient and son agreeable to paying for shower chair.    I have asked [employer] to provide a shower chair, BSC, and 2 wheeled rolling walker at patient's bedside today.  Patient's son will transport patient home today.    I have informed staff nurse of the above information.      Final next level of care: Home w Home Health Services Barriers to Discharge: No Barriers Identified   Patient Goals and CMS Choice CMS Medicare.gov Compare Post Acute Care list provided to:: Patient Choice offered to / list presented to : Patient, Adult Children  Discharge Placement                    Name of family member notified: [name] Patient and family notified of of transfer: 12/11/22  Discharge Plan and Services Additional resources added to the After Visit Summary for     Discharge Planning Services: CM Consult            DME Arranged: 3-N-1, Walker rolling, Shower stool DME Agency: [employer] Date DME  Agency Contacted: 12/11/22 Time DME Agency Contacted: 1200 Representative spoke with at DME Agency: Barbara Cower HH Arranged: PT, OT, Nurse's Aide HH Agency: [facility] Date HH Agency Contacted: 12/11/22 Time HH Agency Contacted: 1230 Representative spoke with at [facility] Agency: Barbara Cower  Social Determinants of Health (SDOH) Interventions SDOH Screenings   Food Insecurity: No Food Insecurity (12/06/2022)  Housing: Low Risk  (12/06/2022)  Transportation Needs: No Transportation Needs (12/06/2022)  Utilities: Not At Risk (12/06/2022)  Depression (PHQ2-9): High Risk (10/20/2022)  Tobacco Use: Low Risk  (12/06/2022)     Readmission Risk Interventions     No data to display

## 2022-12-11 NOTE — Inpatient Diabetes Management (Signed)
Inpatient Diabetes Program Recommendations  AACE/ADA: New Consensus Statement on Inpatient Glycemic Control   Target Ranges:  Prepandial:   less than 140 mg/dL      Peak postprandial:   less than 180 mg/dL (1-2 hours)      Critically ill patients:  140 - 180 mg/dL    Latest Reference Range & Units 12/10/22 08:12 12/10/22 12:19 12/10/22 17:29 12/10/22 20:17 12/11/22 06:51  Glucose-Capillary 70 - 99 mg/dL 161 (H) 096 (H) 045 (H) 208 (H) 169 (H)   Review of Glycemic Control  Diabetes history: DM2 Outpatient Diabetes medications: Soliqua 100-33 unit-mcg/ml 24 units QHS, Humalog 3-11 units with meals (takes 4 units with meals plus correction) Current orders for Inpatient glycemic control: Semglee 10 units QHS, Novolog 0-15 units TID with meals, Novolog 4 units TID with meals   Inpatient Diabetes Program Recommendations:     Insulin: Please consider increasing meal coverage to Novolog 6 units TID with meals if patient eats at least 50% of meals.   HbgA1C: A1C 9.4% on 12/05/22 indicating an average glucose of 223 mg/dl over the past 2-3 months. Prior A1C 8.6% on 09/06/22 when last seen Endocrinology.   Outpatient: At time of discharge, please consider prescribing Baqsimi (#[id number]) for treatment of severe hypoglycemia.    Thanks, Orlando Penner, RN, MSN, CDCES Diabetes Coordinator Inpatient Diabetes Program [phone] (Team Pager from 8am to 5pm)

## 2023-08-24 ENCOUNTER — Encounter: Payer: Self-pay | Admitting: Cardiology

## 2023-08-24 ENCOUNTER — Ambulatory Visit: Payer: PPO | Attending: Cardiology | Admitting: Cardiology

## 2023-08-24 VITALS — BP 142/76 | HR 81 | Ht 66.0 in | Wt 202.2 lb

## 2023-08-24 DIAGNOSIS — I951 Orthostatic hypotension: Secondary | ICD-10-CM

## 2023-08-24 DIAGNOSIS — E7800 Pure hypercholesterolemia, unspecified: Secondary | ICD-10-CM | POA: Diagnosis not present

## 2023-08-24 MED ORDER — MIDODRINE HCL 2.5 MG PO TABS: 2.5000 mg | ORAL_TABLET | Freq: Two times a day (BID) | ORAL | 3 refills | Status: DC

## 2023-08-24 NOTE — Patient Instructions (Signed)
Medication Instructions:  Your physician recommends the following medication changes.  START TAKING: Midodrine 2.5 mg in the morning and evening  *If you need a refill on your cardiac medications before your next appointment, please call your pharmacy*   Lab Work: None ordered at this time  If you have labs (blood work) drawn today and your tests are completely normal, you will receive your results only by: MyChart Message (if you have MyChart) OR A paper copy in the mail If you have any lab test that is abnormal or we need to change your treatment, we will call you to review the results.   Follow-Up: At [facility], you and your health needs are our priority.  As part of our continuing mission to provide you with exceptional heart care, we have created designated Provider Care Teams.  These Care Teams include your primary Cardiologist (physician) and Advanced Practice Providers (APPs -  Physician Assistants and Nurse Practitioners) who all work together to provide you with the care you need, when you need it.  We recommend signing up for the patient portal called "MyChart".  Sign up information is provided on this After Visit Summary.  MyChart is used to connect with patients for Virtual Visits (Telemedicine).  Patients are able to view lab/test results, encounter notes, upcoming appointments, etc.  Non-urgent messages can be sent to your provider as well.   To learn more about what you can do with MyChart, go to [email or web address].    Your next appointment:   6-8 week(s)  Provider:   You may see Dr Debbe Odea or one of the following Advanced Practice Providers on your designated Care Team:   Nicolasa Ducking, NP Eula Listen, PA-C Cadence Fransico Michael, PA-C Charlsie Quest, NP Carlos Levering, NP

## 2023-08-24 NOTE — Progress Notes (Signed)
Cardiology Office Note:    Date:  08/24/2023   ID:  Clovis Warwick, DOB 09-Dec-1950, MRN [id number]  PCP:  Elissa Hefty, MD   [facility] Providers Cardiologist:  None     Referring MD: Elissa Hefty, MD   Chief Complaint  Patient presents with   New Patient (Initial Visit)    Referred for cardiac evaluation of orthostatic hypotension.  Seen at [facility] cardiology in 2024 and Dr. Mariah Milling in 2021.  Patient reports the hypotension episodes have improved since referral placed.  But did have a small dizzy spell today with continued off balanced feeling now.  Also says having a constant left side chest pressure that is tender to the touch.  Orthostat bp obtained today, bp dropped upon standing and patient dizzy.   Kaitlin Kelley is a 73 y.o. female who is being seen today for the evaluation of orthostatic hypotension at the request of Elissa Hefty, MD.   History of Present Illness:    Kaitlin Kelley is a 73 y.o. female with a hx of diabetes, hypertension, hyperlipidemia  presenting due to orthostatic hypotension.  Saw pcp 06/27/2023 with symptoms of dizziness, orthostatic vitals showed 30 point drop in systolic blood pressure, 20 point increase in heart rate.  He states following sometime last year after getting dizzy fracturing left hip.  Attributes fall to low blood sugars.  Was prescribed valsartan by PCP due to hypotension, she took this once, has not taking any valsartan over the past several months.  Had cardiac workup at [facility] due to episode of syncope.  Echo 10/24 EF 55 to 60%. Lexiscan Myoview 10/24 showed no ischemia.  Past Medical History:  Diagnosis Date   Arthritis    Chronic cough    Depression    Diabetes mellitus without complication (HCC)    Edema    LEGS/FEET   GERD (gastroesophageal reflux disease)    NO MEDS   Headache    H/O MIGRAINES   HOH (hard of hearing)    Hypertension    Hypothyroidism    IBS (irritable bowel syndrome)     Sleep apnea    CPAP    Past Surgical History:  Procedure Laterality Date   EYE SURGERY     GAS INSERTION Left 03/17/2015   Procedure: INSERTION OF GAS;  Surgeon: Marcelene Butte, MD;  Location: [facility] ORS;  Service: Ophthalmology;  Laterality: Left;  [id number]   GASTRIC BYPASS     HAND SURGERY     THUMB AND PINKY FINGER    INTRAMEDULLARY (IM) NAIL INTERTROCHANTERIC Left 12/05/2022   Procedure: INTRAMEDULLARY (IM) NAIL INTERTROCHANTERIC;  Surgeon: Christena Flake, MD;  Location: [facility] ORS;  Service: Orthopedics;  Laterality: Left;   PARS PLANA VITRECTOMY Left 03/17/2015   Procedure: PARS PLANA VITRECTOMY WITH 25 GAUGE;  Surgeon: Marcelene Butte, MD;  Location: [facility] ORS;  Service: Ophthalmology;  Laterality: Left;   SHOULDER ARTHROSCOPY     TONSILLECTOMY     TUBAL LIGATION     VIDEO BRONCHOSCOPY WITH ENDOBRONCHIAL NAVIGATION N/A 03/05/2020   Procedure: VIDEO BRONCHOSCOPY WITH ENDOBRONCHIAL NAVIGATION;  Surgeon: Vida Rigger, MD;  Location: [facility] ORS;  Service: Thoracic;  Laterality: N/A;    Current Medications: Current Meds  Medication Sig   azaTHIOprine (IMURAN) 50 MG tablet Take 100 mg by mouth in the morning and at bedtime.   Bacillus Coagulans-Inulin (ALIGN PREBIOTIC-PROBIOTIC PO) Take 1 capsule by mouth daily.   calcium-vitamin D (OSCAL WITH D) 500-5 MG-MCG tablet Take 1 tablet by mouth.  1000mg    Cholecalciferol 50 MCG (2000 UT) CAPS Take 2,000 Units by mouth daily.    Collagen-Vitamin C-Biotin (COLLAGEN 1500/C) 500-50-0.8 MG CAPS daily.   Cyanocobalamin 3000 MCG CAPS Take 3,000 mcg by mouth daily.    dicyclomine (BENTYL) 10 MG capsule Take 10 mg by mouth 4 (four) times daily as needed for spasms.    dorzolamide (TRUSOPT) 2 % ophthalmic solution Place 1 drop into both eyes in the morning and at bedtime.    DULoxetine (CYMBALTA) 60 MG capsule Take 60 mg by mouth 2 (two) times daily.    ferrous gluconate (FERGON) 240 (27 FE) MG tablet Take by mouth.   folic acid (FOLVITE) 1 MG tablet  Take 1 mg by mouth daily.   gabapentin (NEURONTIN) 300 MG capsule Take 300 mg by mouth 3 (three) times daily.   Insulin Glargine-Lixisenatide (SOLIQUA) 100-33 UNT-MCG/ML SOPN Inject 30 Units into the skin at bedtime.   Insulin Glargine-Lixisenatide (SOLIQUA) 100-33 UNT-MCG/ML SOPN Inject into the skin.   insulin lispro (HUMALOG) 100 UNIT/ML KwikPen INJECT 6 UNITS WITH SNACK AND 11 UNITS EACH MEAL PLUS CORR OF 1 UNIT PER 35 POINTS WHEN BS >150 (UP TO 50 UNITS DAILY WHEN INSTRUCTED)   levothyroxine (SYNTHROID) 75 MCG tablet Take 1 tablet by mouth daily.   lidocaine 4 % Place onto the skin.   methotrexate (RHEUMATREX) 2.5 MG tablet TAKE 3 TABLETS (7.5 MG TOTAL) BY MOUTH EVERY 7 (SEVEN) DAYS   midodrine (PROAMATINE) 2.5 MG tablet Take 1 tablet (2.5 mg total) by mouth 2 (two) times daily with a meal.   rosuvastatin (CRESTOR) 40 MG tablet Take 1 tablet by mouth daily.     Allergies:   Patient has no known allergies.   Social History   Socioeconomic History   Marital status: Widowed    Spouse name: Not on file   Number of children: Not on file   Years of education: Not on file   Highest education level: Not on file  Occupational History   Not on file  Tobacco Use   Smoking status: Never   Smokeless tobacco: Never  Vaping Use   Vaping status: Never Used  Substance and Sexual Activity   Alcohol use: Not Currently    Comment: RARE   Drug use: No   Sexual activity: Not on file  Other Topics Concern   Not on file  Social History Narrative   Lives in  with husband; 2 sons; never smoked; rare alcohol; customer service; retd. Used to live in [place].    Social Drivers of Health   Financial Resource Strain: Medium Risk (08/14/2023)   Received [facility]   Overall Financial Resource Strain (CARDIA)    Difficulty of Paying Living Expenses: Somewhat hard  Food Insecurity: No Food Insecurity (08/14/2023)   Received from [facility]   Hunger Vital Sign    Worried  About Running Out of Food in the Last Year: Never true    Ran Out of Food in the Last Year: Never true  Transportation Needs: No Transportation Needs (08/14/2023)   Received from [facility] - Transportation    Lack of Transportation (Medical): No    Lack of Transportation (Non-Medical): No  Physical Activity: Unknown (08/14/2023)   Received from [facility]   Exercise Vital Sign    Days of Exercise per Week: 0 days    Minutes of Exercise per Session: Not on file  Stress: No Stress Concern Present (08/14/2023)   Received from [facility]  [employer] [facility] Occupational [facility] - Occupational Stress Questionnaire    Feeling of Stress : Not at all  Social Connections: Somewhat Isolated (08/14/2023)   Received from [facility]   Social Network    How would you rate your social network (family, work, friends)?: Restricted participation with some degree of social isolation     Family History: The patient's family history includes Brain cancer in her cousin; Breast cancer in her paternal aunt; Diabetes in her brother and father; Heart disease in her mother; Leukemia in her brother; Macular degeneration in her niece; Prostate cancer in her brother.  ROS:   Please see the history of present illness.     All other systems reviewed and are negative.  EKGs/Labs/Other Studies Reviewed:    The following studies were reviewed today:  EKG Interpretation Date/Time:  Friday August 24 2023 11:02:56 EST Ventricular Rate:  81 PR Interval:  154 QRS Duration:  86 QT Interval:  396 QTC Calculation: 460 R Axis:   -14  Text Interpretation: Normal sinus rhythm Nonspecific T wave abnormality Confirmed by Debbe Odea ([id number]) on 08/24/2023 11:03:33 AM    Recent Labs: 12/06/2022: ALT 12 12/07/2022: TSH 0.744 12/11/2022: BUN 24; Creatinine, Ser 1.20; Hemoglobin 7.6; Platelets 284; Potassium 3.9; Sodium 137  Recent Lipid Panel No results found for: "CHOL", "TRIG", "HDL", "CHOLHDL",  "VLDL", "LDLCALC", "LDLDIRECT"   Risk Assessment/Calculations:         Physical Exam:    VS:  BP (!) 142/76 (BP Location: Left Arm, Patient Position: Sitting, Cuff Size: Large)   Pulse 81   Ht 5\' 6"  (1.676 m)   Wt 202 lb 3.2 oz (91.7 kg)   SpO2 96%   BMI 32.64 kg/m     Wt Readings from Last 3 Encounters:  08/24/23 202 lb 3.2 oz (91.7 kg)  12/05/22 222 lb 0.1 oz (100.7 kg)  11/08/22 224 lb 13.9 oz (102 kg)     GEN:  Well nourished, well developed in no acute distress HEENT: Normal NECK: No JVD; No carotid bruits CARDIAC: RRR, no murmurs, rubs, gallops RESPIRATORY:  Clear to auscultation without rales, wheezing or rhonchi  ABDOMEN: Soft, non-tender, non-distended MUSCULOSKELETAL:  No edema; No deformity  SKIN: Warm and dry NEUROLOGIC:  Alert and oriented x 3 PSYCHIATRIC:  Normal affect   ASSESSMENT:    1. Orthostatic hypotension   2. Pure hypercholesterolemia    PLAN:    In order of problems listed above:  Orthostatic hypotension, 26 point drop in systolic BP from sitting to standing in the office today.  Patient has a history of hypertension.  Will start midodrine 2.5 mg in a.m. and p.m. only.  Advised to check BP at home.  Okay to return blood pressures run high, up to 150s systolic okay.  Continue to hold valsartan. Hyperlipidemia, continue Crestor.  Follow-up in 6 weeks.      Medication Adjustments/Labs and Tests Ordered: Current medicines are reviewed at length with the patient today.  Concerns regarding medicines are outlined above.  Orders Placed This Encounter  Procedures   EKG 12-Lead   Meds ordered this encounter  Medications   midodrine (PROAMATINE) 2.5 MG tablet    Sig: Take 1 tablet (2.5 mg total) by mouth 2 (two) times daily with a meal.    Dispense:  180 tablet    Refill:  3    Patient Instructions  Medication Instructions:  Your physician recommends the following medication changes.  START TAKING: Midodrine 2.5 mg in the morning and  evening  *If you need a refill on your cardiac medications before your next appointment, please call your pharmacy*   Lab Work: None ordered at this time  If you have labs (blood work) drawn today and your tests are completely normal, you will receive your results only by: MyChart Message (if you have MyChart) OR A paper copy in the mail If you have any lab test that is abnormal or we need to change your treatment, we will call you to review the results.   Follow-Up: At [facility], you and your health needs are our priority.  As part of our continuing mission to provide you with exceptional heart care, we have created designated Provider Care Teams.  These Care Teams include your primary Cardiologist (physician) and Advanced Practice Providers (APPs -  Physician Assistants and Nurse Practitioners) who all work together to provide you with the care you need, when you need it.  We recommend signing up for the patient portal called "MyChart".  Sign up information is provided on this After Visit Summary.  MyChart is used to connect with patients for Virtual Visits (Telemedicine).  Patients are able to view lab/test results, encounter notes, upcoming appointments, etc.  Non-urgent messages can be sent to your provider as well.   To learn more about what you can do with MyChart, go to [email or web address].    Your next appointment:   6-8 week(s)  Provider:   You may see Dr Debbe Odea or one of the following Advanced Practice Providers on your designated Care Team:   Nicolasa Ducking, NP Eula Listen, PA-C Cadence Fransico Michael, PA-C Charlsie Quest, NP Carlos Levering, NP       Signed, Debbe Odea, MD  08/24/2023 1:17 PM    [facility]

## 2023-10-10 ENCOUNTER — Ambulatory Visit: Payer: PPO | Attending: Cardiology | Admitting: Cardiology

## 2023-10-10 ENCOUNTER — Encounter: Payer: Self-pay | Admitting: Cardiology

## 2023-10-10 VITALS — BP 140/64 | HR 84 | Temp 84.0°F | Ht 66.0 in | Wt 201.4 lb

## 2023-10-10 DIAGNOSIS — I951 Orthostatic hypotension: Secondary | ICD-10-CM

## 2023-10-10 DIAGNOSIS — E7800 Pure hypercholesterolemia, unspecified: Secondary | ICD-10-CM

## 2023-10-10 MED ORDER — MIDODRINE HCL 2.5 MG PO TABS: 2.5000 mg | ORAL_TABLET | Freq: Two times a day (BID) | ORAL | 3 refills | Status: AC

## 2023-10-10 NOTE — Patient Instructions (Signed)
 Medication Instructions:   Your Physician recommend you continue on your current medication as directed.     *If you need a refill on your cardiac medications before your next appointment, please call your pharmacy*  Lab Work:  None ordered.   If you have labs (blood work) drawn today and your tests are completely normal, you will receive your results only by: MyChart Message (if you have MyChart) OR A paper copy in the mail If you have any lab test that is abnormal or we need to change your treatment, we will call you to review the results.  Testing/Procedures:  None ordered.   Follow-Up: At [facility], you and your health needs are our priority.  As part of our continuing mission to provide you with exceptional heart care, our providers are all part of one team.  This team includes your primary Cardiologist (physician) and Advanced Practice Providers or APPs (Physician Assistants and Nurse Practitioners) who all work together to provide you with the care you need, when you need it.  Your next appointment:   6 month(s)  Provider:   You may see Dr. Azucena Cecil or one of the following Advanced Practice Providers on your designated Care Team:   Nicolasa Ducking, NP Ames Dura, PA-C Eula Listen, PA-C Cadence Osceola, PA-C Charlsie Quest, NP Carlos Levering, NP    We recommend signing up for the patient portal called "MyChart".  Sign up information is provided on this After Visit Summary.  MyChart is used to connect with patients for Virtual Visits (Telemedicine).  Patients are able to view lab/test results, encounter notes, upcoming appointments, etc.  Non-urgent messages can be sent to your provider as well.   To learn more about what you can do with MyChart, go to [email or web address].

## 2023-10-10 NOTE — Progress Notes (Signed)
 Cardiology Office Note:    Date:  10/10/2023   ID:  Kaitlin Kelley, DOB Sep 18, 1950, MRN [id number]  PCP:  Elissa Hefty, MD   [facility] Providers Cardiologist:  None     Referring MD: Elissa Hefty, MD   Chief Complaint  Patient presents with   Follow-up    Overall feels pretty well     History of Present Illness:    Kaitlin Kelley is a 73 y.o. female with a hx of orthostatic hypotension, diabetes, hyperlipidemia  presenting for follow-up.  Previously seen due to orthostatic hypotension.  Started on midodrine 2.5 mg twice daily to help with symptoms of dizziness, orthostasis.  She feels much better with starting midodrine.  Denies dizziness or falls.  Blood pressures at home run in the 140s systolic.  Overall feels much improved.  No concerns at this time.  Prior notes/testing Echo 10/24 EF 55 to 60%. Lexiscan Myoview 10/24 showed no ischemia.  Past Medical History:  Diagnosis Date   Arthritis    Chronic cough    Depression    Diabetes mellitus without complication (HCC)    Edema    LEGS/FEET   GERD (gastroesophageal reflux disease)    NO MEDS   Headache    H/O MIGRAINES   HOH (hard of hearing)    Hypertension    Hypothyroidism    IBS (irritable bowel syndrome)    Sleep apnea    CPAP    Past Surgical History:  Procedure Laterality Date   EYE SURGERY     GAS INSERTION Left 03/17/2015   Procedure: INSERTION OF GAS;  Surgeon: Marcelene Butte, MD;  Location: [facility];  Service: Ophthalmology;  Laterality: Left;  [id number]   GASTRIC BYPASS     HAND SURGERY     THUMB AND PINKY FINGER    INTRAMEDULLARY (IM) NAIL INTERTROCHANTERIC Left 12/05/2022   Procedure: INTRAMEDULLARY (IM) NAIL INTERTROCHANTERIC;  Surgeon: Christena Flake, MD;  Location: [facility];  Service: Orthopedics;  Laterality: Left;   PARS PLANA VITRECTOMY Left 03/17/2015   Procedure: PARS PLANA VITRECTOMY WITH 25 GAUGE;  Surgeon: Marcelene Butte, MD;  Location: [facility];  Service:  Ophthalmology;  Laterality: Left;   SHOULDER ARTHROSCOPY     TONSILLECTOMY     TUBAL LIGATION     VIDEO BRONCHOSCOPY WITH ENDOBRONCHIAL NAVIGATION N/A 03/05/2020   Procedure: VIDEO BRONCHOSCOPY WITH ENDOBRONCHIAL NAVIGATION;  Surgeon: Vida Rigger, MD;  Location: [facility];  Service: Thoracic;  Laterality: N/A;    Current Medications: Current Meds  Medication Sig   Bacillus Coagulans-Inulin (ALIGN PREBIOTIC-PROBIOTIC PO) Take 1 capsule by mouth daily.   calcium-vitamin D (OSCAL WITH D) 500-5 MG-MCG tablet Take 1 tablet by mouth. 1000mg    Cholecalciferol 50 MCG (2000 UT) CAPS Take 2,000 Units by mouth daily.    Collagen-Vitamin C-Biotin (COLLAGEN 1500/C) 500-50-0.8 MG CAPS daily.   Cyanocobalamin 3000 MCG CAPS Take 3,000 mcg by mouth daily.    dicyclomine (BENTYL) 10 MG capsule Take 10 mg by mouth 4 (four) times daily as needed for spasms.    dorzolamide (TRUSOPT) 2 % ophthalmic solution Place 1 drop into both eyes in the morning and at bedtime.    DULoxetine (CYMBALTA) 60 MG capsule Take 60 mg by mouth 2 (two) times daily.    ferrous gluconate (FERGON) 240 (27 FE) MG tablet Take by mouth.   folic acid (FOLVITE) 1 MG tablet Take 1 mg by mouth daily.   gabapentin (NEURONTIN) 300 MG capsule Take 300 mg by mouth 3 (  three) times daily.   Insulin Glargine-Lixisenatide (SOLIQUA) 100-33 UNT-MCG/ML SOPN Inject into the skin.   insulin lispro (HUMALOG) 100 UNIT/ML KwikPen INJECT 6 UNITS WITH SNACK AND 11 UNITS EACH MEAL PLUS CORR OF 1 UNIT PER 35 POINTS WHEN BS >150 (UP TO 50 UNITS DAILY WHEN INSTRUCTED)   Lancets 28G MISC    levothyroxine (SYNTHROID) 75 MCG tablet Take 1 tablet by mouth daily.   lidocaine 4 % Place onto the skin.   methotrexate (RHEUMATREX) 2.5 MG tablet TAKE 3 TABLETS (7.5 MG TOTAL) BY MOUTH EVERY 7 (SEVEN) DAYS   rosuvastatin (CRESTOR) 40 MG tablet Take 1 tablet by mouth daily.   [DISCONTINUED] midodrine (PROAMATINE) 2.5 MG tablet Take 1 tablet (2.5 mg total) by mouth 2 (two)  times daily with a meal.     Allergies:   Patient has no known allergies.   Social History   Socioeconomic History   Marital status: Widowed    Spouse name: Not on file   Number of children: Not on file   Years of education: Not on file   Highest education level: Not on file  Occupational History   Not on file  Tobacco Use   Smoking status: Never   Smokeless tobacco: Never  Vaping Use   Vaping status: Never Used  Substance and Sexual Activity   Alcohol use: Not Currently    Comment: RARE   Drug use: No   Sexual activity: Not on file  Other Topics Concern   Not on file  Social History Narrative   Lives in [place] with husband; 2 sons; never smoked; rare alcohol; customer service; 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    Social Drivers of Health   Financial Resource Strain: Medium Risk (08/14/2023)   Received from [facility]   Overall Financial Resource Strain (CARDIA)    Difficulty of Paying Living Expenses: Somewhat hard  Food Insecurity: No Food Insecurity (08/14/2023)   Received from [facility] [place]   Hunger Vital Sign    Worried About Running Out of Food in the Last Year: Never true    Ran Out of Food in the Last Year: Never true  Transportation Needs: No Transportation Needs (08/14/2023)   [facility]    Lack of Transportation (Medical): No    Lack of Transportation (Non-Medical): No  Physical Activity: Unknown (08/14/2023)   Received from [facility]   Exercise Vital Sign    Days of Exercise per Week: 0 days    Minutes of Exercise per Session: Not on file  Stress: No Stress Concern Present (08/14/2023)   Received from [facility] - Occupational Stress Questionnaire    Feeling of Stress : Not at all  Social Connections: Somewhat Isolated (08/14/2023)   Received from [facility]   Social Network    How would you rate your social network (family, work, friends)?: Restricted  participation with some degree of social isolation     Family History: The patient's family history includes Brain cancer in her cousin; Breast cancer in her paternal aunt; Diabetes in her brother and father; Heart disease in her mother; Leukemia in her brother; Macular degeneration in her niece; Prostate cancer in her brother.  ROS:   Please see the history of present illness.     All other systems reviewed and are negative.  EKGs/Labs/Other Studies Reviewed:    The following studies were reviewed today:       Recent  Labs: 12/06/2022: ALT 12 12/07/2022: TSH 0.744 12/11/2022: BUN 24; Creatinine, Ser 1.20; Hemoglobin 7.6; Platelets 284; Potassium 3.9; Sodium 137  Recent Lipid Panel No results found for: "CHOL", "TRIG", "HDL", "CHOLHDL", "VLDL", "LDLCALC", "LDLDIRECT"   Risk Assessment/Calculations:         Physical Exam:    VS:  BP (!) 140/64 (BP Location: Left Arm, Patient Position: Sitting, Cuff Size: Normal)   Pulse 84   Temp (!) 84 F (28.9 C)   Ht 5\' 6"  (1.676 m)   Wt 201 lb 6.4 oz (91.4 kg)   SpO2 97%   BMI 32.51 kg/m     Wt Readings from Last 3 Encounters:  10/10/23 201 lb 6.4 oz (91.4 kg)  08/24/23 202 lb 3.2 oz (91.7 kg)  12/05/22 222 lb 0.1 oz (100.7 kg)     GEN:  Well nourished, well developed in no acute distress HEENT: Normal NECK: No JVD; No carotid bruits CARDIAC: RRR, no murmurs, rubs, gallops RESPIRATORY:  Clear to auscultation without rales, wheezing or rhonchi  ABDOMEN: Soft, non-tender, non-distended MUSCULOSKELETAL:  No edema; No deformity  SKIN: Warm and dry NEUROLOGIC:  Alert and oriented x 3 PSYCHIATRIC:  Normal affect   ASSESSMENT:    1. Orthostatic hypotension   2. Pure hypercholesterolemia    PLAN:    In order of problems listed above:  Orthostatic hypotension, improved/resolved with starting midodrine.  Continue midodrine 2.5 mg in a.m. and p.m.   Hyperlipidemia, continue Crestor.  Follow-up in 6 months      Medication  Adjustments/Labs and Tests Ordered: Current medicines are reviewed at length with the patient today.  Concerns regarding medicines are outlined above.  No orders of the defined types were placed in this encounter.  Meds ordered this encounter  Medications   midodrine (PROAMATINE) 2.5 MG tablet    Sig: Take 1 tablet (2.5 mg total) by mouth 2 (two) times daily with a meal.    Dispense:  180 tablet    Refill:  3    Patient Instructions  Medication Instructions:   Your Physician recommend you continue on your current medication as directed.     *If you need a refill on your cardiac medications before your next appointment, please call your pharmacy*  Lab Work:  None ordered.   If you have labs (blood work) drawn today and your tests are completely normal, you will receive your results only by: MyChart Message (if you have MyChart) OR A paper copy in the mail If you have any lab test that is abnormal or we need to change your treatment, we will call you to review the results.  Testing/Procedures:  None ordered.   Follow-Up: At [facility], you and your health needs are our priority.  As part of our continuing mission to provide you with exceptional heart care, our providers are all part of one team.  This team includes your primary Cardiologist (physician) and Advanced Practice Providers or APPs (Physician Assistants and Nurse Practitioners) who all work together to provide you with the care you need, when you need it.  Your next appointment:   6 month(s)  Provider:   You may see Dr. Azucena Cecil or one of the following Advanced Practice Providers on your designated Care Team:   Nicolasa Ducking, NP Ames Dura, PA-C Eula Listen, PA-C Cadence Harlem, PA-C Charlsie Quest, NP Carlos Levering, NP    We recommend signing up for the patient portal called "MyChart".  Sign up information is provided on this After  Visit Summary.  MyChart is used to connect with patients  for Virtual Visits (Telemedicine).  Patients are able to view lab/test results, encounter notes, upcoming appointments, etc.  Non-urgent messages can be sent to your provider as well.   To learn more about what you can do with MyChart, go to [email or web address].            Signed, Debbe Odea, MD  10/10/2023 10:32 AM    [facility]

## 2024-03-12 ENCOUNTER — Other Ambulatory Visit: Payer: Self-pay | Admitting: Pulmonary Disease

## 2024-03-12 DIAGNOSIS — J849 Interstitial pulmonary disease, unspecified: Secondary | ICD-10-CM

## 2024-03-12 DIAGNOSIS — G4733 Obstructive sleep apnea (adult) (pediatric): Secondary | ICD-10-CM

## 2024-04-03 ENCOUNTER — Encounter: Payer: Self-pay | Admitting: Cardiology

## 2024-04-03 ENCOUNTER — Ambulatory Visit: Attending: Cardiology | Admitting: Cardiology

## 2024-04-03 VITALS — BP 129/80 | HR 76 | Ht 66.0 in | Wt 201.2 lb

## 2024-04-03 DIAGNOSIS — I951 Orthostatic hypotension: Secondary | ICD-10-CM | POA: Diagnosis not present

## 2024-04-03 DIAGNOSIS — E7800 Pure hypercholesterolemia, unspecified: Secondary | ICD-10-CM | POA: Diagnosis not present

## 2024-04-03 NOTE — Patient Instructions (Signed)
 Medication Instructions:  Your physician recommends that you continue on your current medications as directed. Please refer to the Current Medication list given to you today.   *If you need a refill on your cardiac medications before your next appointment, please call your pharmacy*  Lab Work: No labs ordered today  If you have labs (blood work) drawn today and your tests are completely normal, you will receive your results only by: MyChart Message (if you have MyChart) OR A paper copy in the mail If you have any lab test that is abnormal or we need to change your treatment, we will call you to review the results.  Testing/Procedures: No test ordered today   Follow-Up: At [facility], you and your health needs are our priority.  As part of our continuing mission to provide you with exceptional heart care, our providers are all part of one team.  This team includes your primary Cardiologist (physician) and Advanced Practice Providers or APPs (Physician Assistants and Nurse Practitioners) who all work together to provide you with the care you need, when you need it.  Your next appointment:   AS NEEDED

## 2024-04-03 NOTE — Progress Notes (Signed)
 Cardiology Office Note:    Date:  04/03/2024   ID:  Kaitlin Kelley, DOB 1950-09-22, MRN [id number]  PCP:  Heddy Norleen ORN, MD   [facility] Providers Cardiologist:  None     Referring MD: Heddy Norleen ORN, MD   Chief Complaint  Patient presents with   Follow-up    6 month follow up visit. Patient states that she has some shortness of breath from walking from the parking lot. Meds reviewed.     History of Present Illness:    Kaitlin Kelley is a 73 y.o. female with a hx of orthostatic hypotension, diabetes, hyperlipidemia  presenting for follow-up.  Previously seen due to orthostatic hypotension.  Was started on midodrine  2.5 mg twice daily with improvement in symptoms.  However, her blood pressures came to elevated and she stopped midodrine .  Blood pressures have been well-controlled off midodrine .  Feels well, denies any cardiac concerns at this time.  Prior notes/testing Echo 10/24 EF 55 to 60%. Lexiscan Myoview 10/24 showed no ischemia.  Past Medical History:  Diagnosis Date   Arthritis    Chronic cough    Depression    Diabetes mellitus without complication (HCC)    Edema    LEGS/FEET   GERD (gastroesophageal reflux disease)    NO MEDS   Headache    H/O MIGRAINES   HOH (hard of hearing)    Hypertension    Hypothyroidism    IBS (irritable bowel syndrome)    Sleep apnea    CPAP    Past Surgical History:  Procedure Laterality Date   EYE SURGERY     GAS INSERTION Left 03/17/2015   Procedure: INSERTION OF GAS;  Surgeon: Harlene Scarce, MD;  Location: [facility];  Service: Ophthalmology;  Laterality: Left;  [id number]   GASTRIC BYPASS     HAND SURGERY     THUMB AND PINKY FINGER    INTRAMEDULLARY (IM) NAIL INTERTROCHANTERIC Left 12/05/2022   Procedure: INTRAMEDULLARY (IM) NAIL INTERTROCHANTERIC;  Surgeon: Edie Norleen PARAS, MD;  Location: [facility];  Service: Orthopedics;  Laterality: Left;   PARS PLANA VITRECTOMY Left 03/17/2015   Procedure: PARS PLANA  VITRECTOMY WITH 25 GAUGE;  Surgeon: Harlene Scarce, MD;  Location: [facility];  Service: Ophthalmology;  Laterality: Left;   SHOULDER ARTHROSCOPY     TONSILLECTOMY     TUBAL LIGATION     VIDEO BRONCHOSCOPY WITH ENDOBRONCHIAL NAVIGATION N/A 03/05/2020   Procedure: VIDEO BRONCHOSCOPY WITH ENDOBRONCHIAL NAVIGATION;  Surgeon: Parris Manna, MD;  Location: [facility] OR[facility];  Service: Thoracic;  Laterality: N/A;    Current Medications: Current Meds  Medication Sig   azelastine (ASTELIN) 0.1 % nasal spray Place 1 spray into both nostrils as needed for allergies.   Bacillus Coagulans-Inulin (ALIGN PREBIOTIC-PROBIOTIC PO) Take 1 capsule by mouth daily.   calcium -vitamin D (OSCAL WITH D) 500-5 MG-MCG tablet Take 1 tablet by mouth. 1000mg    Cholecalciferol  50 MCG (2000 UT) CAPS Take 2,000 Units by mouth daily.    Collagen-Vitamin C-Biotin (COLLAGEN 1500/C) 500-50-0.8 MG CAPS daily.   Cyanocobalamin 3000 MCG CAPS Take 3,000 mcg by mouth daily.    dicyclomine  (BENTYL ) 10 MG capsule Take 10 mg by mouth 4 (four) times daily as needed for spasms.    docusate sodium  (COLACE) 100 MG capsule Take 1 capsule (100 mg total) by mouth 2 (two) times daily. (Patient taking differently: Take 100 mg by mouth as needed.)   dorzolamide (TRUSOPT) 2 % ophthalmic solution Place 1 drop into both eyes in the morning and  at bedtime.    DULoxetine  (CYMBALTA ) 60 MG capsule Take 60 mg by mouth 2 (two) times daily.    ferrous gluconate (FERGON) 240 (27 FE) MG tablet Take by mouth.   folic acid (FOLVITE) 1 MG tablet Take 1 mg by mouth daily.   gabapentin  (NEURONTIN ) 300 MG capsule Take 300 mg by mouth 3 (three) times daily.   Insulin  Glargine-Lixisenatide (SOLIQUA) 100-33 UNT-MCG/ML SOPN Inject into the skin. (Patient taking differently: Inject 24 Units into the skin at bedtime.)   insulin  lispro (HUMALOG) 100 UNIT/ML KwikPen INJECT 6 UNITS WITH SNACK AND 11 UNITS EACH MEAL PLUS CORR OF 1 UNIT PER 35 POINTS WHEN BS >150 (UP TO 50 UNITS  DAILY WHEN INSTRUCTED)   Lactobacillus 0.05-0.05 MG TABS Take by mouth.   Lancets 28G MISC    levothyroxine  (SYNTHROID ) 75 MCG tablet Take 1 tablet by mouth daily.   methotrexate (RHEUMATREX) 2.5 MG tablet TAKE 3 TABLETS (7.5 MG TOTAL) BY MOUTH EVERY 7 (SEVEN) DAYS   rosuvastatin  (CRESTOR ) 40 MG tablet Take 1 tablet by mouth daily.     Allergies:   Patient has no known allergies.   Social History   Socioeconomic History   Marital status: Widowed    Spouse name: Not on file   Number of children: Not on file   Years of education: Not on file   Highest education level: Not on file  Occupational History   Not on file  Tobacco Use   Smoking status: Never   Smokeless tobacco: Never  Vaping Use   Vaping status: Never Used  Substance and Sexual Activity   Alcohol use: Not Currently    Comment: RARE   Drug use: No   Sexual activity: Not on file  Other Topics Concern   Not on file  Social History Narrative   Lives in [place] with husband; 2 sons; never smoked; rare alcohol; customer service; 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    Social Drivers of Corporate investment banker Strain: Low Risk  (12/03/2023)   Received from [facility]   Overall Financial Resource Strain (CARDIA)    Difficulty of Paying Living Expenses: Not very hard  Food Insecurity: No Food Insecurity (12/03/2023)   Received from [facility]   Hunger Vital Sign    Within the past 12 months, you worried that your food would run out before you got the money to buy more.: Never true    Within the past 12 months, the food you bought just didn't last and you didn't have money to get more.: Never true  Transportation Needs: No Transportation Needs (12/03/2023)   Received from [facility] - Transportation    Lack of Transportation (Medical): No    Lack of Transportation (Non-Medical): No  Physical Activity: Unknown (12/03/2023)   Received from [facility]   Exercise Vital Sign    On average, how  many days per week do you engage in moderate to strenuous exercise (like a brisk walk)?: 0 days    Minutes of Exercise per Session: Not on file  Stress: No Stress Concern Present (12/03/2023)   Received from [facility] - Occupational Stress Questionnaire    Feeling of Stress : Not at all  Social Connections: Somewhat Isolated (12/03/2023)   Received from [facility]   Social Network    How would you rate your social network (family, work, friends)?: Restricted participation with some degree of social isolation  Family History: The patient's family history includes Brain cancer in her cousin; Breast cancer in her paternal aunt; Diabetes in her brother and father; Heart disease in her mother; Leukemia in her brother; Macular degeneration in her niece; Prostate cancer in her brother.  ROS:   Please see the history of present illness.     All other systems reviewed and are negative.  EKGs/Labs/Other Studies Reviewed:    The following studies were reviewed today:  EKG Interpretation Date/Time:  Thursday April 03 2024 11:30:30 EDT Ventricular Rate:  76 PR Interval:  146 QRS Duration:  84 QT Interval:  362 QTC Calculation: 407 R Axis:   -18  Text Interpretation: Normal sinus rhythm Minimal voltage criteria for LVH, may be normal variant ( R in aVL ) T wave abnormality, consider lateral ischemia Confirmed by Darliss Rogue ([id number]) on 04/03/2024 11:47:15 AM    Recent Labs: No results found for requested labs within last 365 days.  Recent Lipid Panel No results found for: CHOL, TRIG, HDL, CHOLHDL, VLDL, LDLCALC, LDLDIRECT   Risk Assessment/Calculations:         Physical Exam:    VS:  BP 129/80   Pulse 76   Ht 5' 6 (1.676 m)   Wt 201 lb 3.2 oz (91.3 kg)   SpO2 96%   BMI 32.47 kg/m     Wt Readings from Last 3 Encounters:  04/03/24 201 lb 3.2 oz (91.3 kg)  10/10/23 201 lb 6.4 oz (91.4 kg)  08/24/23 202  lb 3.2 oz (91.7 kg)     GEN:  Well nourished, well developed in no acute distress HEENT: Normal NECK: No JVD; No carotid bruits CARDIAC: RRR, no murmurs, rubs, gallops RESPIRATORY:  Clear to auscultation without rales, wheezing or rhonchi  ABDOMEN: Soft, non-tender, non-distended MUSCULOSKELETAL:  No edema; No deformity  SKIN: Warm and dry NEUROLOGIC:  Alert and oriented x 3 PSYCHIATRIC:  Normal affect   ASSESSMENT:    1. Orthostatic hypotension   2. Pure hypercholesterolemia    PLAN:    In order of problems listed above:  Orthostatic hypotension, initially improved/resolved with starting midodrine  2.5 mg twice daily.  However, BP became elevated and midodrine  was stopped.  BP controlled off midodrine .  Continue to monitor symptoms, no indication for hypotensive medications at this time. Hyperlipidemia, continue Crestor .  Follow-up as needed      Medication Adjustments/Labs and Tests Ordered: Current medicines are reviewed at length with the patient today.  Concerns regarding medicines are outlined above.  Orders Placed This Encounter  Procedures   EKG 12-Lead   No orders of the defined types were placed in this encounter.   There are no Patient Instructions on file for this visit.   Signed, Rogue Darliss, MD  04/03/2024 11:48 AM    [facility]

## 2024-04-08 ENCOUNTER — Ambulatory Visit
Admission: RE | Admit: 2024-04-08 | Discharge: 2024-04-08 | Disposition: A | Discharge status: Home / Self Care | Source: Ambulatory Visit | Attending: Pulmonary Disease | Admitting: Pulmonary Disease

## 2024-04-08 DIAGNOSIS — G4733 Obstructive sleep apnea (adult) (pediatric): Secondary | ICD-10-CM | POA: Insufficient documentation

## 2024-04-08 DIAGNOSIS — J849 Interstitial pulmonary disease, unspecified: Secondary | ICD-10-CM | POA: Insufficient documentation
